# Patient Record
Sex: Female | Born: 1955
Health system: Southern US, Community
[De-identification: ages and names within clinical notes are randomized; demographics above are authoritative.]

## PROBLEM LIST (undated history)

## (undated) DIAGNOSIS — C801 Malignant (primary) neoplasm, unspecified: Secondary | ICD-10-CM

## (undated) DIAGNOSIS — M549 Dorsalgia, unspecified: Secondary | ICD-10-CM

## (undated) DIAGNOSIS — F419 Anxiety disorder, unspecified: Secondary | ICD-10-CM

## (undated) DIAGNOSIS — K562 Volvulus: Secondary | ICD-10-CM

## (undated) DIAGNOSIS — M1990 Unspecified osteoarthritis, unspecified site: Secondary | ICD-10-CM

## (undated) DIAGNOSIS — G8929 Other chronic pain: Secondary | ICD-10-CM

## (undated) HISTORY — PX: ELBOW SURGERY: SHX618

## (undated) HISTORY — PX: SKIN CANCER EXCISION: SHX779

## (undated) HISTORY — PX: AUGMENTATION MAMMAPLASTY: SUR837

## (undated) HISTORY — DX: Malignant (primary) neoplasm, unspecified: C80.1

## (undated) HISTORY — PX: TUBAL LIGATION: SHX77

## (undated) HISTORY — DX: Anxiety disorder, unspecified: F41.9

## (undated) HISTORY — PX: OTHER SURGICAL HISTORY: SHX169

## (undated) HISTORY — DX: Unspecified osteoarthritis, unspecified site: M19.90

## (undated) HISTORY — PX: CHOLECYSTECTOMY: SHX55

## (undated) HISTORY — DX: Volvulus: K56.2

## (undated) HISTORY — PX: OVARIAN CYST REMOVAL: SHX89

---

## 2002-02-07 ENCOUNTER — Encounter: Payer: Self-pay | Admitting: Emergency Medicine

## 2002-02-07 ENCOUNTER — Ambulatory Visit (HOSPITAL_COMMUNITY): Admission: RE | Admit: 2002-02-07 | Discharge: 2002-02-07 | Payer: Self-pay | Admitting: Emergency Medicine

## 2002-02-21 ENCOUNTER — Ambulatory Visit (HOSPITAL_COMMUNITY): Admission: RE | Admit: 2002-02-21 | Discharge: 2002-02-21 | Payer: Self-pay | Admitting: Obstetrics and Gynecology

## 2002-02-21 ENCOUNTER — Encounter (INDEPENDENT_AMBULATORY_CARE_PROVIDER_SITE_OTHER): Payer: Self-pay | Admitting: *Deleted

## 2002-04-28 ENCOUNTER — Emergency Department (HOSPITAL_COMMUNITY): Admission: EM | Admit: 2002-04-28 | Discharge: 2002-04-28 | Payer: Self-pay | Admitting: Emergency Medicine

## 2002-04-28 ENCOUNTER — Encounter: Payer: Self-pay | Admitting: Emergency Medicine

## 2002-11-06 ENCOUNTER — Other Ambulatory Visit: Admission: RE | Admit: 2002-11-06 | Discharge: 2002-11-06 | Payer: Self-pay | Admitting: Obstetrics and Gynecology

## 2003-05-15 ENCOUNTER — Encounter: Admission: RE | Admit: 2003-05-15 | Discharge: 2003-05-15 | Payer: Self-pay | Admitting: Obstetrics and Gynecology

## 2003-05-21 ENCOUNTER — Encounter: Admission: RE | Admit: 2003-05-21 | Discharge: 2003-05-21 | Payer: Self-pay | Admitting: Obstetrics and Gynecology

## 2003-11-12 ENCOUNTER — Other Ambulatory Visit: Admission: RE | Admit: 2003-11-12 | Discharge: 2003-11-12 | Payer: Self-pay | Admitting: Obstetrics and Gynecology

## 2004-05-08 HISTORY — PX: JOINT REPLACEMENT: SHX530

## 2004-11-14 ENCOUNTER — Ambulatory Visit (HOSPITAL_COMMUNITY): Admission: RE | Admit: 2004-11-14 | Discharge: 2004-11-14 | Payer: Self-pay | Admitting: Emergency Medicine

## 2004-12-07 ENCOUNTER — Other Ambulatory Visit: Admission: RE | Admit: 2004-12-07 | Discharge: 2004-12-07 | Payer: Self-pay | Admitting: Obstetrics and Gynecology

## 2005-03-23 ENCOUNTER — Ambulatory Visit (HOSPITAL_COMMUNITY): Admission: RE | Admit: 2005-03-23 | Discharge: 2005-03-23 | Payer: Self-pay | Admitting: Emergency Medicine

## 2005-09-30 ENCOUNTER — Ambulatory Visit (HOSPITAL_COMMUNITY): Admission: RE | Admit: 2005-09-30 | Discharge: 2005-09-30 | Payer: Self-pay | Admitting: Specialist

## 2006-02-05 ENCOUNTER — Encounter: Admission: RE | Admit: 2006-02-05 | Discharge: 2006-02-05 | Payer: Self-pay | Admitting: Obstetrics and Gynecology

## 2006-10-02 ENCOUNTER — Inpatient Hospital Stay (HOSPITAL_COMMUNITY): Admission: RE | Admit: 2006-10-02 | Discharge: 2006-10-03 | Payer: Self-pay | Admitting: Orthopedic Surgery

## 2006-11-19 ENCOUNTER — Encounter: Admission: RE | Admit: 2006-11-19 | Discharge: 2006-11-19 | Payer: Self-pay | Admitting: Emergency Medicine

## 2007-05-09 HISTORY — PX: WRIST SURGERY: SHX841

## 2007-06-08 ENCOUNTER — Emergency Department (HOSPITAL_COMMUNITY): Admission: EM | Admit: 2007-06-08 | Discharge: 2007-06-08 | Payer: Self-pay | Admitting: Emergency Medicine

## 2007-06-09 ENCOUNTER — Observation Stay (HOSPITAL_COMMUNITY): Admission: AD | Admit: 2007-06-09 | Discharge: 2007-06-10 | Payer: Self-pay | Admitting: Obstetrics & Gynecology

## 2007-06-13 ENCOUNTER — Encounter: Admission: RE | Admit: 2007-06-13 | Discharge: 2007-06-13 | Payer: Self-pay | Admitting: Emergency Medicine

## 2007-06-26 ENCOUNTER — Other Ambulatory Visit: Admission: RE | Admit: 2007-06-26 | Discharge: 2007-06-26 | Payer: Self-pay | Admitting: Interventional Radiology

## 2007-06-26 ENCOUNTER — Encounter: Admission: RE | Admit: 2007-06-26 | Discharge: 2007-06-26 | Payer: Self-pay | Admitting: Emergency Medicine

## 2007-06-26 ENCOUNTER — Encounter (INDEPENDENT_AMBULATORY_CARE_PROVIDER_SITE_OTHER): Payer: Self-pay | Admitting: Interventional Radiology

## 2007-09-22 ENCOUNTER — Emergency Department (HOSPITAL_COMMUNITY): Admission: EM | Admit: 2007-09-22 | Discharge: 2007-09-22 | Payer: Self-pay | Admitting: Emergency Medicine

## 2007-09-23 ENCOUNTER — Inpatient Hospital Stay (HOSPITAL_COMMUNITY): Admission: EM | Admit: 2007-09-23 | Discharge: 2007-09-25 | Payer: Self-pay | Admitting: Emergency Medicine

## 2007-09-23 ENCOUNTER — Ambulatory Visit: Payer: Self-pay | Admitting: Family Medicine

## 2007-10-12 ENCOUNTER — Ambulatory Visit (HOSPITAL_BASED_OUTPATIENT_CLINIC_OR_DEPARTMENT_OTHER): Admission: RE | Admit: 2007-10-12 | Discharge: 2007-10-12 | Payer: Self-pay | Admitting: Emergency Medicine

## 2007-11-21 ENCOUNTER — Emergency Department (HOSPITAL_COMMUNITY): Admission: EM | Admit: 2007-11-21 | Discharge: 2007-11-22 | Payer: Self-pay | Admitting: Emergency Medicine

## 2007-11-21 ENCOUNTER — Emergency Department (HOSPITAL_BASED_OUTPATIENT_CLINIC_OR_DEPARTMENT_OTHER): Admission: EM | Admit: 2007-11-21 | Discharge: 2007-11-21 | Payer: Self-pay | Admitting: Emergency Medicine

## 2008-04-20 ENCOUNTER — Ambulatory Visit: Payer: Self-pay | Admitting: Diagnostic Radiology

## 2008-04-20 ENCOUNTER — Ambulatory Visit (HOSPITAL_BASED_OUTPATIENT_CLINIC_OR_DEPARTMENT_OTHER): Admission: RE | Admit: 2008-04-20 | Discharge: 2008-04-20 | Payer: Self-pay | Admitting: Emergency Medicine

## 2008-08-19 ENCOUNTER — Ambulatory Visit (HOSPITAL_BASED_OUTPATIENT_CLINIC_OR_DEPARTMENT_OTHER): Admission: RE | Admit: 2008-08-19 | Discharge: 2008-08-19 | Payer: Self-pay | Admitting: Obstetrics and Gynecology

## 2008-08-19 ENCOUNTER — Ambulatory Visit: Payer: Self-pay | Admitting: Diagnostic Radiology

## 2008-09-07 ENCOUNTER — Encounter: Admission: RE | Admit: 2008-09-07 | Discharge: 2008-09-07 | Payer: Self-pay | Admitting: Gastroenterology

## 2009-03-05 ENCOUNTER — Emergency Department (HOSPITAL_BASED_OUTPATIENT_CLINIC_OR_DEPARTMENT_OTHER): Admission: EM | Admit: 2009-03-05 | Discharge: 2009-03-06 | Payer: Self-pay | Admitting: Emergency Medicine

## 2009-07-14 ENCOUNTER — Ambulatory Visit: Payer: Self-pay | Admitting: Interventional Radiology

## 2009-07-14 ENCOUNTER — Ambulatory Visit (HOSPITAL_BASED_OUTPATIENT_CLINIC_OR_DEPARTMENT_OTHER): Admission: RE | Admit: 2009-07-14 | Discharge: 2009-07-14 | Payer: Self-pay | Admitting: Emergency Medicine

## 2009-08-08 ENCOUNTER — Emergency Department (HOSPITAL_COMMUNITY): Admission: EM | Admit: 2009-08-08 | Discharge: 2009-08-08 | Payer: Self-pay | Admitting: Emergency Medicine

## 2009-11-06 ENCOUNTER — Ambulatory Visit (HOSPITAL_COMMUNITY): Admission: RE | Admit: 2009-11-06 | Discharge: 2009-11-06 | Payer: Self-pay | Admitting: Emergency Medicine

## 2009-12-06 HISTORY — PX: BREAST SURGERY: SHX581

## 2010-02-20 ENCOUNTER — Inpatient Hospital Stay (HOSPITAL_COMMUNITY): Admission: EM | Admit: 2010-02-20 | Discharge: 2010-03-02 | Payer: Self-pay | Admitting: Emergency Medicine

## 2010-02-20 ENCOUNTER — Encounter: Payer: Self-pay | Admitting: Emergency Medicine

## 2010-02-20 ENCOUNTER — Ambulatory Visit: Payer: Self-pay | Admitting: Diagnostic Radiology

## 2010-02-20 ENCOUNTER — Encounter (INDEPENDENT_AMBULATORY_CARE_PROVIDER_SITE_OTHER): Payer: Self-pay | Admitting: General Surgery

## 2010-02-20 HISTORY — PX: COLON SURGERY: SHX602

## 2010-03-05 ENCOUNTER — Ambulatory Visit: Payer: Self-pay | Admitting: Vascular Surgery

## 2010-03-05 ENCOUNTER — Ambulatory Visit (HOSPITAL_COMMUNITY): Admission: RE | Admit: 2010-03-05 | Discharge: 2010-03-05 | Payer: Self-pay | Admitting: General Surgery

## 2010-03-05 ENCOUNTER — Encounter (INDEPENDENT_AMBULATORY_CARE_PROVIDER_SITE_OTHER): Payer: Self-pay | Admitting: General Surgery

## 2010-03-06 ENCOUNTER — Emergency Department (HOSPITAL_COMMUNITY): Admission: EM | Admit: 2010-03-06 | Discharge: 2010-03-06 | Payer: Self-pay | Admitting: Emergency Medicine

## 2010-03-09 ENCOUNTER — Observation Stay (HOSPITAL_COMMUNITY)
Admission: EM | Admit: 2010-03-09 | Discharge: 2010-03-11 | Payer: Self-pay | Source: Home / Self Care | Admitting: Emergency Medicine

## 2010-05-29 ENCOUNTER — Encounter: Payer: Self-pay | Admitting: Emergency Medicine

## 2010-05-30 ENCOUNTER — Encounter: Payer: Self-pay | Admitting: Emergency Medicine

## 2010-07-19 LAB — COMPREHENSIVE METABOLIC PANEL
ALT: 16 U/L (ref 0–35)
ALT: 16 U/L (ref 0–35)
AST: 20 U/L (ref 0–37)
AST: 24 U/L (ref 0–37)
Albumin: 2.9 g/dL — ABNORMAL LOW (ref 3.5–5.2)
Albumin: 3.1 g/dL — ABNORMAL LOW (ref 3.5–5.2)
CO2: 21 mEq/L (ref 19–32)
Calcium: 8.9 mg/dL (ref 8.4–10.5)
Calcium: 9.1 mg/dL (ref 8.4–10.5)
Creatinine, Ser: 0.62 mg/dL (ref 0.4–1.2)
GFR calc Af Amer: >60 mL/min (ref >60)
GFR calc Af Amer: >60 mL/min (ref >60)
GFR calc non Af Amer: >60 mL/min (ref >60)
Sodium: 136 mEq/L (ref 135–145)
Sodium: 142 mEq/L (ref 135–145)
Total Protein: 6.3 g/dL (ref 6.0–8.3)
Total Protein: 6.3 g/dL (ref 6.0–8.3)

## 2010-07-19 LAB — MRSA CULTURE

## 2010-07-19 LAB — RETICULOCYTES
RBC.: 3.12 MIL/uL — ABNORMAL LOW (ref 3.87–5.11)
Retic Count, Absolute: 78 10*3/uL (ref 19.0–186.0)
Retic Ct Pct: 2.5 % (ref 0.4–3.1)

## 2010-07-19 LAB — CBC
Hemoglobin: 8.9 g/dL — ABNORMAL LOW (ref 12.0–15.0)
Hemoglobin: 9.4 g/dL — ABNORMAL LOW (ref 12.0–15.0)
MCH: 31.7 pg (ref 26.0–34.0)
MCHC: 33.4 g/dL (ref 30.0–36.0)
MCHC: 33.6 g/dL (ref 30.0–36.0)
Platelets: 401 10*3/uL — ABNORMAL HIGH (ref 150–400)
Platelets: 471 10*3/uL — ABNORMAL HIGH (ref 150–400)
RDW: 13.7 % (ref 11.5–15.5)

## 2010-07-19 LAB — DIFFERENTIAL
Eosinophils Absolute: 0.2 10*3/uL (ref 0.0–0.7)
Eosinophils Relative: 3 % (ref 0–5)
Lymphocytes Relative: 33 % (ref 12–46)
Lymphs Abs: 2.4 10*3/uL (ref 0.7–4.0)
Monocytes Relative: 7 % (ref 3–12)

## 2010-07-19 LAB — URINALYSIS, ROUTINE W REFLEX MICROSCOPIC
Ketones, ur: NEGATIVE mg/dL
Nitrite: NEGATIVE
Protein, ur: NEGATIVE mg/dL
Urobilinogen, UA: 0.2 mg/dL (ref 0.0–1.0)
pH: 5.5 (ref 5.0–8.0)

## 2010-07-19 LAB — FERRITIN: Ferritin: 70 ng/mL (ref 10–291)

## 2010-07-19 LAB — IRON AND TIBC: Iron: 14 ug/dL — ABNORMAL LOW (ref 42–135)

## 2010-07-19 LAB — VITAMIN B12: Vitamin B-12: 1524 pg/mL — ABNORMAL HIGH (ref 211–911)

## 2010-07-20 LAB — CBC
HCT: 29.8 % — ABNORMAL LOW (ref 36.0–46.0)
HCT: 25.9 % — ABNORMAL LOW (ref 36.0–46.0)
HCT: 27.7 % — ABNORMAL LOW (ref 36.0–46.0)
HCT: 32.9 % — ABNORMAL LOW (ref 36.0–46.0)
HCT: 40 % (ref 36.0–46.0)
Hemoglobin: 11.5 g/dL — ABNORMAL LOW (ref 12.0–15.0)
Hemoglobin: 13.7 g/dL (ref 12.0–15.0)
MCH: 30.4 pg (ref 26.0–34.0)
MCH: 33 pg (ref 26.0–34.0)
MCH: 33.1 pg (ref 26.0–34.0)
MCHC: 31.9 g/dL (ref 30.0–36.0)
MCHC: 34.4 g/dL (ref 30.0–36.0)
MCV: 95.5 fL (ref 78.0–100.0)
MCV: 95.5 fL (ref 78.0–100.0)
MCV: 95.8 fL (ref 78.0–100.0)
MCV: 95.9 fL (ref 78.0–100.0)
MCV: 96 fL (ref 78.0–100.0)
Platelets: 550 10*3/uL — ABNORMAL HIGH (ref 150–400)
Platelets: 127 10*3/uL — ABNORMAL LOW (ref 150–400)
Platelets: 156 10*3/uL (ref 150–400)
Platelets: 178 10*3/uL (ref 150–400)
Platelets: 262 10*3/uL (ref 150–400)
RBC: 2.7 MIL/uL — ABNORMAL LOW (ref 3.87–5.11)
RBC: 3.48 MIL/uL — ABNORMAL LOW (ref 3.87–5.11)
RDW: 13.4 % (ref 11.5–15.5)
RDW: 12.8 % (ref 11.5–15.5)
RDW: 13.1 % (ref 11.5–15.5)
RDW: 13.3 % (ref 11.5–15.5)
RDW: 12.4 % (ref 11.5–15.5)
WBC: 7.5 10*3/uL (ref 4.0–10.5)
WBC: 5.7 10*3/uL (ref 4.0–10.5)
WBC: 6.7 10*3/uL (ref 4.0–10.5)
WBC: 8.3 10*3/uL (ref 4.0–10.5)
WBC: 7.3 10*3/uL (ref 4.0–10.5)

## 2010-07-20 LAB — BASIC METABOLIC PANEL
BUN: 14 mg/dL (ref 6–23)
BUN: 8 mg/dL (ref 6–23)
BUN: 8 mg/dL (ref 6–23)
BUN: 8 mg/dL (ref 6–23)
BUN: 17 mg/dL (ref 6–23)
CO2: 25 mEq/L (ref 19–32)
CO2: 28 mEq/L (ref 19–32)
Calcium: 9.1 mg/dL (ref 8.4–10.5)
Chloride: 106 mEq/L (ref 96–112)
Chloride: 105 mEq/L (ref 96–112)
Chloride: 111 mEq/L (ref 96–112)
Chloride: 111 mEq/L (ref 96–112)
Chloride: 102 mEq/L (ref 96–112)
Creatinine, Ser: 1.09 mg/dL (ref 0.4–1.2)
Creatinine, Ser: 0.62 mg/dL (ref 0.4–1.2)
Creatinine, Ser: 0.64 mg/dL (ref 0.4–1.2)
Creatinine, Ser: 0.66 mg/dL (ref 0.4–1.2)
GFR calc Af Amer: >60 mL/min (ref >60)
GFR calc Af Amer: >60 mL/min (ref >60)
GFR calc Af Amer: >60 mL/min (ref >60)
GFR calc non Af Amer: >60 mL/min (ref >60)
GFR calc non Af Amer: >60 mL/min (ref >60)
GFR calc non Af Amer: >60 mL/min (ref >60)
GFR calc non Af Amer: >60 mL/min (ref >60)
GFR calc non Af Amer: >60 mL/min (ref >60)
Glucose, Bld: 93 mg/dL (ref 70–99)
Glucose, Bld: 109 mg/dL — ABNORMAL HIGH (ref 70–99)
Glucose, Bld: 108 mg/dL — ABNORMAL HIGH (ref 70–99)
Potassium: 4.2 mEq/L (ref 3.5–5.1)
Potassium: 3.1 mEq/L — ABNORMAL LOW (ref 3.5–5.1)
Potassium: 3.9 mEq/L (ref 3.5–5.1)
Potassium: 4.2 mEq/L (ref 3.5–5.1)
Potassium: 3.7 mEq/L (ref 3.5–5.1)
Sodium: 139 mEq/L (ref 135–145)
Sodium: 139 mEq/L (ref 135–145)
Sodium: 136 mEq/L (ref 135–145)

## 2010-07-20 LAB — DIFFERENTIAL
Basophils Absolute: 0 10*3/uL (ref 0.0–0.1)
Basophils Absolute: 0.1 10*3/uL (ref 0.0–0.1)
Eosinophils Absolute: 0.3 10*3/uL (ref 0.0–0.7)
Eosinophils Relative: 3 % (ref 0–5)
Eosinophils Relative: 8 % — ABNORMAL HIGH (ref 0–5)
Lymphocytes Relative: 39 % (ref 12–46)
Lymphocytes Relative: 39 % (ref 12–46)
Monocytes Absolute: 0.7 10*3/uL (ref 0.1–1.0)
Monocytes Absolute: 0.8 10*3/uL (ref 0.1–1.0)
Monocytes Relative: 10 % (ref 3–12)
Neutro Abs: 2.9 10*3/uL (ref 1.7–7.7)

## 2010-07-20 LAB — COMPREHENSIVE METABOLIC PANEL
ALT: 19 U/L (ref 0–35)
AST: 29 U/L (ref 0–37)
CO2: 23 mEq/L (ref 19–32)
Chloride: 105 mEq/L (ref 96–112)
GFR calc Af Amer: >60 mL/min (ref >60)
GFR calc non Af Amer: >60 mL/min (ref >60)
Glucose, Bld: 209 mg/dL — ABNORMAL HIGH (ref 70–99)
Sodium: 136 mEq/L (ref 135–145)
Total Bilirubin: 0.8 mg/dL (ref 0.3–1.2)

## 2010-07-20 LAB — URINALYSIS, ROUTINE W REFLEX MICROSCOPIC
Bilirubin Urine: NEGATIVE
Glucose, UA: NEGATIVE mg/dL
Hgb urine dipstick: NEGATIVE
Ketones, ur: NEGATIVE mg/dL
Specific Gravity, Urine: 1.012 (ref 1.005–1.030)
pH: 6 (ref 5.0–8.0)

## 2010-07-20 LAB — MRSA PCR SCREENING: MRSA by PCR: NEGATIVE

## 2010-07-20 LAB — POTASSIUM: Potassium: 3.7 mEq/L (ref 3.5–5.1)

## 2010-07-20 LAB — TYPE AND SCREEN

## 2010-08-11 LAB — WOUND CULTURE: Gram Stain: NONE SEEN

## 2010-08-11 LAB — CULTURE, BLOOD (ROUTINE X 2): Culture: NO GROWTH

## 2010-08-11 LAB — CBC
HCT: 44.4 % (ref 36.0–46.0)
Hemoglobin: 15.3 g/dL — ABNORMAL HIGH (ref 12.0–15.0)
RDW: 11.8 % (ref 11.5–15.5)
WBC: 11.5 10*3/uL — ABNORMAL HIGH (ref 4.0–10.5)

## 2010-08-11 LAB — DIFFERENTIAL
Basophils Absolute: 0 10*3/uL (ref 0.0–0.1)
Eosinophils Relative: 1 % (ref 0–5)
Lymphocytes Relative: 30 % (ref 12–46)
Lymphs Abs: 3.4 10*3/uL (ref 0.7–4.0)
Monocytes Absolute: 0.7 10*3/uL (ref 0.1–1.0)
Neutro Abs: 7.3 10*3/uL (ref 1.7–7.7)

## 2010-08-11 LAB — BASIC METABOLIC PANEL
Calcium: 9.6 mg/dL (ref 8.4–10.5)
GFR calc Af Amer: >60 mL/min (ref >60)
GFR calc non Af Amer: >60 mL/min (ref >60)
Potassium: 3.2 mEq/L — ABNORMAL LOW (ref 3.5–5.1)
Sodium: 151 mEq/L — ABNORMAL HIGH (ref 135–145)

## 2010-09-20 NOTE — H&P (Signed)
Beth Frey, MCCLARAN NO.:  [id number]   MEDICAL RECORD NO.:  [id number]          PATIENT TYPE:  INP   LOCATION:  2033                         FACILITY:  [facility]   PHYSICIAN:  Beth Frey, M.D.DATE OF BIRTH:  [birth year]   DATE OF ADMISSION:  09/23/2007  DATE OF DISCHARGE:                              HISTORY & PHYSICAL   PRIMARY CARE Shiloh Swopes:  Dr. Andee Poles at [facility].   CHIEF COMPLAINT:  Near syncope and increased blood pressure.   HISTORY OF PRESENTING ILLNESS:  The patient is a 55 year old previously  healthy woman who had an episode of lightheadedness yesterday while  she was singing in the church choir.  There was no loss of  consciousness, chest pain, or shortness of breath, but she did feel  nauseous during the episode.  Thinking that she might have low blood  sugar, she drank a Pepsi, but her symptoms did not improve.  A friend at  church checked her blood pressure, which showed a systolic of over 425.  EMS was called and found then her systolic to be between 160 and 180.  The patient was taken to [facility] where blood pressure was 193/105  and later found to be 132/81.  It was not clear if she received any  medicine contributing to the decrease of her blood pressure or if it  came down spontaneously.  Labs done at [facility] yesterday were within  normal limits with the exception of a potassium of 2.8 and EKG was  normal.  A UA was normal.  Head CT without contrast was normal with the  exception of mucosal thickening and partial opacification of the basal  aspect of the maxillary sinus.  Today, the patient had another episode  of lightheadedness and called her primary care Saran Laviolette, Dr. Andee Poles, who  instructed her to call EMS and EMS subsequently brought her to the  emergency department.   PAST MEDICAL HISTORY:  Goiter, but thyroid studies have always been  normal per the patient.  She also has a history of ovarian cyst; several  of which  have ruptured.   PAST SURGICAL HISTORY:  Include knee replacement, bilateral tubal  ligation, laparoscopy, uterine ablation, multiple basal and squamous  cell carcinomas have been removed from the skin.   MEDICINE ALLERGIES:  Vancomycin, which causes a rash.   CURRENT MEDICATIONS:  1. Fish oil 1 tablet daily.  2. Calcium 1 tablet daily.  3. Vitamin D 1 tablet daily.  4. Hormone replacement therapy called Angeliq; however, the patient      self-discontinued that several days ago.   SOCIAL HISTORY:  The patient is married.  Of note, she is married to ER  physician, Dr. Read Drivers.   OCCUPATION:  Has worked in the past as an Insurance underwriter.  No tobacco, no  drugs, and no alcohol.   FAMILY HISTORY:  Stomach cancer in the mother and a sibling with  multiple myeloma.   REVIEW OF SYSTEMS:  Positive for HPI and also positive for recent hair  loss, and some aching in her left arm, lower  back pain, and abdominal  bloating, otherwise review of systems is negative.   PHYSICAL EXAMINATION:  VITAL SIGNS:  Temperature ranging from 97.5 to  97.6, pulse 68 to 70, respirations 18 to 20.  Her initial blood pressure  was 140/70 and then decreased to 139/86.  Pulse ox was 100% on room air.  GENERAL:  The patient was anxious, but no acute distress.  HEENT:  Head was normocephalic, atraumatic.  Pupils were equally round  and reactive to light.  Extraocular movements were intact.  Her  oropharynx was clear.  Her neck was supple without lymphadenopathy.  CARDIAC:  Regular rate and rhythm with no murmurs, rubs, or gallops  noted.  LUNGS:  Clear to auscultation bilaterally.  ABDOMEN:  Soft.  There was mild tenderness to palpation in the  epigastrium.  No organomegaly.  Normoactive bowel sounds.  BACK:  No  scoliosis or other abnormalities.  EXTREMITIES:  No clubbing, cyanosis, or edema.  NEURO:  No focal deficit, normal gait, and normal strength.  The patient  was alert and oriented x3.  MUSCULOSKELETAL:   5/5 strength in the upper and lower extremities.   LABS AND STUDIES:  All of his labs are from yesterday from [facility],  include electrolytes which were within normal limits with the following  exception, potassium of 2.8, glucose of 137; however, this was not a  fasting glucose presumably.  CBC was within normal limits with a white  blood cell count of 6.6, hemoglobin of 14.1, hematocrit 40.9, and  platelets of 196.  UA shows specific gravity of 1.003, but was otherwise  within normal limits.  Point of care cardiac enzymes were negative.   ASSESSMENT/PLAN:  The patient is a 55 year old with lightheadedness and  labile blood pressure.  1. Lightheadedness and labile blood pressure.  2. Differential diagnosis includes pheochromocytoma, and vascular exam      such as stroke with reactionary elevation of blood pressure, panic      attacks, carcinoid syndrome or hyperadrenergic spells.  3. We will plan to check a 24-hour fractionated urine metanephrines      and catecholamines and urine creatinine to screen for      pheochromocytoma.  If those tests were positive, we will plan to      proceed with a CAT scan to look for pheochromocytoma.  4. We will also check an MRA/MRI to rule cardiovascular accident with      resultant increasing blood pressure.  5. We will place the patient on telemetry.  6. We will treat blood pressure with p.r.n. labetalol.  7. We will check a.m. labs including a CMP and TSH.  Anxiety, the      patient is anxious over the cause of her symptoms.  We will give      Ativan 1 mg t.i.d. p.r.n. for now.  8. Hypokalemia.  The patient was [facility]alemic [facility], we will replete that as necessary and recheck potassium in      the morning.      Asher Muir, MD  Electronically Signed      Beth Bumpers. Leveda Anna, M.D.  Electronically Signed    SO/MEDQ  D:  09/24/2007  T:  09/24/2007  Job:  [id number]

## 2010-09-20 NOTE — Consult Note (Signed)
NAME:  Beth Frey, Beth Frey NO.:  [id number]   MEDICAL RECORD NO.:  [id number]          PATIENT TYPE:  EMS   LOCATION:  ED                           FACILITY:  [facility]   PHYSICIAN:  Bernette Redbird, M.D.   DATE OF BIRTH:  1955-12-05   DATE OF CONSULTATION:  06/08/2007  DATE OF DISCHARGE:                                 CONSULTATION   REASON FOR CONSULTATION:  Dr. Bethann Frey of the [facility] emergency  room asked me to see this 55 year old female, the wife of Dr. Brock Bad of the ER staff, because of abdominal pain and an abnormal  radiographic appearance of the abdomen.   Beth Frey has generally enjoyed good health in the past but has had ruptured  ovarian cysts on 2 previous occasions, one of them necessitating surgery  with a Pfannenstiel incision many years ago.  She is 55 years old, has  been on long term hormone replacement therapy, and it is not really  clear whether or not she has been menopausal.   With that background, she was in her usual state of health until  approximately 2 days ago when she went to urgent medical care and was  started on lisinopril for elevated blood pressure.  That evening she  developed some slight abdominal discomfort and a few episodes of watery  diarrhea.  The next day (yesterday) she had a little more watery  diarrhea and the pain began, along with nausea, regurgitation, rumbling  in her abdomen, sense of bone aches and weakness, and by last night  she was having a steady progression in the intensity of the pain to the  point where finally it became excruciating.  It was sharp and rather  diffuse in character.  No associated fevers or chills.  She did not feel  like eating.  She came to the emergency room this morning and took a lot  of pain medication but finally, over the course of roughly 8 hours in  the emergency room, her pain has tapered off to the point where she is  feeling much better at this time.   In the meantime,  an abdominal pelvic CT was obtained which showed a fair  amount of pelvic free fluid without any obvious ovarian abnormality or  cyst identified.  No other abdominal pelvic abnormalities to account for  pain or diarrhea or fluid in the abdomen were observed.  I have reviewed  the CT with Dr. Eppie Gibson of radiology and pertinent negatives include the  absence of hepatic vein thrombosis, portal vein thrombosis, cirrhotic  changes in the liver, splenomegaly, colitis or bowel obstruction.  No  evidence of pancreatitis, no evidence of lymphadenopathy.   PAST MEDICAL HISTORY:  No known allergies (had a red man reaction to  vancomycin many years ago).  Outpatient medications include recently  started lisinopril, low dose hormone replacement therapy, calcium and  vitamin supplements, fish oil and multiple vitamins.  Operations include  a previous total knee replacement last May, tubal ligation, uterine  ablation with laparoscopy looking for endometriosis (apparently  negative), and an open  pelvic exploration many years ago for an apparent  ruptured ovarian cyst.  Medical illnesses include recently diagnosed  high blood pressure, no cardiopulmonary disease or diabetes.   HABITS:  Nonsmoker, nondrinker.   FAMILY HISTORY:  Her father had cancer of the stomach or  gastroesophageal junction but otherwise negative for GI illnesses such  as liver disease, celiac disease, inflammatory bowel disease, ulcers or  gallbladder trouble.   SOCIAL HISTORY:  The patient is a Engineer, civil (consulting).  At this time she is not  employed outside the home.  Her husband is Dr. Brock Bad of the  emergency room staff.  They have no children.   REVIEW OF SYSTEMS:  Negative for antecedent chronic intestinal symptoms  such as loss of appetite, involuntary weight loss, dysphagia, nausea,  abdominal pain, constipation or diarrhea.   PHYSICAL EXAMINATION:  GENERAL:  Beth Frey is a lean, healthy-appearing  female in no evident distress.  She  looks a little bit anxious but is  able to laugh gently.  She does appear slightly uncomfortable from  residual abdominal pain.  VITAL SIGNS:  During her day in the emergency room she has been  repeatedly afebrile.  Blood pressure has been running variable, at  142/112 on admission, more recently 114/59 and her heart rate has  dropped from 117-86 over that period of time, presumably as a result of  less pain.  HEENT:  She is anicteric and without pallor.  CHEST:  The chest is clear to auscultation.  HEART:  The heart is normal without gallops, rubs, murmurs, clicks or  arrhythmias.  ABDOMEN:  The abdomen is unimpressive.  It is nondistended.  It is not  tympanitic.  Sparse bowel sounds are present.  No organomegaly or mass  effect is appreciated.  There is some subjective abdominal tenderness,  but nothing objective such as significant guarding, peritoneal findings,  rebound, etc.  RECTAL:  Exam showed a mostly empty rectal ampulla with a small smudge  of mucoid brown Hemoccult negative stool and residue (bedside exam,  controls reacted appropriately).   LABS:  White count 9300 with 60 polys, 33 lymphs, 7 monocytes, 1  eosinophil.  Hemoglobin 13.5, platelet count 203,000.  Chemistry panel  pertinent for total bilirubin 0.9, alkaline phosphatase low normal at  16, AST 35, ALT minimally elevated at 44, albumin 3.3, lipase normal at  26.  Urinalysis clear.   CT scan, see above, reviewed with radiologist.   IMPRESSION:  1. Improving diffuse abdominal pain.  2. Pelvic free fluid.  3. Previous history of ruptured ovarian cyst.  4. Minimal elevation of ALT.  5. Appropriate for colon cancer screening by virtue of being age 55.   RECOMMENDATIONS:  1. I do think it is okay for the patient to go home from the emergency      room.  They live nearby, her husband is a physician and he does not      have to work tonight, they are reliable and there has been a      progressive improving trend  on her symptoms.  2. They know to call in the event of worsening symptoms, otherwise      they will call me in the morning with a progress report.  If there      is any question about improvement or not, we will have repeat CBC      obtained tomorrow.  3. I have encouraged the patient to follow up with her gynecologist,      Dr.  [employer].  It is not clear that this is an ovarian cyst but      I think it is worth having her evaluate with that possibility.  4. Prescriptions were provided after discussion of various medications      and all medication alternatives.  For pain, I have given a      prescription for generic Darvocet-N 100 #20 to use one p.o. q.4 h.      p.r.n. pain and to help with sleep she finds that Valium has worked      well for her in the past so a prescription for generic Valium 5 mg      #10 to use one to two p.o. daily at bedtime p.r.n. sleep with no      refills was provided.  5. The patient is aware that she is an appropriate candidate for colon      cancer screening although she would like to hold off on that for      the foreseeable future.  6. With respect to the elevation of ALT, I doubt that this is      clinically significant but I have encouraged the      patient and her husband to have it rechecked sometime in the next 6      months to make sure it is not a persistent finding, in which case      checking the usual tests such as a hepatitis C serology would      probably be appropriate.   I appreciate the opportunity to have seen this patient.           ______________________________  Bernette Redbird, M.D.     RB/MEDQ  D:  06/08/2007  T:  06/09/2007  Job:  [id number]   cc:   Randye Lobo, M.D.  Fax: [phone]   Stan Head. Cleta Alberts, M.D.  Fax: [phone]   Beth Berkshire, MD  [address] [place], [place] [address]

## 2010-09-20 NOTE — Op Note (Signed)
NAME:  Beth Frey, Beth Frey NO.:  [id number]   MEDICAL RECORD NO.:  [id number]          PATIENT TYPE:  EMS   LOCATION:  ED                           FACILITY:  [facility]   PHYSICIAN:  Antony Contras, MD     DATE OF BIRTH:  02/06/1956   DATE OF PROCEDURE:  11/22/2007  DATE OF DISCHARGE:  11/22/2007                               OPERATIVE REPORT   PREOPERATIVE DIAGNOSIS:  Left forehead and brow laceration.   POSTOPERATIVE DIAGNOSIS:  Left forehead and brow laceration.   PROCEDURE:  Intermediate complexity closure of forehead, brow, and  eyelid laceration on the left, total length 5 cm.   SURGEON:  Excell Seltzer. Jenne Pane, MD.   ANESTHESIA:  Local.   COMPLICATIONS:  None.   INDICATIONS FOR PROCEDURE:  The patient is a 55 year old white female  who fell earlier this evening sustaining a laceration to the left  forehead, brow, and upper eyelid.  It measures 5 cm in total and has a  90 degree turn in the middle.  It created a flap with lateral pedicle.   FINDINGS:  The lacerations as described above.   DESCRIPTION OF PROCEDURE:  The patient was identified in the emergency  department.  Informed consent was obtained.  The left upper face was  prepped and draped in sterile fashion.  The laceration was injected with  1% Lidocaine with 1:100,000 epinephrine.  The laceration was then  copiously irrigated with saline.  The subcutaneous layer was closed  using 5-0 Vicryl in a simple interrupted fashion. The skin was then  closed using 5-0 Prolene in the brow and forehead in a simple running  fashion, and then 6-0 Prolene in the upper eyelid in a simple running  fashion.  After this, antibacterial ointment was added.  The patient was  then returned to emergency room care.      Antony Contras, MD  Electronically Signed     DDB/MEDQ  D:  11/22/2007  T:  11/22/2007  Job:  [id number]

## 2010-09-20 NOTE — Consult Note (Signed)
NAME:  Beth Frey, Beth Frey NO.:  [id number]   MEDICAL RECORD NO.:  [id number]          PATIENT TYPE:  EMS   LOCATION:  ED                           FACILITY:  [facility]   PHYSICIAN:  Antony Contras, MD     DATE OF BIRTH:  [birth year]   DATE OF CONSULTATION:  11/22/2007  DATE OF DISCHARGE:                                 CONSULTATION   CHIEF COMPLAINT:  Left brow laceration.   HISTORY OF PRESENT ILLNESS:  The patient is a 55 year old white female  who was tired earlier this evening and had some alcohol.  She fell  striking her left brow and sustaining a laceration.  She was also found  to have a fractured right clavicle.  A head CT was negative.  She  stopped aspirin earlier this week.  She has no other complaints  currently.  The patient's pain was 8/10 at its worst, but is now 2/10.   PAST MEDICAL HISTORY:  1. Anxiety.  2. Depression.  3. Goiter.  4. Ovarian cyst.   PAST SURGICAL HISTORY:  1. Knee replacement.  2. Laparoscopy.  3. Tubal ligation.  4. Uterine ablation.   MEDICATIONS:  1. Fish oil.  2. Calcium.  3. Vitamin D.  4. Zoloft.  5. Clonidine.  6. Ativan.   ALLERGIES:  VANCOMYCIN.   FAMILY HISTORY:  None significant.   SOCIAL HISTORY:  The patient is an occasional drinker.  Denies smoking  or drug abuse.   REVIEW OF SYSTEMS:  Negative except as listed above.   PHYSICAL EXAMINATION:  VITAL SIGNS:  Temperature 98.2, blood pressure  101/74, pulse 94.  GENERAL:  The patient is in no acute distress and is pleasant and  cooperative.  VOICE:  The voice is normal.  EYES:  Extraocular movements are intact.  Pupils equal, round, and  reactive to light.  NOSE:  External nose is normal.  Nasal passages are patent.  The septum  is relatively midline.  ORAL CAVITY/OROPHARYNX:  There is no lesion in these areas.  The teeth,  lips, and gums are normal.  The tongue and floor of mouth are normal.  The oropharynx is normal.  EARS:  External ears are normal.   The right canal has a cerumen  impaction.  The left canal is without cerumen and the tympanic membranes  are intact in the middle ear area.  FACE:  There is a   INCOMPLETE      Antony Contras, MD  Electronically Signed     DDB/MEDQ  D:  11/22/2007  T:  11/22/2007  Job:  [id number]

## 2010-09-20 NOTE — Discharge Summary (Signed)
Beth Frey, Beth Frey NO.:  [id number]   MEDICAL RECORD NO.:  [id number]          PATIENT TYPE:  INP   LOCATION:  2033                         FACILITY:  [facility]   PHYSICIAN:  Beth Frey, M.D.DATE OF BIRTH:  [birth year]   DATE OF ADMISSION:  09/23/2007  DATE OF DISCHARGE:  09/25/2007                               DISCHARGE SUMMARY   PRIMARY CARE Beth Frey:  Beth Canales A. Cleta Alberts, MD at [facility].   CONSULTANTS:  Melvyn Novas, MD with [facility].   PROCEDURES:  None.   REASON FOR ADMISSION:  The patient is a 55 year old previously healthy  woman who had an episode of lightheadedness and was found to have a  blood pressure with systolics over 200 and diastolic in the high 100  several days prior to admission.  The day of admission, she had a  similar episode, called her primary care Beth Frey Dr. Cleta Frey and he  instructed her to call EMS.  Of note after the first episode, the  patient went to the [facility] ER, where she was noted to have a blood  pressure of 193/105, but later this returned to normal range at 132/81.  In our emergency room on the day of admission, her blood pressure was  139-140 over 70-86.   DISCHARGE DIAGNOSES:  1. Paroxysmal elevations in blood pressure.  2. Hypokalemia.   LABS AND STUDIES:  Of note the day prior to admission to the hospital  when the patient had been at [facility] ER, she was noted to have a  potassium of 2.8.  Otherwise, her electrolytes were within normal limits  and her CBC was within normal limits with a hemoglobin of 14.1,  hematocrit 40.9, a white blood cell count of 6.6, and differential was  within normal limits.  A urinalysis also done the day prior to admission  was within normal limits with the exception of specific gravity of  1.003.  An EKG done upon admission at [facility] on the Sep 23, 2007,  was showed normal sinus rhythm.  No abnormalities were noted.  Repeat  BMET was done  at [facility], which was completely within normal limits  including a potassium of 3.7, but this was after a number of supplements  had been given after she had been found to have the potassium of 2.8 at  the [facility] ED.  TSH was measured, which was normal at 1.139.  A 24-  hour urine was started for fractionated metanephrines and  catecholamines, which is pending at the time of this dictation.  Liver  functions were measured and they showed a total bili of 0.6 and alkaline  phosphatase of 19, AST elevated at 49, and ALT elevated at 75.  Repeat  of LFTs on Sep 25, 2007, were all within normal limits again with  exception of an elevated AST of 43 and an elevated ALT of 70.  An MRI  and MRA of the brain and head was done, which showed the following:  1.  No acute infarct, slow flow versus occluded right transverse sinus and  sigmoid sinus, however, this was incompletely evaluated.  Recommendation  to consider MR venography or catheter angiogram to delineate.  2.  Sinus  mucosal thickening in the right maxillary sinus.  1. Question of a prominent infundibulum versus a small left posterior      communicating artery aneurysm just over 2 mm.  4.  Mild narrowing      of the right internal carotid artery.  The precavernous and      supraclinoid segment, however, radiologist noted that this finding      may be due to motion degradation.  Please send a copy of the      complete report at the MRI and MRA brain and head to Dr. Cleta Frey at      [facility].   Discharge medications are as follows:  The patient was discharged on her  home regimen of vitamin D and calcium.  Her previous home regimen of  fish oil.  The only new medication added was a baby aspirin 81 mg p.o.  daily.   HOSPITAL COURSE:  1. Elevated blood pressures.  The patient's blood pressures actually      remained within normal limits during her hospital stay but as      mentioned previously blood pressures in the emergency  department      was 130s-140s over 70s-80s.  Once she was admitted to the floor,      her blood pressure ranged from the 110s-130s over the 60s-80s.      Other vital signs were within normal limits.  The patient was      monitored on telemetry and she was remained in sinus rhythm with      alarm event.  Given the findings of the MRI and MRA, a neuro      consult was requested.  The patient was seen by Dr. Vickey Huger who      stated that there was really no evidence of a stroke.  She also      stated the MRAs can be overread for aneurysm.  In order to be      absolutely sure that there is not an aneurysm, the patient could      have a CT angio; however, the patient declines to have the CT angio      at this time.  Dr. Vickey Huger also stated that slow flow or blockage      of the venous sinuses are not likely to be the cause of her      symptoms.  She did recommend aspirin every other day or Plavix      every other day and also recommended considering renal artery      stenosis in the workup.  However, after discussing with our team,      we think that it is an unlikely cause for her hypertension.  For      one reason the elevated blood pressures have been episodic.      Additionally, the stenosis would have to be bilateral and thirdly,      the patient when she has had some elevated blood pressure in the      past, had taken lisinopril and her blood pressure did respond      appropriately to that, which leaves [place] to believe that renal artery      stenosis is not a strong contender in the differential diagnosis.      An additional consideration was that the patient may have a  pheochromocytoma, so we completed a 24-hour urine to look at      fractionated metanephrines and catecholamines.  At the time of      discharge, those results were pending.  We will notify Dr. Cleta Frey      with those results when the results are returned.  At this time,      the reason for the patient's labile blood  pressure is still not      completely clear.  We will follow up on the results of the 24-hour      urine.  We have recommended to the patient that she monitor her      blood pressure with a home blood pressure cuff several times a day      for the next week.  Another consideration would be primary      aldosteronism, especially given that the patient had a very low      potassium for no apparent reason.  This workup can be done if her      blood pressures continued be high and her potassium continues to be      low and additionally we sent the patient home on one baby aspirin      daily.  2. Elevated LFTs.  The patient was noted to have mildly elevated AST      and ALT as listed above.  At this time, we do not have a good      explanation for that and did not do a full workup of this.  Our      recommendation is to recheck her LFTs in about 2 weeks and if still      abnormal to consider workup at that time.  The patient is      asymptomatic and her exam is normal.  There was no hepatomegaly on      exam.  3. Hypokalemia as noted above.  The patient had an initial potassium      level at the [facility] of 2.8.  She was      given multiple supplements and her potassium level did increase to      within normal limits.  However, the day of discharge it is 3.4. We      are  choosing not to supplement at this time and she probably needs      a repeat potassium in 1-2 weeks and again perhaps consider a workup      for primary aldosteronism if the potassium continues to be low.      Additionally another consideration is during her hospitalization we      did not check a magnesium and this is something else that could be      done as outpatient as low magnesium can cause hypokalemia.   THE PATIENT'S CONDITION AT THE TIME OF DISCHARGE:  Stable.   PENDING TEST RESULTS AT THE TIME OF DISCHARGE:  Results of a 24-hour  urine.   DISPOSITION:  The patient is discharged to  home.   DISCHARGE FOLLOWUP:  The patient is to follow up with her primary care  Ragan Reale Dr. Cleta Frey within a week.  Followup issues are continued  monitoring of her blood pressure, continued monitoring of her potassium,  and further workup as needed, following up with Dr. Cleta Frey by giving him  the test results from a 24-hour urine,  and followup of LFTs to ensure  that those are trending down or returned within normal limits.  Asher Muir, MD  Electronically Signed      Beth Bumpers. Leveda Anna, M.D.  Electronically Signed    SO/MEDQ  D:  09/25/2007  T:  09/26/2007  Job:  [id number]   cc:   Beth Frey, M.D.

## 2010-09-20 NOTE — Consult Note (Signed)
NAME:  Beth Frey, SAGRERO NO.:  [id number]   MEDICAL RECORD NO.:  [id number]          PATIENT TYPE:  EMS   LOCATION:  ED                           FACILITY:  [facility]   PHYSICIAN:  Antony Contras, MD     DATE OF BIRTH:  Jan 30, 1956   DATE OF CONSULTATION:  11/22/2007  DATE OF DISCHARGE:  11/22/2007                                 CONSULTATION   ADDENDUM:   PHYSICAL EXAMINATION:  FACE:  There is a laceration involving the left  eyebrow which runs 2.5 cm horizontally above the eyebrow and then turns  90 degrees and crosses the eyebrow and runs another 2.5 cm into the  upper lid.  The laceration created a flap that was pedicled laterally.  There were no other soft tissue injuries on the face.  There are no bony  abnormalities on the face.  NECK:  The neck is without mass, tenderness or deformity.  LYMPHATICS:  There was no enlargement of the neck.  THYROID:  The thyroid is normal to palpation.  SALIVARY GLANDS:  The salivary glands are normal to palpation.  NEUROLOGIC:  Cranial nerves II through XII grossly intact.  SCALP:  There is a less than 1-cm laceration of the posterior upper  scalp.   ASSESSMENT:  The patient is a 55 year old white female with a left  forehead and eyebrow laceration as described above.   PLAN:  The laceration will be prepared primarily in the emergency  department under local anesthesia.  The patient will then be discharged  from the emergency department.  She will be given instructions for wound  care, including keeping it dry for 2 days, but applying antibacterial  ointment twice daily and using half-strength peroxide for dissolving of  scab.  Sutures will be removed in 5-7 days.      Antony Contras, MD  Electronically Signed     DDB/MEDQ  D:  11/22/2007  T:  11/22/2007  Job:  [id number]

## 2010-09-20 NOTE — Consult Note (Signed)
Beth Frey, Beth Frey NO.:  [id number]   MEDICAL RECORD NO.:  [id number]          PATIENT TYPE:  INP   LOCATION:  2033                         FACILITY:  [facility]   PHYSICIAN:  Melvyn Novas, M.D.  DATE OF BIRTH:  [birth year]   DATE OF CONSULTATION:  09/24/2007  DATE OF DISCHARGE:                                 CONSULTATION   REQUESTING PHYSICIAN:  Santiago Bumpers. 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  She developed suddenly very high blood pressure, and has been concerned  about the associated symptoms.  She explained to me that she had been on  birth control pills for perimenopausal symptoms and was placed on Yaz  until the early month of May.    When she had her first episode of higher blood pressures, she was  asked to discontinue her medicines and then developed severe menopausal  symptoms.  She was then screened by her gynecologist and Estradiol and FSH levels  clearly indicates menopausal status.  She was started in responseto that on hormone replacement therapy, a new  medication named Angeliq. She stated that she had taken this pill before  3 days, when she suddenly on Sunday, while singing in the church choir,  developed a near syncope spell.  She describes that she had to hold onto  objects and that she felt hot and dizzy.  Another choir member and  friend of the patient, who is also an Charity fundraiser, measured her blood pressure at  220/164, and she was brought to the ER by EMS.   She denies any loss of consciousness.  She said she was very nauseated  but never vomited.  Had no diarrhea.  No constipation.  She endorsed  diaphoresis, vertigo, or dizziness and denied any headaches or tremors.  The patient was still showing high blood pressures when she arrived at  [facility].  She was also, according  to her basic metabolic  panel, hypokalemic.  After potassium was replenished and blood pressure medication was given,  she returned home and felt well.  This was until yesterday, Monday, when at 2:00 p.m., she suddenly  developed again severe vertigo and near fainting while shopping at a  [employer].  She became diaphoretic and shaky and returned  to the ER at this time at [facility].   ROS: Left shoulder pain and numbness in all 10 fingers were described,  but she denies any tingling or dysesthesia around the lips, and she  states that she has not felt any weakness.  She also states that she has  some bluish hand and feet, very frequently and that she does not have  Raynaud syndrome.  Her hands were still tingling today she states. the  diaphoresis and shakiness has resolved, she has no nausea, vertigo and  is afebrile. She bruises easily.   SOCIAL HISTORY:  Charity fundraiser, working part time at [employer].  Nonsmoker and  nondrinker.  She used to run several miles a week, but due to a knee replacement that  was necessary 3 years ago,  can no longer do this. She is still  physically active.  No children. Only pet in the home is a cat.   PAST MEDICAL HISTORY:  Knee surgery due to cartilage damage.  She had  various orthopedic procedures, and she was, in February, seen at the ER  with a suspected ruptured ovarian cyst . What showed up on a pelvic CT  was some pelvic ascites, and spots on the liver.  The ascites resolved and it was never clear where it came from.   FAMILY HISTORY:  Her father died at 50 of complications from a gastric  adenocarcinoma.  Her mother is alive and healthy at 45.  Her brother died of multiple  myeloma 2 years ago.  One brother is alive and healthy.  No other siblings.   PHYSICAL EXAMINATION:  VITAL SIGNS:  Today are stabilized.  The  patient's blood pressure 130/70, respiratory rate of 18, and pulse rate  of 69.  Her temperature is 36 degrees Celsius.  She  has no carotid bruit.  No murmur.  LUNGS:  Clear to auscultation.  She is alert and oriented, understandably nervous and anxious.  She  shows no aphasia or apraxia.  No dysarthria and no dysphagia.  Her  cranial nerves are fully intact.  HEENT:  She has pupils that react equal to light and accommodation.  Full extraocular movements.  No nystagmus.  No visual field impairment.  Tongue and uvula are midline.  NECK:  Supple.  She has full range of motion.  No crepitation.  No  paraspinal tenderness.  No hearing deficit.  Motor examination shows  equal strength in all 4 extremities.  No pronator drift.  Normal finger-  to-nose testing.  Equal deep tendon reflexes, downgoing toes to plantar  stimulation.  SENSORY:  Except for the patient's fingers still being numb, there has  been no facial, trunk or hemilateral sensory loss.   I just reviewed the patient's MRI and it describes a possible venous  sinus stasis, but the patient lacks the typical symptoms of severe  headaches or possible seizures, and I am not sure that this is a valid  consideration.    Her MRI, MRA without contrast report discussed possible small  aneurysm.  This would be an aneurysm too small to be likely to bleed and could have  been simply an over-read, given the nature of the MRA process.     PLAN: I would like to asssure her that there has been no stroke  evidence, neither by clinical symptoms nor by MRI. A severe episode of  hypertenson can indeed cause dizzyness, numbness but should be transient  and resolves once blood pressure is controlled.   An antiplatelet agent should be taken for small vessel disease  protection.  Since the patient bruises easily, she has discontinued baby aspirin but  would be willing to take it every other day or every third day or to try  Plavix every other day instead.   Second, I have no explanation for the patient's high blood pressure  except that it might be related to the hormone  replacement therapy.  Concerned I am about the transaminase increase and the hypokalemia,  which is unexplained and in context of the February event of a pelvic  ascites, a neoplasm could cause some of these non neurological  symptoms., A malignancy workup needs to be considered.  The family practice service have already initiated a pheochromocytoma  workup by ordering a 24-hour urine.  I would add a renal artery stenosis  workup and recommend to repeat either an abdominal or pelvic CT, look  for new ascites, obtain liver images to make sure that there are no  possible lesions and also tried to visualize the ovaries and  the adrenal glands.  This cause of  electrolyte abnormalities is not  determined at this point, the patient had not been on potassium lowering  agents.   Please contact me if the patient  has any further question.      Melvyn Novas, M.D.  Electronically Signed     CD/MEDQ  D:  09/24/2007  T:  09/25/2007  Job:  [id number]   cc:   William A. Leveda Anna, M.D.  [place] Cleta Alberts, M.D.

## 2010-09-20 NOTE — Op Note (Signed)
NAMEFIORA, WEILL NO.:  [id number]   MEDICAL RECORD NO.:  [id number]          PATIENT TYPE:  INP   LOCATION:  X008                         FACILITY:  [facility]   PHYSICIAN:  Madlyn Frankel. Charlann Boxer, M.D.  DATE OF BIRTH:  05-Oct-1955   DATE OF PROCEDURE:  10/02/2006  DATE OF DISCHARGE:                               OPERATIVE REPORT   PREOPERATIVE DIAGNOSIS:  Left knee medial compartment osteoarthritis.   POSTOPERATIVE DIAGNOSIS:  Left knee medial compartment osteoarthritis.   PROCEDURE:  Left knee partial knee replacement.   COMPONENTS USED:  Biomet Oxford unicompartmental knee replacement, size  small femur, size A medial left tibia, and a size 3 polyethylene insert.   SURGEON:  Madlyn Frankel. Charlann Boxer, M.D.   ASSISTANT:  Yetta Glassman. Mann, P.A.-C.   ANESTHESIA:  Spinal.   BLOOD LOSS:  None.   TOURNIQUET TIME:  75 minutes at 250 mmHg.   DRAINS:  None.   COMPLICATIONS:  None.   INDICATIONS FOR PROCEDURE:  Beth Frey is a 55 year old female who presented  to the office for evaluation of medial compartment osteoarthritis and  persistent pain despite attempts at conservative measures including  injections and viscus supplementation.  She had two separate  arthroscopies with debridement of chondral defects.  These were  evaluated and indicating a kissing lesion on the medial tibia and  femoral aspect of the joint.   She was asked to be evaluated for partial knee replacement versus total  knee replacement.  We reviewed the risks and benefits of both types of  procedures.  She had already reviewed this information provided.  She  wished to proceed with partial knee replacement.  The risks of  infection, DVT, component failure, need for revision to total knee  replacement were all discussed and consent was obtained.   PROCEDURE IN DETAIL:  The patient was brought to operative theater.  Once adequate anesthesia and preoperative antibiotics, 1 gram Ancef,  were administered,  the patient was positioned supine on the operating  table.  A proximal thigh tourniquet was placed. A bump was placed  underneath the left hip and the left leg was flexed and abducted out and  placed into the Oxford leg holder.  I confirmed that I was able to flex  the knee to 120 degrees for the procedure.  Once this was done, the left  knee was pre-scrubbed and prepped and draped in a sterile fashion from  the lower leg to the tourniquet.  The leg was then exsanguinated and the  tourniquet elevated to 250 mmHg.   The patient's landmarks were identified and a paramidline incision was  made from the patella to the tubercle.  Sharp dissection was carried out  to the extensor mechanism defining the boundaries of the soft tissues  and freeing up the soft tissues to allow for mobilizing the soft  tissues.  A medial arthrotomy was then carried out from the proximal  pole of the patella down to the tubercle.  A medial partial meniscectomy  was carried out for remaining meniscus.  Exposures included some  debridement of the synovium  and fat pad in the anterior aspect of the  knee.  Once the knee was exposed including the proximal portion of the  tibia, I evaluated the knee and the patellofemoral lateral compartment  and was noted to be well maintained as had been noted in arthroscopy  pictures before.  There was also no significant osteophyte that needed  excessive debridement.  I did debride some osteophytes as the procedure  went on, but nothing in the knee required an osteotome at this point.   At this point, the extramedullary jig was utilized and placed over the  proximal portion of the tibia.  Once this position was pinned, I then  used the reciprocating saw first into the notch aiming towards the  femoral head.  I then used an oscillating saw to finish off this cut.  At this point, I sized the cut surface and it fit perfectly with a size  8 cut surface tibia.  I then trialed with the  feeler gauges and felt the  size 3 fit very nicely in this cut surface without retractors.  I was  happy with this.  In case there was any potential stretching or  debridement that further opened this up, I kept this as my cut level.   At this point, I attended to the femur.  The intramedullary starting awl  was used to create a hole of the femur 1 cm above the medial aspect of  the notch.  The intramedullary rod was then passed without difficulty.  Then, with size 8 tibial tray in place and the 2 feeler gauge, I used  the femoral guide.  Once I was happy with the orientation of the femoral  guide in all planes, it was drilled into position.  The drill was then  removed and the posterior cutting block placed.  A posterior cut was  made. Debridement of remaining meniscus was carried out at this time.   I then used a 0 spigot and milled the proximal femur.  I did a trial  reduction with the size small femur and an 8 tibia and a 3 mm insert.  Flexion of the knee came out stable with the 3 mm insert, however in  extension, the knee did not come out to full extension with the 3 and I  was unable to even get the 1 into the knee.  Based on this, I removed  the trials and used a 3 spigot and milled the femur.  I did a repeat  trial reduction and I still felt it was pretty tight in extension, felt  good in flexion, and for this reason, I used a 4 spigot.  I milled the  femur again, debrided osteophytes as well as the center portion, did a  repeat trial reduction.  At this point, with the size 3 insert in  flexion and extension, the knee was stable.   Given all these parameters and following further debridements as  necessary, I went ahead and did the final preparation in the tibial  keel. With the tibial tray in the correct orientation, I used the  reciprocating saw to create the keel area.  I then used the angled trowel device to remove the bone.  Trial reduction was carried out with  the  keeled size A tibial tray, the small femur,  and a 3 mm insert.  In  90 degrees of flexion, there was 1-2 mm play of the poly as was in 20  degrees of flexion  indicating good ligament balance.  The knee came out  to full extension.  She was noted have preop hyperextension and she was  able to maintain that with this procedure.  Anteriorly, just anterior to  the femoral component, I did create a notch to allow for translation of  the tibial polyethylene insert.   At this point, all trials were removed and the final components were  brought on the field, the [address] is a size small femur and A medial left  tibia.  The knee was copiously irrigated with normal saline solution.  At this point, the cement was mixed. The cement was made in two batches,  first cementing the tibia component in position with the trial femur  placed and the knee brought to 45 degrees of flexion.  Excessive cement  debrided. About five minutes into it, the second batch was mixed and  prepared and the femoral component cemented in position, all with a 3  feeler gauge placed.  Excessive cement was debrided as visualized.  Once  the cement had cured, I examined the knee as much as could be possibly  inspected and made sure there was no evidence of any loose cement.  Once  I was satisfied there was none, the final size 3 insert was used.  The  final size 3 insert was then pushed into place with a good snap.  Again,  the knee came out to full extension with some hyperextension passively  with no evidence of abutment of the polyethylene on the femur.   At this point, the wound was reirrigated. I then reapproximated the  extensor mechanism in flexion with #1 Vicryl, 2-0 Vicryl was used in the  subcu layer, and a 4-0 running Monocryl on the skin.  The skin was  cleaned, dried, and dressed sterilely with Steri-Strips and dressing.  She is brought to the recovery room in stable condition.      Madlyn Frankel Charlann Boxer, M.D.   Electronically Signed     MDO/MEDQ  D:  10/02/2006  T:  10/02/2006  Job:  [id number]

## 2010-09-20 NOTE — H&P (Signed)
Beth Frey, Beth Frey                 ACCOUNT NO.:  [id number]   MEDICAL RECORD NO.:  [id number]          PATIENT TYPE:  INP   LOCATION:  NA                           FACILITY:  [facility]   PHYSICIAN:  Madlyn Frankel. Charlann Boxer, M.D.  DATE OF BIRTH:  [birth year]   DATE OF ADMISSION:  10/02/2006  DATE OF DISCHARGE:                              HISTORY & PHYSICAL   PROCEDURE:  Left unicondylar knee replacement.   CHIEF COMPLAINT:  Left knee pain.   HISTORY OF PRESENT ILLNESS:  This is a 55 year old female with a history  of persistent progressive knee pain.  She has a history of prior  arthroscopic debridement and partial meniscectomy.  She has had a grade  4 lesion on the medial compartment.  This has been refractory to all  conservative treatments including oral anti-inflammatories, cortisone  injections and viscosupplementation.  Upon extensive discussion, she has  been cleared for a left partial knee replacement.   PAST MEDICAL HISTORY:  Osteoarthritis and basal squamous cell  abnormalities.   PAST SURGICAL HISTORY:  Ovarian cyst removal 1984; three left knee  arthroscopic surgeries in 1995, 2002 and 2006; left elbow tendin repair  2005; multiple skin excisions for skin cell abnormalities; uterine  ablation in 2002.   FAMILY HISTORY:  Gastric cancer.   SOCIAL HISTORY:  Married, retired [employer] fundraiser.  Primary caregiver will be her  husband, Dr. Maurice March Molpus.   DRUG ALLERGIES:  VANCOMYCIN.   MEDICATIONS:  1. Yaz 3 mg/0.02.  2. Osteo-Bi-Flex 2 per day.  3. Calcium with vitamin D 1500/1000 two daily.  4. Omega fish oil 1200 mg two daily.  5. Diazepam 5 mg p.o. p.r.n.   REVIEW OF SYSTEMS:  None other than HPI.   PHYSICAL EXAMINATION:  Pulse 72, respirations 18, blood pressure 134/84.  GENERAL:  She is awake, alert and oriented, well-developed, well-  nourished and in no acute distress.  NECK:  No carotid bruits.  Supple.  CHEST/LUNGS:  Clear to auscultation bilaterally.  BREASTS:  Deferred.  HEART:  Regular rate and rhythm without gallops, clicks, rubs or  murmurs.  ABDOMEN:  Soft, nontender, nondistended.  Bowel sounds present.  GENITOURINARY:  Deferred.  EXTREMITIES:  Knees medial-sided tenderness.  She does have  hyperextension of her bilateral knees the left the same as right.  SKIN:  No cellulitis.  Dorsalis pedis pulse positive.  NEUROLOGIC:  Intact distal sensibilities.   Labs, EKG, chest x-ray all pending presurgical clearance.   IMPRESSION:  Left knee medial osteoarthritis.   PLAN OF ACTION:  Left unicondylar knee replacement by surgeon Dr.  Durene Romans on Oct 02, 2006 at [facility].  Questions were encouraged,  answered and reviewed.  Risks and complications were discussed.   We also spoke of post-op medications and use of Celebrex  perioperatively.     ______________________________  Yetta Glassman [address], [place]      Madlyn Frankel. Charlann Boxer, M.D.  Electronically Signed    BLM/MEDQ  D:  09/26/2006  T:  09/26/2006  Job:  [id number]

## 2010-09-23 NOTE — Op Note (Signed)
NAME:  Beth Frey, Beth Frey                           ACCOUNT NO.:  [id number]   MEDICAL RECORD NO.:  [id number]                   PATIENT TYPE:  AMB   LOCATION:  SDC                                  FACILITY:  [facility]   PHYSICIAN:  Randye Lobo, M.D.                DATE OF BIRTH:  1955-09-10   DATE OF PROCEDURE:  02/21/2002  DATE OF DISCHARGE:                                 OPERATIVE REPORT   PREOPERATIVE DIAGNOSES:  1. Menorrhagia.  2. Dysmenorrhea.   POSTOPERATIVE DIAGNOSES:  1. Menorrhagia.  2. Dysmenorrhea.   PROCEDURE:  1. Dilatation and curettage.  2. Cryo ablation of the endometrium.   SURGEON:  Randye Lobo, M.D.   ASSISTANT:  Miguel Aschoff, M.D.   ANESTHESIA:  LMA, paracervical block with 10 cc of 1% lidocaine.   IV FLUIDS:  1100 cc Ringer's lactate.   ESTIMATED BLOOD LOSS:  Minimal.   URINE OUTPUT:  25 cc prior to procedure.   COMPLICATIONS:  None.   INDICATIONS FOR PROCEDURE:  The patient was a 55 year old gravida 0  Caucasian female status post bilateral tubal ligation who presented to the  office on January 29, 2002 reporting heavy menses and painful periods of  two years' duration.  The patient was status post diagnostic laparoscopy  with D&C on December 26, 2000 at which time an endometrial polyp was  diagnosed.  The patient continued to have heavy bleeding and clotting since  the procedure and wished for more definitive therapy.  The patient had  normal thyroid function studies and had a hemoglobin of 13.6 on the day of  her office visit.  On examination the patient was noted to have a small,  anteverted uterus and no adnexal masses were appreciated.  The patient was  given a diagnosis of menorrhagia and dysfunctional uterine bleeding and a  decision was made to proceed with a D&C with cryo endometrial ablation after  the risks, benefits, and alternatives were discussed with her.   FINDINGS:  Examination under anesthesia revealed a small, anteverted,  anteflexed uterus.  No adnexal masses were appreciated.   Moderate amount of endometrial curettings were obtained and sent to  pathology.   PROCEDURE:  After the patient was __________  in the preoperative hold area,  she was taken down to the operating room.  The patient did receive Ancef 1 g  intravenously for antibiotic prophylaxis.  The patient was placed in the  supine position on the operating room table and initially an attempt to  perform MAC anesthesia was made.  The patient was not having adequate  relaxation at this time and a decision was made to convert to an LMA by the  anesthesia team.  The patient was then placed in the dorsal lithotomy  position where the vagina and perineum were sterilely prepped and draped.   A speculum was placed inside the vagina and a single  tooth tenaculum was  placed on the anterior cervical lip.  A paracervical block was performed in  a standard fashion with 10 cc 1% lidocaine.  The uterus was then sounded.  The cervix was then serially dilated to a number 21 Pratt dilator.  A  serrated curette was then inserted through to the cervical os to the level  of the uterine fundus and all four quadrants were curetted such that the  endometrium had a gritty texture to it.  The specimen was sent to pathology.   The cryo endometrial ablation was performed next.  A cryo probe was placed  through the cervical os to the level of the uterine fundus and was then  placed in the right cornual region while securing the uterus with the single  tooth tenaculum on the anterior cervical lip.  The cryo ablation was  performed for a total of six minutes in the right cornual region.  The probe  was then adequately cooled so that the probe tip could be removed from the  uterine cavity.  Additional warming of the uterus occurred spontaneously  such that the cryo probe could adequately be introduced back into the  uterine cavity to the level of the uterine fundus and then  placed in the  patient's left cornual region.  Again, the cryo cycle was performed for six  minutes in this region.  The tissue was then spontaneously rewarmed and the  probe was removed.  All of the instruments were removed from the vagina and  hemostasis was noted to be excellent.   The patient was then awakened and escorted to the recovery room in stable  condition.  There were no complications to the procedure.  All needle,  instrument, and sponge counts were correct.                                               Randye Lobo, M.D.    BES/MEDQ  D:  02/21/2002  T:  02/21/2002  Job:  [id number]

## 2010-11-26 ENCOUNTER — Emergency Department (HOSPITAL_BASED_OUTPATIENT_CLINIC_OR_DEPARTMENT_OTHER)
Admission: EM | Admit: 2010-11-26 | Discharge: 2010-11-26 | Disposition: A | Discharge status: Home / Self Care | Payer: BC Managed Care – PPO | Attending: Emergency Medicine | Admitting: Emergency Medicine

## 2010-11-26 ENCOUNTER — Encounter: Payer: Self-pay | Admitting: *Deleted

## 2010-11-26 DIAGNOSIS — M549 Dorsalgia, unspecified: Secondary | ICD-10-CM | POA: Insufficient documentation

## 2010-11-26 HISTORY — DX: Other chronic pain: G89.29

## 2010-11-26 HISTORY — DX: Dorsalgia, unspecified: M54.9

## 2010-11-26 LAB — URINALYSIS, ROUTINE W REFLEX MICROSCOPIC
Bilirubin Urine: NEGATIVE
Leukocytes, UA: NEGATIVE
Nitrite: NEGATIVE
Specific Gravity, Urine: 1.016 (ref 1.005–1.030)
Urobilinogen, UA: 0.2 mg/dL (ref 0.0–1.0)

## 2010-11-26 MED ORDER — DEXAMETHASONE SODIUM PHOSPHATE 10 MG/ML IJ SOLN
10.0000 mg | Freq: Once | INTRAMUSCULAR | Status: AC
  Administered 2010-11-26: 10 mg via INTRAVENOUS
  Filled 2010-11-26: qty 1

## 2010-11-26 MED ORDER — KETOROLAC TROMETHAMINE 30 MG/ML IJ SOLN
30.0000 mg | Freq: Once | INTRAMUSCULAR | Status: AC
  Administered 2010-11-26: 30 mg via INTRAVENOUS
  Filled 2010-11-26: qty 1

## 2010-11-26 MED ORDER — CYCLOBENZAPRINE HCL 10 MG PO TABS: 10.0000 mg | ORAL_TABLET | Freq: Two times a day (BID) | ORAL | Status: AC | PRN

## 2010-11-26 MED ORDER — HYDROMORPHONE HCL 2 MG PO TABS: 4.0000 mg | ORAL_TABLET | ORAL | Status: AC | PRN

## 2010-11-26 MED ORDER — IBUPROFEN 800 MG PO TABS: 800.0000 mg | ORAL_TABLET | Freq: Three times a day (TID) | ORAL | Status: AC

## 2010-11-26 NOTE — ED Provider Notes (Signed)
History     Chief Complaint  Patient presents with  . Back Pain   Patient is a 55 y.o. female presenting with back pain. The history is provided by the patient.  Back Pain  This is a recurrent problem. Episode onset: thursday 2 days ago. The problem occurs constantly. The problem has not changed since onset.Associated with: hx of arthritis of spine, and SI joint arthritis on the L. Pain location: L SI and mid low back. The pain does not radiate. The pain is severe. The symptoms are aggravated by bending and twisting. The pain is worse during the day. Pertinent negatives include no fever, no numbness, no abdominal pain, no bladder incontinence, no dysuria, no leg pain, no paresis, no tingling and no weakness. She has tried NSAIDs (dilaudid) for the symptoms. The treatment provided moderate (improved until ran out of dilaudid) relief.    No past medical history on file.  No past surgical history on file.  No family history on file.  History  Substance Use Topics  . Smoking status: Not on file  . Smokeless tobacco: Not on file  . Alcohol Use: Not on file    OB History    Grav Para Term Preterm Abortions TAB SAB Ect Mult Living                  Review of Systems  Constitutional: Negative for fever and chills.  HENT: Negative for neck pain.   Gastrointestinal: Negative for nausea, vomiting, abdominal pain and diarrhea.  Genitourinary: Negative for bladder incontinence, dysuria and difficulty urinating.  Musculoskeletal: Positive for back pain.  Skin: Negative for rash.  Neurological: Negative for tingling, weakness and numbness.    Physical Exam  BP 121/77  Pulse 82  Temp(Src) 100.7 F (38.2 C) (Oral)  Resp 19  SpO2 99%  Physical Exam  Constitutional: She appears well-developed and well-nourished.       uncomfortable  HENT:  Head: Normocephalic and atraumatic.  Eyes: Conjunctivae are normal. No scleral icterus.  Neck: Normal range of motion. Neck supple. No JVD  present. No thyromegaly present.  Cardiovascular: Normal rate, regular rhythm, normal heart sounds and intact distal pulses.  Exam reveals no gallop and no friction rub.   No murmur heard. Pulmonary/Chest: Effort normal and breath sounds normal. No respiratory distress. She has no wheezes. She has no rales.  Abdominal: Soft. Bowel sounds are normal. She exhibits no distension and no mass. There is no tenderness.  Musculoskeletal: Normal range of motion. She exhibits tenderness. She exhibits no edema.       ttp in the L spine and the L paraspinal and SI joint locations  Lymphadenopathy:    She has no cervical adenopathy.  Neurological: She is alert. Coordination normal.  Skin: Skin is warm and dry. No rash noted. She is not diaphoretic. No erythema.  Psychiatric: She has a normal mood and affect. Her behavior is normal.    ED Course  Procedures  MDM Pt has hx of back pain - though is neuro intact and no red flags detected, has low grade fever, UA requested, pain meds given.   The patient states that symptoms are improved and desires discharge. There are no focal neurologic problems on exam. Temperature is 100.7, patient is aware, urinalysis negative. Will followup with family care doctor. Prescriptions for Tylox and Flexeril given for home.   Vida Roller, MD 11/26/10 [phone]

## 2010-11-26 NOTE — ED Notes (Signed)
PATIENT C/O BACK PAIN SINCE Thursday, PAIN LEVEL INCREASED TO THE POINT TODAY, DIFFICULT TO AMBULATE

## 2011-01-26 LAB — DIFFERENTIAL
Basophils Absolute: 0
Basophils Absolute: 0.1
Eosinophils Absolute: 0.1
Eosinophils Relative: 1
Eosinophils Relative: 2
Lymphocytes Relative: 31
Neutro Abs: 7.2
Neutrophils Relative %: 60

## 2011-01-26 LAB — POCT PREGNANCY, URINE: Preg Test, Ur: NEGATIVE

## 2011-01-26 LAB — CBC
HCT: 39
HCT: 45.2
Hemoglobin: 15.7 — ABNORMAL HIGH
MCHC: 34.7
MCV: 93.4
MCV: 94.5
Platelets: 203
RBC: 4.84
RDW: 12.5
WBC: 11.9 — ABNORMAL HIGH

## 2011-01-26 LAB — COMPREHENSIVE METABOLIC PANEL
AST: 35
BUN: 16
CO2: 22
Calcium: 8.9
Chloride: 103
Creatinine, Ser: 1
GFR calc non Af Amer: 59 — ABNORMAL LOW
Glucose, Bld: 95
Total Bilirubin: 0.9

## 2011-01-26 LAB — TYPE AND SCREEN: ABO/RH(D): A NEG

## 2011-01-26 LAB — URINALYSIS, ROUTINE W REFLEX MICROSCOPIC
Glucose, UA: NEGATIVE
Ketones, ur: NEGATIVE
Protein, ur: NEGATIVE
Urobilinogen, UA: 0.2

## 2011-01-26 LAB — LIPASE, BLOOD: Lipase: 26

## 2011-01-27 LAB — CBC
Hemoglobin: 12.1
MCHC: 34.8
MCHC: 35.5
MCV: 94.6
Platelets: 181
RBC: 3.65 — ABNORMAL LOW
RDW: 12.7

## 2011-01-27 LAB — URINALYSIS, ROUTINE W REFLEX MICROSCOPIC
Hgb urine dipstick: NEGATIVE
Nitrite: NEGATIVE
Specific Gravity, Urine: >1.03 — ABNORMAL HIGH
pH: 5.5

## 2011-01-27 LAB — COMPREHENSIVE METABOLIC PANEL
AST: 28
Albumin: 2.8 — ABNORMAL LOW
Calcium: 8.4
Creatinine, Ser: 0.59
GFR calc Af Amer: >60
GFR calc non Af Amer: >60

## 2011-02-01 LAB — URINALYSIS, ROUTINE W REFLEX MICROSCOPIC
Bilirubin Urine: NEGATIVE
Hgb urine dipstick: NEGATIVE
Ketones, ur: NEGATIVE
Nitrite: NEGATIVE
Protein, ur: NEGATIVE
Specific Gravity, Urine: 1.003 — ABNORMAL LOW
Urobilinogen, UA: 0.2

## 2011-02-01 LAB — COMPREHENSIVE METABOLIC PANEL
ALT: 81 — ABNORMAL HIGH
AST: 43 — ABNORMAL HIGH
Albumin: 3.1 — ABNORMAL LOW
Alkaline Phosphatase: 22 — ABNORMAL LOW
Alkaline Phosphatase: 23 — ABNORMAL LOW
CO2: 27
CO2: 27
Calcium: 10.6 — ABNORMAL HIGH
Calcium: 9.9
Chloride: 107
Chloride: 108
Chloride: 111
Creatinine, Ser: 0.82
Creatinine, Ser: 0.87
GFR calc Af Amer: >60
GFR calc non Af Amer: >60
GFR calc non Af Amer: >60
Glucose, Bld: 90
Glucose, Bld: 99
Potassium: 3.4 — ABNORMAL LOW
Sodium: 141
Total Bilirubin: 0.5
Total Bilirubin: 0.6
Total Bilirubin: 0.7
Total Protein: 5.7 — ABNORMAL LOW

## 2011-02-01 LAB — DIFFERENTIAL
Basophils Absolute: 0
Basophils Relative: 1
Eosinophils Absolute: 0.1
Monocytes Absolute: 0.7
Monocytes Relative: 11
Neutro Abs: 3.3

## 2011-02-01 LAB — METANEPHRINES, URINE, 24 HOUR
Metaneph Total, Ur: 322 mcg/24 h (ref 224–832)
Metanephrines, Ur: 144 mcg/24 h (ref 90–315)

## 2011-02-01 LAB — POCT I-STAT, CHEM 8
BUN: 18
Calcium, Ion: 1.33 — ABNORMAL HIGH
Chloride: 102
Glucose, Bld: 137 — ABNORMAL HIGH
HCT: 42
TCO2: 27

## 2011-02-01 LAB — POCT PREGNANCY, URINE
Operator id: 261601
Preg Test, Ur: NEGATIVE

## 2011-02-01 LAB — TSH: TSH: 1.139

## 2011-02-01 LAB — POCT CARDIAC MARKERS
CKMB, poc: <1 — ABNORMAL LOW
Myoglobin, poc: 40.2
Operator id: 261601
Troponin i, poc: <0.05

## 2011-02-01 LAB — CREATININE, URINE, 24 HOUR
Collection Interval-UCRE24: 24
Creatinine, 24H Ur: 1168
Creatinine, Urine: 58.4

## 2011-02-01 LAB — CBC
Hemoglobin: 14.1
MCHC: 34.5
MCV: 93.6
RBC: 4.37
RDW: 13.4

## 2011-02-02 DIAGNOSIS — Z85828 Personal history of other malignant neoplasm of skin: Secondary | ICD-10-CM | POA: Insufficient documentation

## 2011-02-08 ENCOUNTER — Emergency Department (HOSPITAL_COMMUNITY): Payer: BC Managed Care – PPO

## 2011-02-08 ENCOUNTER — Emergency Department (HOSPITAL_COMMUNITY)
Admission: EM | Admit: 2011-02-08 | Discharge: 2011-02-08 | Disposition: A | Discharge status: Home / Self Care | Payer: BC Managed Care – PPO | Attending: Emergency Medicine | Admitting: Emergency Medicine

## 2011-02-08 DIAGNOSIS — Z79899 Other long term (current) drug therapy: Secondary | ICD-10-CM | POA: Insufficient documentation

## 2011-02-08 DIAGNOSIS — R51 Headache: Secondary | ICD-10-CM | POA: Insufficient documentation

## 2011-02-08 DIAGNOSIS — F341 Dysthymic disorder: Secondary | ICD-10-CM | POA: Insufficient documentation

## 2011-02-08 DIAGNOSIS — H53149 Visual discomfort, unspecified: Secondary | ICD-10-CM | POA: Insufficient documentation

## 2011-02-08 DIAGNOSIS — I10 Essential (primary) hypertension: Secondary | ICD-10-CM | POA: Insufficient documentation

## 2011-02-08 DIAGNOSIS — R112 Nausea with vomiting, unspecified: Secondary | ICD-10-CM | POA: Insufficient documentation

## 2011-02-08 LAB — BASIC METABOLIC PANEL
BUN: 15 mg/dL (ref 6–23)
CO2: 24 mEq/L (ref 19–32)
Chloride: 102 mEq/L (ref 96–112)
Creatinine, Ser: 0.74 mg/dL (ref 0.50–1.10)
Glucose, Bld: 104 mg/dL — ABNORMAL HIGH (ref 70–99)

## 2011-02-08 LAB — CBC
HCT: 43.5 % (ref 36.0–46.0)
MCH: 31.7 pg (ref 26.0–34.0)
MCV: 90.1 fL (ref 78.0–100.0)
RBC: 4.83 MIL/uL (ref 3.87–5.11)
WBC: 9.8 10*3/uL (ref 4.0–10.5)

## 2011-02-08 LAB — CSF CELL COUNT WITH DIFFERENTIAL
RBC Count, CSF: 1 /mm3 — ABNORMAL HIGH
WBC, CSF: 1 /mm3 (ref 0–5)

## 2011-02-08 LAB — DIFFERENTIAL
Lymphocytes Relative: 35 % (ref 12–46)
Lymphs Abs: 3.4 10*3/uL (ref 0.7–4.0)
Monocytes Relative: 9 % (ref 3–12)
Neutrophils Relative %: 55 % (ref 43–77)

## 2011-02-12 LAB — CSF CULTURE W GRAM STAIN

## 2011-05-23 ENCOUNTER — Ambulatory Visit (INDEPENDENT_AMBULATORY_CARE_PROVIDER_SITE_OTHER): Payer: BC Managed Care – PPO | Admitting: Emergency Medicine

## 2011-05-23 DIAGNOSIS — I10 Essential (primary) hypertension: Secondary | ICD-10-CM

## 2011-05-23 DIAGNOSIS — F411 Generalized anxiety disorder: Secondary | ICD-10-CM

## 2011-05-23 DIAGNOSIS — IMO0002 Reserved for concepts with insufficient information to code with codable children: Secondary | ICD-10-CM

## 2011-05-23 DIAGNOSIS — G4700 Insomnia, unspecified: Secondary | ICD-10-CM

## 2011-06-14 ENCOUNTER — Encounter (INDEPENDENT_AMBULATORY_CARE_PROVIDER_SITE_OTHER): Payer: Self-pay | Admitting: General Surgery

## 2011-06-14 ENCOUNTER — Ambulatory Visit (INDEPENDENT_AMBULATORY_CARE_PROVIDER_SITE_OTHER): Payer: BC Managed Care – PPO | Admitting: General Surgery

## 2011-06-14 VITALS — BP 118/78 | HR 72 | Temp 97.2°F | Resp 14 | Ht 68.0 in | Wt 146.0 lb

## 2011-06-14 DIAGNOSIS — K432 Incisional hernia without obstruction or gangrene: Secondary | ICD-10-CM | POA: Insufficient documentation

## 2011-06-14 NOTE — Progress Notes (Signed)
Patient ID: Beth Frey, female   DOB: August 04, 1955, 56 y.o.   MRN: [id number]  Chief Complaint  Patient presents with  . Pain    lap colectomy 2011    HPI Beth Frey is a 56 y.o. female.   HPI She is self referred him here with her husband for evaluation of a incisional hernia. On February 20, 2010 she had an emergency right colectomy for ischemic right colon secondary to a cecal bascule. Recently, she's been noticing an enlarging area of swelling to the right of the incision. It does not cause her any pain. No obstructive symptoms.  She is here to have that evaluated. She is leaving the country to go on vacation next week.  Past Medical History  Diagnosis Date  . Back pain, chronic   . Anxiety   . Depression   . Cecal bascule   . Osteoarthritis   . Cancer     skin    Past Surgical History  Procedure Date  . Knee surgery   . Wrist surgery 2009    right wrist  . Elbow surgery     left elbow  . Colon surgery     right colectomy  . Ovarian cyst removal     right  . Skin cancer excision     multiple-SCC and BCC    History reviewed. No pertinent family history.  Social History History  Substance Use Topics  . Smoking status: Never Smoker   . Smokeless tobacco: Not on file  . Alcohol Use: No    Allergies  Allergen Reactions  . Sulfa Antibiotics Itching and Swelling    Current Outpatient Prescriptions  Medication Sig Dispense Refill  . cloNIDine (CATAPRES) 0.3 MG tablet Take 0.3 mg by mouth 3 (three) times daily.        Marland Kitchen LORazepam (ATIVAN) 1 MG tablet Take 1 mg by mouth as needed. Hormone symptoms       . mometasone (NASONEX) 50 MCG/ACT nasal spray Place 1 spray into the nose daily as needed. Seasonal allergies        . Omega-3 Fatty Acids (FISH OIL) 1200 MG CAPS Take 2 capsules by mouth daily.        . sertraline (ZOLOFT) 100 MG tablet Take 150 mg by mouth daily.        Marland Kitchen tetrahydrozoline 0.05 % ophthalmic solution 1 drop as needed.      . hydroxypropyl  methylcellulose (ISOPTO TEARS) 2.5 % ophthalmic solution Place 1 drop into both eyes 2 (two) times daily.          Review of Systems Review of Systems  Constitutional: Negative for fever and chills.  Cardiovascular: Negative.   Gastrointestinal: Positive for abdominal distention.    Blood pressure 118/78, pulse 72, temperature 97.2 F (36.2 C), temperature source Temporal, resp. rate 14, height 5\' 8"  (1.727 m), weight 146 lb (66.225 kg).  Physical Exam Physical Exam  Constitutional: She appears well-developed and well-nourished. No distress.  Cardiovascular: Normal rate and regular rhythm.   Abdominal: Soft. There is no tenderness. There is no guarding.       Midline incision with a bulge to the right that is not completely reducible. No tenderness.    Data Reviewed  Old notes  Assessment    Findings suspicious for an incisional hernia and we discussed this.  It is asx.    Plan    CT scan of the abdomen and pelvis to further define this process.  If it is an incisional  hernia, we discussed laparoscopic possible open repair.  I have discussed the procedure, risks, and aftercare. Risks include but are not limited to bleeding, infection, wound healing problems, anesthesia, recurrence, accidental injury to intra-abdominal organs-such as intestine, liver, spleen, bladder, etc. We also discussed the rare complication of mesh rejection. All questions were answered.  I will call her with the results of the CT scan and discuss treatment plans at that time.       Kennita Pavlovich J 06/14/2011, 12:25 PM

## 2011-06-14 NOTE — Patient Instructions (Signed)
Avoid heavy lifting as much as you can. We will call you with your CT scan results.

## 2011-06-16 ENCOUNTER — Other Ambulatory Visit: Payer: Self-pay | Admitting: Physician Assistant

## 2011-06-16 ENCOUNTER — Ambulatory Visit
Admission: RE | Admit: 2011-06-16 | Discharge: 2011-06-16 | Disposition: A | Discharge status: Home / Self Care | Payer: BC Managed Care – PPO | Source: Ambulatory Visit | Attending: General Surgery | Admitting: General Surgery

## 2011-06-16 DIAGNOSIS — K432 Incisional hernia without obstruction or gangrene: Secondary | ICD-10-CM

## 2011-06-16 MED ORDER — SERTRALINE HCL 100 MG PO TABS: 100.0000 mg | ORAL_TABLET | Freq: Every day | ORAL | Status: DC

## 2011-06-16 MED ORDER — CLONIDINE HCL 0.3 MG PO TABS: 0.3000 mg | ORAL_TABLET | Freq: Three times a day (TID) | ORAL | Status: DC

## 2011-06-16 MED ORDER — IOHEXOL 300 MG/ML  SOLN
100.0000 mL | Freq: Once | INTRAMUSCULAR | Status: AC | PRN
  Administered 2011-06-16: 100 mL via INTRAVENOUS

## 2011-06-19 ENCOUNTER — Telehealth (INDEPENDENT_AMBULATORY_CARE_PROVIDER_SITE_OTHER): Payer: Self-pay | Admitting: General Surgery

## 2011-06-19 NOTE — Telephone Encounter (Signed)
I reviewed her CT scan results and spoke with her about it. This does not demonstrate an incisional hernia. He demonstrates asymmetric deposition of subcutaneous tissue on the right side which relates with her clinical exam. She understands what this means.

## 2011-08-01 ENCOUNTER — Other Ambulatory Visit: Payer: Self-pay | Admitting: Orthopedic Surgery

## 2011-08-01 DIAGNOSIS — S63509A Unspecified sprain of unspecified wrist, initial encounter: Secondary | ICD-10-CM

## 2011-08-02 ENCOUNTER — Ambulatory Visit
Admission: RE | Admit: 2011-08-02 | Discharge: 2011-08-02 | Disposition: A | Discharge status: Home / Self Care | Payer: BC Managed Care – PPO | Source: Ambulatory Visit | Attending: Orthopedic Surgery | Admitting: Orthopedic Surgery

## 2011-08-02 DIAGNOSIS — S63509A Unspecified sprain of unspecified wrist, initial encounter: Secondary | ICD-10-CM

## 2011-08-22 ENCOUNTER — Ambulatory Visit (INDEPENDENT_AMBULATORY_CARE_PROVIDER_SITE_OTHER): Payer: BC Managed Care – PPO | Admitting: Emergency Medicine

## 2011-08-22 ENCOUNTER — Encounter: Payer: Self-pay | Admitting: Emergency Medicine

## 2011-08-22 VITALS — BP 104/68 | HR 65 | Temp 97.7°F | Resp 20 | Ht 67.5 in | Wt 142.2 lb

## 2011-08-22 DIAGNOSIS — I10 Essential (primary) hypertension: Secondary | ICD-10-CM

## 2011-08-22 DIAGNOSIS — E876 Hypokalemia: Secondary | ICD-10-CM

## 2011-08-22 DIAGNOSIS — E785 Hyperlipidemia, unspecified: Secondary | ICD-10-CM

## 2011-08-22 LAB — POCT URINALYSIS DIPSTICK
Ketones, UA: NEGATIVE
Leukocytes, UA: NEGATIVE
Spec Grav, UA: 1.02
pH, UA: 6

## 2011-08-22 NOTE — Progress Notes (Signed)
  Subjective:    Patient ID: Beth Frey, female    DOB: [birth year], 56 y.o.   MRN: [id number]  HPI patient states she is doing well. She recently returned from a trip to [place]. She recently had surgery on her right wrist by Dr. Nevada Crane. She states that her potassium was in the twos when she had anesthesia that day. Her potassium was low on her last checkup here and she was advised to increase her potassium to twice a day however she did not do this and did not come in for recheck.    Review of Systems she denies chest pain shortness of breath or bowel problems.     Objective:   Physical Exam physical exam reveals an alert cooperative female who is somewhat flushed. Her neck is supple with a right thyroid nodule. Chest is clear to auscultation and percussion heart regular rate no murmurs abdomen soft without tenderness.        Assessment & Plan:  Medical problems are stable at present. Her blood pressure is under excellent control. She is a history of low potassium we'll check a potassium today and if still low will increase her potassium and get referral to nephrologist for their evaluation. There were no make changes in her medication today I did go ahead and check a vitamin D level as she has been low in the past the

## 2011-08-23 ENCOUNTER — Other Ambulatory Visit: Payer: Self-pay | Admitting: Emergency Medicine

## 2011-08-23 DIAGNOSIS — E876 Hypokalemia: Secondary | ICD-10-CM

## 2011-08-23 LAB — BASIC METABOLIC PANEL
CO2: 26 mEq/L (ref 19–32)
Calcium: 9.5 mg/dL (ref 8.4–10.5)
Chloride: 97 mEq/L (ref 96–112)
Potassium: 2.9 mEq/L — ABNORMAL LOW (ref 3.5–5.3)
Sodium: 138 mEq/L (ref 135–145)

## 2011-08-23 LAB — LIPID PANEL
HDL: 52 mg/dL (ref >39)
Total CHOL/HDL Ratio: 4.5 Ratio
VLDL: 34 mg/dL (ref 0–40)

## 2011-08-30 ENCOUNTER — Telehealth: Payer: Self-pay

## 2011-08-30 NOTE — Telephone Encounter (Signed)
Arline Asp from [facility] would like to let Dr Cleta Alberts know that pt canceled her appt for Friday 09-01-11

## 2011-09-08 ENCOUNTER — Other Ambulatory Visit: Payer: Self-pay | Admitting: Family Medicine

## 2011-09-08 MED ORDER — LORAZEPAM 1 MG PO TABS: ORAL_TABLET | ORAL | Status: DC

## 2011-09-11 ENCOUNTER — Other Ambulatory Visit: Payer: Self-pay | Admitting: Physician Assistant

## 2011-09-14 ENCOUNTER — Other Ambulatory Visit: Payer: Self-pay | Admitting: Family Medicine

## 2011-09-14 MED ORDER — SERTRALINE HCL 100 MG PO TABS: 100.0000 mg | ORAL_TABLET | Freq: Every day | ORAL | Status: DC

## 2011-10-30 ENCOUNTER — Other Ambulatory Visit: Payer: Self-pay | Admitting: Family Medicine

## 2011-10-30 MED ORDER — VITAMIN D (ERGOCALCIFEROL) 1.25 MG (50000 UNIT) PO CAPS: 50000.0000 [IU] | ORAL_CAPSULE | ORAL | Status: DC

## 2011-11-03 ENCOUNTER — Other Ambulatory Visit: Payer: Self-pay

## 2011-11-03 MED ORDER — LORAZEPAM 1 MG PO TABS: ORAL_TABLET | ORAL | Status: DC

## 2011-11-16 ENCOUNTER — Other Ambulatory Visit: Payer: Self-pay | Admitting: Physician Assistant

## 2011-11-21 ENCOUNTER — Encounter: Payer: Self-pay | Admitting: Emergency Medicine

## 2011-11-21 ENCOUNTER — Ambulatory Visit (INDEPENDENT_AMBULATORY_CARE_PROVIDER_SITE_OTHER): Payer: BC Managed Care – PPO | Admitting: Emergency Medicine

## 2011-11-21 VITALS — BP 121/84 | HR 75 | Temp 98.1°F | Resp 18 | Ht 67.0 in | Wt 145.6 lb

## 2011-11-21 DIAGNOSIS — I10 Essential (primary) hypertension: Secondary | ICD-10-CM

## 2011-11-21 DIAGNOSIS — F32A Depression, unspecified: Secondary | ICD-10-CM

## 2011-11-21 DIAGNOSIS — F341 Dysthymic disorder: Secondary | ICD-10-CM

## 2011-11-21 DIAGNOSIS — F329 Major depressive disorder, single episode, unspecified: Secondary | ICD-10-CM

## 2011-11-21 DIAGNOSIS — E559 Vitamin D deficiency, unspecified: Secondary | ICD-10-CM

## 2011-11-21 DIAGNOSIS — E876 Hypokalemia: Secondary | ICD-10-CM

## 2011-11-21 DIAGNOSIS — G4700 Insomnia, unspecified: Secondary | ICD-10-CM

## 2011-11-21 LAB — CBC WITH DIFFERENTIAL/PLATELET
Basophils Absolute: 0 10*3/uL (ref 0.0–0.1)
Eosinophils Relative: 4 % (ref 0–5)
HCT: 41.6 % (ref 36.0–46.0)
Lymphocytes Relative: 35 % (ref 12–46)
Lymphs Abs: 2.8 10*3/uL (ref 0.7–4.0)
MCV: 90 fL (ref 78.0–100.0)
Monocytes Absolute: 0.8 10*3/uL (ref 0.1–1.0)
Neutro Abs: 4 10*3/uL (ref 1.7–7.7)
Platelets: 229 10*3/uL (ref 150–400)
RBC: 4.62 MIL/uL (ref 3.87–5.11)
WBC: 7.9 10*3/uL (ref 4.0–10.5)

## 2011-11-21 LAB — COMPREHENSIVE METABOLIC PANEL
ALT: 22 U/L (ref 0–35)
AST: 28 U/L (ref 0–37)
Albumin: 4.4 g/dL (ref 3.5–5.2)
CO2: 24 mEq/L (ref 19–32)
Calcium: 10 mg/dL (ref 8.4–10.5)
Chloride: 101 mEq/L (ref 96–112)
Creat: 0.64 mg/dL (ref 0.50–1.10)
Potassium: 3.5 mEq/L (ref 3.5–5.3)
Total Protein: 7.2 g/dL (ref 6.0–8.3)

## 2011-11-21 MED ORDER — CLONIDINE HCL 0.3 MG PO TABS: 0.3000 mg | ORAL_TABLET | Freq: Three times a day (TID) | ORAL | Status: DC

## 2011-11-21 MED ORDER — LORAZEPAM 1 MG PO TABS: ORAL_TABLET | ORAL | Status: DC

## 2011-11-21 MED ORDER — SERTRALINE HCL 100 MG PO TABS: 100.0000 mg | ORAL_TABLET | Freq: Every day | ORAL | Status: DC

## 2011-11-21 MED ORDER — POTASSIUM CHLORIDE CRYS ER 10 MEQ PO TBCR: 10.0000 meq | EXTENDED_RELEASE_TABLET | Freq: Two times a day (BID) | ORAL | Status: DC

## 2011-11-21 NOTE — Progress Notes (Signed)
  Subjective:    Patient ID: Beth Frey, female    DOB: 06/23/1955, 56 y.o.   MRN: [id number]  HPI patient enters for recheck. The only major complaint has been a difficulty with sleep she currently takes Ativan one twice a day and 2 at bedtime. She went to see Dr. Vickey Huger who recommended a small dose of Seroquel which she did not tolerate well. She recently has had a skin cancer removed from her anterior chest. She has a trip planned to go fishing with her husband in [place].    Review of Systems     Objective:   Physical Exam  Constitutional: She appears well-developed and well-nourished.  HENT:  Right Ear: External ear normal.  Left Ear: External ear normal.  Eyes: Pupils are equal, round, and reactive to light.  Neck: No tracheal deviation present. No thyromegaly present.  Cardiovascular: Normal rate and regular rhythm.   Pulmonary/Chest: Effort normal and breath sounds normal. No respiratory distress. She has no wheezes. She has no rales. She exhibits no tenderness.  Abdominal: She exhibits no distension and no mass. There is no tenderness. There is no rebound and no guarding.          Assessment & Plan:  Patient looks great today. All her medical problems are stable except for her continued difficulty with insomnia. She will continue Ativan one twice a day and then take one at bedtime if she awakens in the early morning she can take a second

## 2011-11-21 NOTE — Progress Notes (Signed)
56 year old White Female is here today for three month checkup. Pt is not fasting - ate two crackers. Pt states she will need medication refill on Zoloft. Pt states she is having trouble sleeping. Pt states she has no other concerns.

## 2011-11-22 ENCOUNTER — Encounter: Payer: Self-pay | Admitting: Family Medicine

## 2011-11-22 ENCOUNTER — Ambulatory Visit (INDEPENDENT_AMBULATORY_CARE_PROVIDER_SITE_OTHER): Payer: BC Managed Care – PPO | Admitting: Family Medicine

## 2011-11-22 VITALS — BP 129/84 | HR 81 | Temp 98.2°F | Resp 16 | Ht 67.0 in | Wt 148.2 lb

## 2011-11-22 DIAGNOSIS — C4490 Unspecified malignant neoplasm of skin, unspecified: Secondary | ICD-10-CM

## 2011-11-22 DIAGNOSIS — Z8742 Personal history of other diseases of the female genital tract: Secondary | ICD-10-CM

## 2011-11-22 DIAGNOSIS — E041 Nontoxic single thyroid nodule: Secondary | ICD-10-CM

## 2011-11-22 DIAGNOSIS — F419 Anxiety disorder, unspecified: Secondary | ICD-10-CM

## 2011-11-22 DIAGNOSIS — N952 Postmenopausal atrophic vaginitis: Secondary | ICD-10-CM

## 2011-11-22 DIAGNOSIS — E785 Hyperlipidemia, unspecified: Secondary | ICD-10-CM

## 2011-11-22 DIAGNOSIS — E876 Hypokalemia: Secondary | ICD-10-CM

## 2011-11-22 DIAGNOSIS — IMO0002 Reserved for concepts with insufficient information to code with codable children: Secondary | ICD-10-CM

## 2011-11-22 DIAGNOSIS — E559 Vitamin D deficiency, unspecified: Secondary | ICD-10-CM

## 2011-11-22 DIAGNOSIS — N941 Unspecified dyspareunia: Secondary | ICD-10-CM

## 2011-11-22 DIAGNOSIS — Z124 Encounter for screening for malignant neoplasm of cervix: Secondary | ICD-10-CM

## 2011-11-22 LAB — VITAMIN D 25 HYDROXY (VIT D DEFICIENCY, FRACTURES): Vit D, 25-Hydroxy: 36 ng/mL (ref 30–89)

## 2011-11-23 ENCOUNTER — Encounter: Payer: Self-pay | Admitting: Radiology

## 2011-11-23 ENCOUNTER — Encounter: Payer: Self-pay | Admitting: Family Medicine

## 2011-11-23 DIAGNOSIS — Z8742 Personal history of other diseases of the female genital tract: Secondary | ICD-10-CM | POA: Insufficient documentation

## 2011-11-23 DIAGNOSIS — E041 Nontoxic single thyroid nodule: Secondary | ICD-10-CM | POA: Insufficient documentation

## 2011-11-23 DIAGNOSIS — F419 Anxiety disorder, unspecified: Secondary | ICD-10-CM | POA: Insufficient documentation

## 2011-11-23 DIAGNOSIS — C4490 Unspecified malignant neoplasm of skin, unspecified: Secondary | ICD-10-CM | POA: Insufficient documentation

## 2011-11-23 DIAGNOSIS — E785 Hyperlipidemia, unspecified: Secondary | ICD-10-CM | POA: Insufficient documentation

## 2011-11-23 DIAGNOSIS — E876 Hypokalemia: Secondary | ICD-10-CM | POA: Insufficient documentation

## 2011-11-23 DIAGNOSIS — E559 Vitamin D deficiency, unspecified: Secondary | ICD-10-CM | POA: Insufficient documentation

## 2011-11-23 NOTE — Progress Notes (Signed)
Subjective:    Patient ID: Beth Frey, female    DOB: Oct 01, 1955, 56 y.o.   MRN: [id number]  HPI  This  56 y.o. Cauc female was seen on 55/16/13 by Dr. Cleta Alberts for issues pertaining to chronic depression  and insomnia. She was scheduled with me today to address GYN issues and to perform PAP. Pt has chronic  problem with vaginal and perineal irritation with burning and mild dysuria. She reports having vulvar lesions biopsied   in past to rule out Bowen's disease (biopsies negative). In the past, vulvar irritation was thought to be due to  Baby wipes sensitivity She has dyspareunia and vaginal dryness but can not tolerate topical intravaginal estrogen  Reports significant problems in past with near-syncopal episodes and menopausal symptoms and "crashing" when  HRT was abruptly discontinued (she states Dr. Cleta Alberts is very familiar with her history; I reviewed her chart to get  some insight to some of history and work-up done. She has had w/u for Carcinoid).    Review of Systems  Constitutional: Negative for fever, appetite change and unexpected weight change.  Gastrointestinal: Negative.   Genitourinary: Positive for dysuria, vaginal pain and dyspareunia. Negative for urgency, frequency, hematuria, vaginal bleeding, vaginal discharge, difficulty urinating, genital sores and pelvic pain.  Skin: Positive for rash.       Redness and irritation of perineum  Neurological: Negative.   Psychiatric/Behavioral: Positive for dysphoric mood. The patient is nervous/anxious.        Objective:   Physical Exam  Nursing note and vitals reviewed. Constitutional: She is oriented to person, place, and time. She appears well-developed and well-nourished. No distress.  HENT:  Head: Normocephalic and atraumatic.  Eyes: Conjunctivae and EOM are normal. No scleral icterus.  Cardiovascular: Normal rate and regular rhythm.   Pulmonary/Chest: Effort normal. No respiratory distress.  Abdominal: Soft. She exhibits no  distension and no mass. There is no tenderness.  Genitourinary: Rectum normal and uterus normal. Rectal exam shows no external hemorrhoid and no fissure. There is lesion on the right labia. There is no rash, tenderness or injury on the right labia. There is lesion on the left labia. There is no rash, tenderness or injury on the left labia. Uterus is not enlarged, not fixed and not tender. Cervix exhibits no motion tenderness, no discharge and no friability. Right adnexum displays no mass, no tenderness and no fullness. Left adnexum displays no mass, no tenderness and no fullness. There is tenderness around the vagina. No erythema or bleeding around the vagina. No foreign body around the vagina. No vaginal discharge found.       Vulvar lesions: flat erythematous areas c/w post-biopsy sites; slightly tender to touch. No active ulcerative lesions or cystic areas. Vaginal atrophy evident.  Musculoskeletal: She exhibits no edema and no tenderness.  Neurological: She is alert and oriented to person, place, and time. No cranial nerve deficit. Coordination normal.  Skin: Skin is warm and dry.       Upper ext: scarring on forearms s/p excision of skin cancers  Psychiatric: She has a normal mood and affect. Her behavior is normal. Thought content normal.       Affect a little flat and pt is slightly anxious          Assessment & Plan:   1. Pap smear for cervical cancer screening  Pap IG w/ reflex to HPV when ASC-U  2. Postmenopausal atrophic vaginitis - Suspect needs expert advise about treatment given intolerance of HRT in past Ambulatory  referral to Gynecology Advised referral for expert evaluation given that pt has had perineum biopsied in past and symptoms may be related to scarring and tissue atrophy  3. Dyspareunia, female  Ambulatory referral to Gynecology

## 2011-11-24 LAB — PAP IG W/ RFLX HPV ASCU

## 2011-11-24 NOTE — Progress Notes (Signed)
Quick Note:  Notify pt of Normal results. ______ 

## 2011-11-25 ENCOUNTER — Encounter: Payer: Self-pay | Admitting: Family Medicine

## 2011-11-28 ENCOUNTER — Other Ambulatory Visit: Payer: Self-pay | Admitting: Physician Assistant

## 2011-12-01 ENCOUNTER — Other Ambulatory Visit: Payer: Self-pay | Admitting: Physician Assistant

## 2012-03-26 ENCOUNTER — Encounter: Payer: Self-pay | Admitting: Emergency Medicine

## 2012-03-26 ENCOUNTER — Ambulatory Visit (INDEPENDENT_AMBULATORY_CARE_PROVIDER_SITE_OTHER): Payer: BC Managed Care – PPO | Admitting: Emergency Medicine

## 2012-03-26 VITALS — BP 106/72 | HR 57 | Temp 97.8°F | Resp 16 | Ht 68.5 in | Wt 146.0 lb

## 2012-03-26 DIAGNOSIS — E559 Vitamin D deficiency, unspecified: Secondary | ICD-10-CM

## 2012-03-26 DIAGNOSIS — I10 Essential (primary) hypertension: Secondary | ICD-10-CM

## 2012-03-26 DIAGNOSIS — E782 Mixed hyperlipidemia: Secondary | ICD-10-CM

## 2012-03-26 LAB — CBC
MCH: 31.5 pg (ref 26.0–34.0)
MCHC: 34.5 g/dL (ref 30.0–36.0)
MCV: 91.3 fL (ref 78.0–100.0)
Platelets: 248 10*3/uL (ref 150–400)
RDW: 12.8 % (ref 11.5–15.5)
WBC: 6.9 10*3/uL (ref 4.0–10.5)

## 2012-03-26 LAB — COMPREHENSIVE METABOLIC PANEL
ALT: 18 U/L (ref 0–35)
AST: 20 U/L (ref 0–37)
Alkaline Phosphatase: 43 U/L (ref 39–117)
Chloride: 103 mEq/L (ref 96–112)
Creat: 0.74 mg/dL (ref 0.50–1.10)
Total Bilirubin: 0.6 mg/dL (ref 0.3–1.2)

## 2012-03-26 LAB — LIPID PANEL
HDL: 61 mg/dL (ref >39)
LDL Cholesterol: 136 mg/dL — ABNORMAL HIGH (ref 0–99)
Total CHOL/HDL Ratio: 3.7 Ratio
VLDL: 26 mg/dL (ref 0–40)

## 2012-03-26 NOTE — Progress Notes (Signed)
  Subjective:    Patient ID: Beth Frey, female    DOB: August 10, 1955, 56 y.o.   MRN: [id number]  HPI patient is doing well at present. All her medical issues seem to be under good control. She's been traveling a lot and feeling well. She continues on all the same medications. She has a history of elevated blood pressure and a history of flushing  And hypokalemia    Review of Systems     Objective:   Physical Exam time looks good today. She's not in any distress. There is a thyroid nodule palpable on the right which has been previously evaluated. Her chest is clear. Heart regular rate no murmurs. The abdomen is soft and nontender        Assessment & Plan:  Patient stable on all current medications. We'll make no changes at the present time recheck levels of her blood work.

## 2012-04-16 ENCOUNTER — Other Ambulatory Visit: Payer: Self-pay | Admitting: Physician Assistant

## 2012-06-07 ENCOUNTER — Other Ambulatory Visit: Payer: Self-pay | Admitting: Emergency Medicine

## 2012-06-12 ENCOUNTER — Telehealth: Payer: Self-pay | Admitting: *Deleted

## 2012-06-12 MED ORDER — ONDANSETRON 8 MG PO TBDP: 8.0000 mg | ORAL_TABLET | Freq: Three times a day (TID) | ORAL | Status: DC | PRN

## 2012-06-12 NOTE — Telephone Encounter (Signed)
[facility] requesting refill on zofran odt.  Last fill on 05/24/11.  Chart is at nurses station for review MR [id number]

## 2012-06-12 NOTE — Telephone Encounter (Signed)
Rx done and sent to pharmacy 

## 2012-06-26 DIAGNOSIS — D0410 Carcinoma in situ of skin of unspecified eyelid, including canthus: Secondary | ICD-10-CM | POA: Insufficient documentation

## 2012-07-15 ENCOUNTER — Emergency Department (HOSPITAL_BASED_OUTPATIENT_CLINIC_OR_DEPARTMENT_OTHER)
Admission: EM | Admit: 2012-07-15 | Discharge: 2012-07-15 | Disposition: A | Discharge status: Home / Self Care | Payer: BC Managed Care – PPO | Attending: Emergency Medicine | Admitting: Emergency Medicine

## 2012-07-15 ENCOUNTER — Encounter (HOSPITAL_BASED_OUTPATIENT_CLINIC_OR_DEPARTMENT_OTHER): Payer: Self-pay

## 2012-07-15 DIAGNOSIS — Z8739 Personal history of other diseases of the musculoskeletal system and connective tissue: Secondary | ICD-10-CM | POA: Insufficient documentation

## 2012-07-15 DIAGNOSIS — L03119 Cellulitis of unspecified part of limb: Secondary | ICD-10-CM | POA: Insufficient documentation

## 2012-07-15 DIAGNOSIS — Z8719 Personal history of other diseases of the digestive system: Secondary | ICD-10-CM | POA: Insufficient documentation

## 2012-07-15 DIAGNOSIS — G8929 Other chronic pain: Secondary | ICD-10-CM | POA: Insufficient documentation

## 2012-07-15 DIAGNOSIS — Z85828 Personal history of other malignant neoplasm of skin: Secondary | ICD-10-CM | POA: Insufficient documentation

## 2012-07-15 DIAGNOSIS — Z79899 Other long term (current) drug therapy: Secondary | ICD-10-CM | POA: Insufficient documentation

## 2012-07-15 DIAGNOSIS — IMO0002 Reserved for concepts with insufficient information to code with codable children: Secondary | ICD-10-CM | POA: Insufficient documentation

## 2012-07-15 DIAGNOSIS — Y929 Unspecified place or not applicable: Secondary | ICD-10-CM | POA: Insufficient documentation

## 2012-07-15 DIAGNOSIS — W010XXA Fall on same level from slipping, tripping and stumbling without subsequent striking against object, initial encounter: Secondary | ICD-10-CM | POA: Insufficient documentation

## 2012-07-15 DIAGNOSIS — M549 Dorsalgia, unspecified: Secondary | ICD-10-CM | POA: Insufficient documentation

## 2012-07-15 DIAGNOSIS — L03116 Cellulitis of left lower limb: Secondary | ICD-10-CM

## 2012-07-15 DIAGNOSIS — Y939 Activity, unspecified: Secondary | ICD-10-CM | POA: Insufficient documentation

## 2012-07-15 DIAGNOSIS — L02419 Cutaneous abscess of limb, unspecified: Secondary | ICD-10-CM | POA: Insufficient documentation

## 2012-07-15 DIAGNOSIS — Z8614 Personal history of Methicillin resistant Staphylococcus aureus infection: Secondary | ICD-10-CM | POA: Insufficient documentation

## 2012-07-15 DIAGNOSIS — F411 Generalized anxiety disorder: Secondary | ICD-10-CM | POA: Insufficient documentation

## 2012-07-15 MED ORDER — DOXYCYCLINE HYCLATE 100 MG PO CAPS: 100.0000 mg | ORAL_CAPSULE | Freq: Two times a day (BID) | ORAL | Status: DC

## 2012-07-15 MED ORDER — VANCOMYCIN HCL IN DEXTROSE 1-5 GM/200ML-% IV SOLN
1000.0000 mg | Freq: Once | INTRAVENOUS | Status: AC
  Administered 2012-07-15: 1000 mg via INTRAVENOUS
  Filled 2012-07-15: qty 200

## 2012-07-15 MED ORDER — HYDROMORPHONE HCL PF 1 MG/ML IJ SOLN
1.0000 mg | Freq: Once | INTRAMUSCULAR | Status: AC
  Administered 2012-07-15: 1 mg via INTRAVENOUS
  Filled 2012-07-15: qty 1

## 2012-07-15 MED ORDER — SODIUM CHLORIDE 0.9 % IV BOLUS (SEPSIS)
500.0000 mL | Freq: Once | INTRAVENOUS | Status: AC
  Administered 2012-07-15: 500 mL via INTRAVENOUS

## 2012-07-15 MED ORDER — CLINDAMYCIN HCL 300 MG PO CAPS: 300.0000 mg | ORAL_CAPSULE | Freq: Three times a day (TID) | ORAL | Status: DC

## 2012-07-15 MED ORDER — ONDANSETRON HCL 4 MG/2ML IJ SOLN
4.0000 mg | Freq: Once | INTRAMUSCULAR | Status: AC
  Administered 2012-07-15: 4 mg via INTRAVENOUS
  Filled 2012-07-15: qty 2

## 2012-07-15 MED ORDER — HYDROCODONE-ACETAMINOPHEN 5-325 MG PO TABS: 1.0000 | ORAL_TABLET | Freq: Four times a day (QID) | ORAL | Status: DC | PRN

## 2012-07-15 NOTE — ED Notes (Signed)
Pt. reports falling 2 weeks ago onto left leg.  Hx. of MRSA.  Sx. began to worsen in the last 12 hours.  She has taken 6 doses of Doxy without much improvement.  Left lower extremity painful to touch.

## 2012-07-15 NOTE — ED Notes (Signed)
Fell approx 2 weeks ago-left lower leg injury/infection

## 2012-07-15 NOTE — ED Provider Notes (Signed)
History     CSN: [id number]  Arrival date & time 07/15/12  1441   First MD Initiated Contact with Patient 07/15/12 1519      Chief Complaint  Patient presents with  . Leg Injury    (Consider location/radiation/quality/duration/timing/severity/associated sxs/prior treatment) The history is provided by the patient.  pt with hx mrsa cellulitis, c/o spreading redness around abrasion to anterior aspect left lower leg from approximately 1-2 weeks ago.   Abrasion from prior slip/fall. Has been on doxycycline for the past couple days without resolution.  No fever or chills. No nv. States w prior cellulitis has improved relatively quickly w po meds. Tetanus unknown.      Past Medical History  Diagnosis Date  . Back pain, chronic   . Cecal bascule   . Osteoarthritis   . Cancer     skin  . Anxiety     Past Surgical History  Procedure Laterality Date  . Wrist surgery  2009    right wrist  . Elbow surgery      left elbow  . Ovarian cyst removal      right  . Skin cancer excision      multiple-SCC and BCC  . Uterine ablation    . Tubal ligation    . Cholecystectomy    . Colon surgery  02/20/2010    Right hemicolectomy-cecal bascule  . Joint replacement  2006    Left total knee replacement  . Breast surgery  August 2011    Implants removed  . Ablation saphenous vein w/ rfa      Family History  Problem Relation Age of Onset  . Cancer Father     stomach cancer  . Cancer Brother     pancreatic cancer  . Stroke Maternal Grandmother   . Cancer Maternal Grandfather     lung cancer  . Cancer Paternal Grandmother     ovarian  . Heart disease Paternal Grandfather     History  Substance Use Topics  . Smoking status: Never Smoker   . Smokeless tobacco: Not on file  . Alcohol Use: No    OB History   Grav Para Term Preterm Abortions TAB SAB Ect Mult Living                  Review of Systems  Constitutional: Negative for fever and chills.  Gastrointestinal:  Negative for nausea and vomiting.  Skin: Positive for wound.    Allergies  Sulfa antibiotics  Home Medications   Current Outpatient Rx  Name  Route  Sig  Dispense  Refill  . cloNIDine (CATAPRES) 0.3 MG tablet   Oral   Take 1 tablet (0.3 mg total) by mouth 3 (three) times daily. NEEDS OFFICE VISIT/LABS FOR MORE!!!   90 tablet   11   . docusate sodium (COLACE) 250 MG capsule   Oral   Take 250 mg by mouth 2 (two) times daily.         . fluticasone (FLONASE) 50 MCG/ACT nasal spray      instill 1 to 2 sprays into each nostril once daily   16 g   12   . LORazepam (ATIVAN) 1 MG tablet      take 1 tablet by mouth twice a day and 2 tablets at bedtime   60 tablet   1   . mometasone (NASONEX) 50 MCG/ACT nasal spray   Nasal   Place 1 spray into the nose daily as needed. Seasonal allergies           .  Omega-3 Fatty Acids (FISH OIL) 1200 MG CAPS   Oral   Take 2 capsules by mouth daily.           . ondansetron (ZOFRAN-ODT) 8 MG disintegrating tablet   Oral   Take 1 tablet (8 mg total) by mouth every 8 (eight) hours as needed.   30 tablet   0   . polyethylene glycol powder (MIRALAX) powder   Oral   Take 17 g by mouth daily.         . potassium chloride (K-DUR,KLOR-CON) 10 MEQ tablet   Oral   Take 1 tablet (10 mEq total) by mouth 2 (two) times daily.   60 tablet   11   . sertraline (ZOLOFT) 100 MG tablet   Oral   Take 1 tablet (100 mg total) by mouth daily.   30 tablet   11   . Vitamin D, Ergocalciferol, (DRISDOL) 50000 UNITS CAPS   Oral   Take 1 capsule (50,000 Units total) by mouth every 7 (seven) days.   12 capsule   0     BP 128/67  Pulse 73  Temp(Src) 98.6 F (37 C) (Oral)  Resp 18  Ht 5\' 8"  (1.727 m)  Wt 140 lb (63.504 kg)  BMI 21.29 kg/m2  SpO2 100%  Physical Exam  Nursing note and vitals reviewed. Constitutional: She appears well-developed and well-nourished. No distress.  Eyes: Conjunctivae are normal. No scleral icterus.  Neck:  Neck supple. No tracheal deviation present.  Cardiovascular: Normal rate.   Pulmonary/Chest: Effort normal. No respiratory distress.  Abdominal: Normal appearance. She exhibits no distension.  Musculoskeletal:  Small abrasion to anterior aspect left lower leg w scab, approximately 1 cm diameter. No necrotic or devitalized tissue. Area of mild surrounding cellulitis, 3-4 cm around initial wound. No fluctuance/abscess noted.  No erythematous streaking or lymphangitis.   Neurological: She is alert.  Skin: Skin is warm and dry. No rash noted. She is not diaphoretic.  Psychiatric: She has a normal mood and affect.    ED Course  Procedures (including critical care time)     MDM  Iv ns.  Confirmed allergies, only sulfa. vanco iv.  Dilaudid 1 mg iv. zofran iv.   Pt indicates had taken doxy from prior rx, will need new rx.  Given failure of symptoms to improve/resolve w doxy alone, will add clinda.   Plan recheck 24 hrs, or sooner if worse, spreading redness, severe pain, etc.        Suzi Roots, MD 07/15/12 [phone]

## 2012-07-16 ENCOUNTER — Other Ambulatory Visit: Payer: Self-pay | Admitting: Emergency Medicine

## 2012-07-16 ENCOUNTER — Telehealth: Payer: Self-pay | Admitting: Radiology

## 2012-07-16 NOTE — Telephone Encounter (Signed)
Please call patient and check on status and be sure she is doing okay. Be sure she comes in to see me if she is having difficulty with her life situation. Please check and be sure she is not having any suicidal thoughts or ideations. If she is doing well and just needs a refill on her medication she could have a refill until she gets in to see me.

## 2012-07-16 NOTE — Telephone Encounter (Signed)
Please make sure and left a message for her to call us for further followup information .

## 2012-07-16 NOTE — Telephone Encounter (Signed)
Sent meds in to [facility]

## 2012-07-16 NOTE — Telephone Encounter (Signed)
I called to speak to her about renewal of the medications. No answer. Patient is due for follow up with Dr Cleta Alberts.

## 2012-07-16 NOTE — Telephone Encounter (Signed)
Patient feels good at current does of the Lorazepam she states this works well for her and she would like to continue ,when she needs renewal she will return to clinic to see you to discuss.

## 2012-07-29 ENCOUNTER — Encounter: Payer: Self-pay | Admitting: Emergency Medicine

## 2012-08-20 ENCOUNTER — Other Ambulatory Visit: Payer: Self-pay | Admitting: Emergency Medicine

## 2012-08-20 NOTE — Telephone Encounter (Signed)
Called pharmacy to check if pt has p/up both RFs written 07/16/12. Pharmacy reported that pt p/up #60 on 07/16/12 and #60 on 08/04/12. The way sig is written she would need #120/mos if she is taking as sig specifies. Dr Cleta Alberts, did you intend pt to stretch #60 out to last the whole month or for her to be able to take it as written every day? Please advise and correct sig or quantity if needed.

## 2012-08-20 NOTE — Telephone Encounter (Signed)
This came in as a duplicate I do have a prescription for her at [facility] that has been completed and needs to be faxed to the pharmacy in the morning.

## 2012-09-18 ENCOUNTER — Other Ambulatory Visit: Payer: Self-pay | Admitting: Emergency Medicine

## 2012-09-19 ENCOUNTER — Other Ambulatory Visit: Payer: Self-pay | Admitting: Radiology

## 2012-10-17 ENCOUNTER — Telehealth: Payer: Self-pay

## 2012-10-17 NOTE — Telephone Encounter (Signed)
Pharm requests RF of lorazepam 1 mg. Dr Cleta Alberts, if you want pt to have QS for 30 days according to sig she would need #120/mos. She probably already used the RF you gave last month. If this is your intention, please be sure to change the quantity of RF.

## 2012-10-17 NOTE — Telephone Encounter (Signed)
Okay to refill the Ativan. She is to take one tablet twice a day and 2 at bedtime so she does get #120. She can have 3 refills

## 2012-10-18 MED ORDER — LORAZEPAM 1 MG PO TABS: ORAL_TABLET | ORAL | Status: DC

## 2012-10-18 NOTE — Telephone Encounter (Signed)
Called in Rx as written.

## 2012-10-21 ENCOUNTER — Other Ambulatory Visit: Payer: Self-pay | Admitting: Emergency Medicine

## 2012-10-25 ENCOUNTER — Ambulatory Visit (INDEPENDENT_AMBULATORY_CARE_PROVIDER_SITE_OTHER): Payer: BC Managed Care – PPO | Admitting: Internal Medicine

## 2012-10-25 VITALS — BP 122/90 | HR 70 | Temp 98.4°F | Resp 18 | Ht 67.0 in | Wt 143.0 lb

## 2012-10-25 DIAGNOSIS — R5383 Other fatigue: Secondary | ICD-10-CM

## 2012-10-25 DIAGNOSIS — F32A Depression, unspecified: Secondary | ICD-10-CM

## 2012-10-25 DIAGNOSIS — F329 Major depressive disorder, single episode, unspecified: Secondary | ICD-10-CM

## 2012-10-25 DIAGNOSIS — E876 Hypokalemia: Secondary | ICD-10-CM

## 2012-10-25 DIAGNOSIS — E785 Hyperlipidemia, unspecified: Secondary | ICD-10-CM

## 2012-10-25 DIAGNOSIS — R5381 Other malaise: Secondary | ICD-10-CM

## 2012-10-25 LAB — LIPID PANEL
HDL: 76 mg/dL (ref >39)
LDL Cholesterol: 193 mg/dL — ABNORMAL HIGH (ref 0–99)
Total CHOL/HDL Ratio: 3.9 Ratio
Triglycerides: 143 mg/dL (ref <150)
VLDL: 29 mg/dL (ref 0–40)

## 2012-10-25 LAB — COMPREHENSIVE METABOLIC PANEL
AST: 29 U/L (ref 0–37)
Albumin: 4.3 g/dL (ref 3.5–5.2)
Alkaline Phosphatase: 53 U/L (ref 39–117)
BUN: 13 mg/dL (ref 6–23)
Creat: 0.76 mg/dL (ref 0.50–1.10)
Glucose, Bld: 103 mg/dL — ABNORMAL HIGH (ref 70–99)
Total Bilirubin: 0.8 mg/dL (ref 0.3–1.2)

## 2012-10-25 LAB — CBC WITH DIFFERENTIAL/PLATELET
Basophils Absolute: 0 10*3/uL (ref 0.0–0.1)
Basophils Relative: 0 % (ref 0–1)
Eosinophils Absolute: 0.2 10*3/uL (ref 0.0–0.7)
Eosinophils Relative: 2 % (ref 0–5)
HCT: 43.9 % (ref 36.0–46.0)
Hemoglobin: 15.3 g/dL — ABNORMAL HIGH (ref 12.0–15.0)
MCH: 30.8 pg (ref 26.0–34.0)
MCHC: 34.9 g/dL (ref 30.0–36.0)
Monocytes Absolute: 0.9 10*3/uL (ref 0.1–1.0)
Monocytes Relative: 10 % (ref 3–12)
Neutro Abs: 4.1 10*3/uL (ref 1.7–7.7)
RDW: 14.2 % (ref 11.5–15.5)

## 2012-10-25 MED ORDER — ONDANSETRON 8 MG PO TBDP: 8.0000 mg | ORAL_TABLET | Freq: Three times a day (TID) | ORAL | Status: DC | PRN

## 2012-10-25 MED ORDER — MOMETASONE FUROATE 50 MCG/ACT NA SUSP: 1.0000 | Freq: Every day | NASAL | Status: DC | PRN

## 2012-10-25 MED ORDER — CLONIDINE HCL 0.3 MG PO TABS: ORAL_TABLET | ORAL | Status: DC

## 2012-10-25 MED ORDER — SERTRALINE HCL 100 MG PO TABS: 100.0000 mg | ORAL_TABLET | Freq: Every day | ORAL | Status: DC

## 2012-10-25 MED ORDER — POTASSIUM CHLORIDE CRYS ER 10 MEQ PO TBCR: 10.0000 meq | EXTENDED_RELEASE_TABLET | Freq: Two times a day (BID) | ORAL | Status: DC

## 2012-10-25 MED ORDER — LORAZEPAM 1 MG PO TABS: ORAL_TABLET | ORAL | Status: DC

## 2012-10-25 NOTE — Progress Notes (Signed)
Subjective:    Patient ID: Beth Frey, female    DOB: June 19, 1955, 57 y.o.   MRN: [id number]  HPI here for followup/unable to schedule an appointment/leaving for [facility] next week  Stable meds at this point except for complaints of fatigue over the last 6 weeks Notices on arising/persists all day/no weight loss/ No fever chills night sweats/no acute illness although this followed a lot of travel, some stress at home, had a flulike illness several weeks ago.  Patient Active Problem List   Diagnosis Date Noted  . Hyperlipidemia 11/23/2011  . Vitamin d deficiency 11/23/2011  . History of abnormal cervical Pap smear 11/23/2011  . Anxiety disorder 11/23/2011  . Thyroid nodule 11/23/2011  . Skin cancer 11/23/2011  . Chronic hypokalemia 11/23/2011  . Incisional hernia 06/14/2011  Current outpatient prescriptions:cloNIDine (CATAPRES) 0.3 MG tablet, take 1 tablet by mouth three times a day, Disp: 90 tablet, Rfl: 5;  fluticasone (FLONASE) 50 MCG/ACT nasal spray, instill 1 to 2 sprays into each nostril once daily, Disp: 16 g, Rfl: 12;  LORazepam (ATIVAN) 1 MG tablet, take 1 tablet by mouth twice a day and 2 tablets at bedtime, Disp: 120 tablet, Rfl: 3 mometasone (NASONEX) 50 MCG/ACT nasal spray, Place 1 spray into the nose daily as needed. Seasonal allergies, Disp: 17 g, Rfl: 5;  ondansetron (ZOFRAN-ODT) 8 MG disintegrating tablet, Take 1 tablet (8 mg total) by mouth every 8 (eight) hours as needed., Disp: 30 tablet, Rfl: 0;  polyethylene glycol powder (MIRALAX) powder, Take 17 g by mouth daily., Disp: , Rfl:  potassium chloride (K-DUR,KLOR-CON) 10 MEQ tablet, Take 1 tablet (10 mEq total) by mouth 2 (two) times daily., Disp: 60 tablet, Rfl: 11;  sertraline (ZOLOFT) 100 MG tablet, Take 1 tablet (100 mg total) by mouth daily., Disp: 30 tablet, Rfl: 11;  Vitamin D, Ergocalciferol, (DRISDOL) 50000 UNITS CAPS, Take 1 capsule (50,000 Units total) by mouth every 7 (seven) days., Disp: 12 capsule, Rfl:  0 clindamycin (CLEOCIN) 300 MG capsule, Take 1 capsule (300 mg total) by mouth 3 (three) times daily., Disp: 21 capsule, Rfl: 0;  docusate sodium (COLACE) 250 MG capsule, Take 250 mg by mouth 2 (two) times daily., Disp: , Rfl: ;  doxycycline (VIBRAMYCIN) 100 MG capsule, Take 1 capsule (100 mg total) by mouth 2 (two) times daily., Disp: 14 capsule, Rfl: 0 HYDROcodone-acetaminophen (NORCO/VICODIN) 5-325 MG per tablet, Take 1-2 tablets by mouth every 6 (six) hours as needed for pain., Disp: 20 tablet, Rfl: 0;  Omega-3 Fatty Acids (FISH OIL) 1200 MG CAPS, Take 2 capsules by mouth daily.  , Disp: , Rfl:    Review of Systems  Constitutional: Negative for activity change, appetite change and unexpected weight change.  HENT: Negative for trouble swallowing, neck pain and neck stiffness.   Eyes: Negative for visual disturbance.  Respiratory: Negative for shortness of breath.   Cardiovascular: Negative for chest pain, palpitations and leg swelling.  Gastrointestinal: Negative for abdominal pain.  Endocrine: Negative for cold intolerance and heat intolerance.  Genitourinary: Negative for difficulty urinating.  Neurological: Negative for headaches.  Psychiatric/Behavioral:       Has a long history of sleep disorder with frequent wakening at this preceded any fatigue symptoms       Objective:   Physical Exam BP 122/90  Pulse 70  Temp(Src) 98.4 F (36.9 C) (Oral)  Resp 18  Ht 5\' 7"  (1.702 m)  Wt 143 lb (64.864 kg)  BMI 22.39 kg/m2  SpO2 99% HEENT clear with no thyromegaly  or nodules Heart regular without murmur Lungs clear No peripheral edema Mood good/affect appropriate       Assessment & Plan:  Depression - Plan: sertraline (ZOLOFT) 100 MG tablet  Hypokalemia - Plan: potassium chloride (K-DUR,KLOR-CON) 10 MEQ tablet, Comprehensive metabolic panel  Other malaise and fatigue - Plan: CBC with Differential, TSH, T4, free  Other and unspecified hyperlipidemia - Plan: Lipid panel  Meds  ordered this encounter  Medications  . cloNIDine (CATAPRES) 0.3 MG tablet    Sig: take 1 tablet by mouth three times a day    Dispense:  90 tablet    Refill:  5  . LORazepam (ATIVAN) 1 MG tablet    Sig: take 1 tablet by mouth twice a day and 2 tablets at bedtime    Dispense:  120 tablet    Refill:  3  . sertraline (ZOLOFT) 100 MG tablet    Sig: Take 1 tablet (100 mg total) by mouth daily.    Dispense:  30 tablet    Refill:  11  . ondansetron (ZOFRAN-ODT) 8 MG disintegrating tablet    Sig: Take 1 tablet (8 mg total) by mouth every 8 (eight) hours as needed.    Dispense:  30 tablet    Refill:  0    Order Specific Question:  Supervising Provider    Answer:  Twan Harkin P [[id number]]  . potassium chloride (K-DUR,KLOR-CON) 10 MEQ tablet    Sig: Take 1 tablet (10 mEq total) by mouth 2 (two) times daily.    Dispense:  60 tablet    Refill:  11  . mometasone (NASONEX) 50 MCG/ACT nasal spray    Sig: Place 1 spray into the nose daily as needed. Seasonal allergies    Dispense:  17 g    Refill:  5   Notify labs follow Dr. Cleta Alberts 6 months/

## 2012-10-26 ENCOUNTER — Telehealth: Payer: Self-pay | Admitting: Radiology

## 2012-10-26 LAB — TSH: TSH: 1.014 u[IU]/mL (ref 0.350–4.500)

## 2012-10-26 NOTE — Telephone Encounter (Signed)
Pt would like to know lab results. Explained to pt that we would have Merla Riches review and to await phone call.

## 2012-10-27 ENCOUNTER — Encounter: Payer: Self-pay | Admitting: Internal Medicine

## 2012-11-12 ENCOUNTER — Telehealth (INDEPENDENT_AMBULATORY_CARE_PROVIDER_SITE_OTHER): Payer: Self-pay | Admitting: *Deleted

## 2012-11-12 NOTE — Telephone Encounter (Signed)
Patient called in to report RLQ pain that began about 5-6 weeks ago progressively getting worse.  Patient states nothing increases or decreases the pain.  Patient also reports bloating and nausea but denies vomiting.  Patient states that beginning 8-9 days ago she started thinking she was becoming constipated so she began taking Ex-lax 3-4 tablets daily and is getting some results but still a small amount.  Patient had right hemicolectomy on 02/21/10 by Dr. Abbey Chatters.  Spoke to Dr. Abbey Chatters who suggests that patient try some Miralax to get the bowels moving starting with 1 capful per day and moving up as needed.  Spoke to patient to explain POC however patient states she disagrees that she is constipated.  [place] MD paged at this time to see what else we can offer patient.

## 2012-11-12 NOTE — Telephone Encounter (Signed)
Spoke to [employer] MD who ok'd to place patient on his schedule tomorrow morning to assess patient and symptoms.  Voicemail message left for patient with appt time.

## 2012-11-13 ENCOUNTER — Encounter (INDEPENDENT_AMBULATORY_CARE_PROVIDER_SITE_OTHER): Payer: Self-pay | Admitting: General Surgery

## 2012-11-13 ENCOUNTER — Other Ambulatory Visit (INDEPENDENT_AMBULATORY_CARE_PROVIDER_SITE_OTHER): Payer: Self-pay | Admitting: General Surgery

## 2012-11-13 ENCOUNTER — Other Ambulatory Visit: Payer: BC Managed Care – PPO

## 2012-11-13 ENCOUNTER — Ambulatory Visit (INDEPENDENT_AMBULATORY_CARE_PROVIDER_SITE_OTHER): Payer: BC Managed Care – PPO | Admitting: General Surgery

## 2012-11-13 ENCOUNTER — Ambulatory Visit
Admission: RE | Admit: 2012-11-13 | Discharge: 2012-11-13 | Disposition: A | Discharge status: Home / Self Care | Payer: BC Managed Care – PPO | Source: Ambulatory Visit | Attending: General Surgery | Admitting: General Surgery

## 2012-11-13 VITALS — BP 118/80 | HR 78 | Temp 97.7°F | Resp 17 | Ht 67.5 in | Wt 143.6 lb

## 2012-11-13 DIAGNOSIS — R1031 Right lower quadrant pain: Secondary | ICD-10-CM

## 2012-11-13 DIAGNOSIS — K5900 Constipation, unspecified: Secondary | ICD-10-CM | POA: Insufficient documentation

## 2012-11-13 MED ORDER — IOHEXOL 300 MG/ML  SOLN
100.0000 mL | Freq: Once | INTRAMUSCULAR | Status: AC | PRN
  Administered 2012-11-13: 100 mL via INTRAVENOUS

## 2012-11-13 NOTE — Progress Notes (Signed)
Patient ID: Beth Frey, female   DOB: Aug 06, 1955, 57 y.o.   MRN: [id number]  Chief Complaint  Patient presents with  . New Evaluation    RLQ pain possible due to surgery    HPI Beth Frey is a 57 y.o. female.   HPI  She presents today due to 2 persistent right lower quadrant abdominal pain and bloating. This has been going on sporadically for about 8 months it seems to be more persistent and now. She was on vacation and noted that she was having problems with constipation. She was taking some laxatives to help with this. The discomfort persists. No fever or chills. Some nausea is present. She has a history of constipation. She has been taking some Zofran for the constipation. Her husband is here with her.  Past Medical History  Diagnosis Date  . Back pain, chronic   . Cecal bascule   . Osteoarthritis   . Cancer     skin  . Anxiety     Past Surgical History  Procedure Laterality Date  . Wrist surgery  2009    right wrist  . Elbow surgery      left elbow  . Ovarian cyst removal      right  . Skin cancer excision      multiple-SCC and BCC  . Uterine ablation    . Tubal ligation    . Cholecystectomy    . Colon surgery  02/20/2010    Right hemicolectomy-cecal bascule  . Joint replacement  2006    Left total knee replacement  . Breast surgery  August 2011    Implants removed  . Ablation saphenous vein w/ rfa      Family History  Problem Relation Age of Onset  . Cancer Father     stomach cancer  . Cancer Brother     pancreatic cancer  . Stroke Maternal Grandmother   . Cancer Maternal Grandfather     lung cancer  . Cancer Paternal Grandmother     ovarian  . Heart disease Paternal Grandfather     Social History History  Substance Use Topics  . Smoking status: Never Smoker   . Smokeless tobacco: Not on file  . Alcohol Use: No    Allergies  Allergen Reactions  . Sulfa Antibiotics Itching and Swelling    Current Outpatient Prescriptions  Medication Sig  Dispense Refill  . cloNIDine (CATAPRES) 0.3 MG tablet take 1 tablet by mouth three times a day  90 tablet  5  . IRON-B12-VITAMINS PO Take by mouth.      Marland Kitchen LORazepam (ATIVAN) 1 MG tablet take 1 tablet by mouth twice a day and 2 tablets at bedtime  120 tablet  3  . mometasone (NASONEX) 50 MCG/ACT nasal spray Place 1 spray into the nose daily as needed. Seasonal allergies  17 g  5  . ondansetron (ZOFRAN-ODT) 8 MG disintegrating tablet Take 1 tablet (8 mg total) by mouth every 8 (eight) hours as needed.  30 tablet  0  . potassium chloride (K-DUR,KLOR-CON) 10 MEQ tablet Take 1 tablet (10 mEq total) by mouth 2 (two) times daily.  60 tablet  11  . sertraline (ZOLOFT) 100 MG tablet Take 1 tablet (100 mg total) by mouth daily.  30 tablet  11  . Vitamin D, Ergocalciferol, (DRISDOL) 50000 UNITS CAPS Take 1 capsule (50,000 Units total) by mouth every 7 (seven) days.  12 capsule  0   No current facility-administered medications for this visit.  Review of Systems Review of Systems  Constitutional: Negative for fever and chills.  Gastrointestinal: Positive for abdominal pain, constipation and abdominal distention. Negative for blood in stool.  Genitourinary: Negative for hematuria.    Blood pressure 118/80, pulse 78, temperature 97.7 F (36.5 C), temperature source Temporal, resp. rate 17, height 5' 7.5" (1.715 m), weight 143 lb 9.6 oz (65.137 kg).  Physical Exam Physical Exam  Constitutional: She appears well-developed and well-nourished. No distress.  She appears tired.  Abdominal: Soft. She exhibits no mass. There is tenderness (Mild and right lower quadrant area.).  Midline incision is present without obvious hernia. Lower transverse incision present without hernia.  Genitourinary:  No detectable inguinal hernias.    Data Reviewed Old notes.  Assessment    Right lower quadrant and groin pain with constipation. She is concerned because it is a similar discomfort she felt when she had the  volvulus that had to be operated on.     Plan    CT scan of the abdomen and pelvis to see if there is a partial obstruction or other mechanical issue. If this is unrevealing, will referred to gastroenterology for evaluation of her chronic constipation which could be the etiology of the discomfort as well.        Hadriel Northup J 11/13/2012, 12:52 PM

## 2012-11-13 NOTE — Patient Instructions (Signed)
We will arrange for you to have a CT scan and then call you with the result.

## 2012-11-14 ENCOUNTER — Encounter (INDEPENDENT_AMBULATORY_CARE_PROVIDER_SITE_OTHER): Payer: Self-pay | Admitting: General Surgery

## 2012-11-14 NOTE — Progress Notes (Unsigned)
Patient ID: Beth Frey, female   DOB: [birth year], 57 y.o.   MRN: [id number] CT ABDOMEN AND PELVIS WITH CONTRAST  Technique: Multidetector CT imaging of the abdomen and pelvis was  performed following the standard protocol during bolus  administration of intravenous contrast.  Contrast: OMNIPAQUE IOHEXOL 300 MG/ML SOLN  Comparison: C t abdomen and pelvis 06/16/2011.  Findings: The heart size is normal. No significant pleural or  pericardial effusion is present. A small hiatal hernia is evident.  The lung bases are clear without focal nodule, mass, or airspace  disease. No significant pleural or pericardial effusion is  present.  The liver and spleen are within normal limits. The stomach is  otherwise unremarkable. A duodenal diverticulum is present. No  significant inflammatory changes are associated. The pancreas is  unremarkable. The common bile duct and gallbladder are normal.  The adrenal glands are within normal limits bilaterally. The  kidneys kidneys and ureters are unremarkable. The urinary bladder  is mostly collapsed.  The sigmoid colon is mostly collapsed. There may be some  diverticula, but no associate inflammatory change. The patient is  status post partial right hemicolectomy. Lymph nodes within the  ileocolic ligament are stable. The patient has no history of  cancer. A small bowel intussusception is noted in the proximal  jejunum. This does not appear obstructive. This over a very short  segment and no follow-up is necessary. Minimal atherosclerotic  calcifications are noted in the aorta.  No other significant adenopathy is present. The uterus and adnexa  are within normal limits for age.  Bone windows demonstrate a vacuum disc at L5-S1. Mild disc disease  is present at both L4-5 and L5-S1. No focal lytic or blastic  lesions are present.  IMPRESSION:  1. No acute or focal lesion explain the patient's right lower  quadrant pain.  2. Status post right  hemicolectomy.  3. Lymph nodes the ileocolic ligament are stable and likely  reactive.  4. Mild lower lumbar disc disease.  Original Report Authenticated By: Marin Roberts, M.D.                       I reviewed the CT scan and discussed the result with her.  I explained to her that I do not see a obvious etiology for her pain based on the CT scan or by my clinical exam. She certainly could have some problems with constipation her irritable bowel. I have offered to get her consultation with Dr. Matthias Hughs but she said she would arrange for that herself.

## 2012-11-21 ENCOUNTER — Other Ambulatory Visit: Payer: Self-pay | Admitting: Physician Assistant

## 2012-11-28 ENCOUNTER — Encounter (HOSPITAL_BASED_OUTPATIENT_CLINIC_OR_DEPARTMENT_OTHER): Payer: Self-pay | Admitting: *Deleted

## 2012-11-28 ENCOUNTER — Emergency Department (HOSPITAL_BASED_OUTPATIENT_CLINIC_OR_DEPARTMENT_OTHER)
Admission: EM | Admit: 2012-11-28 | Discharge: 2012-11-29 | Disposition: A | Discharge status: Home / Self Care | Payer: BC Managed Care – PPO | Attending: Emergency Medicine | Admitting: Emergency Medicine

## 2012-11-28 DIAGNOSIS — Z8719 Personal history of other diseases of the digestive system: Secondary | ICD-10-CM | POA: Insufficient documentation

## 2012-11-28 DIAGNOSIS — M1990 Unspecified osteoarthritis, unspecified site: Secondary | ICD-10-CM | POA: Insufficient documentation

## 2012-11-28 DIAGNOSIS — Y929 Unspecified place or not applicable: Secondary | ICD-10-CM | POA: Insufficient documentation

## 2012-11-28 DIAGNOSIS — S2231XA Fracture of one rib, right side, initial encounter for closed fracture: Secondary | ICD-10-CM

## 2012-11-28 DIAGNOSIS — F411 Generalized anxiety disorder: Secondary | ICD-10-CM | POA: Insufficient documentation

## 2012-11-28 DIAGNOSIS — W1809XA Striking against other object with subsequent fall, initial encounter: Secondary | ICD-10-CM | POA: Insufficient documentation

## 2012-11-28 DIAGNOSIS — Z79899 Other long term (current) drug therapy: Secondary | ICD-10-CM | POA: Insufficient documentation

## 2012-11-28 DIAGNOSIS — Z85828 Personal history of other malignant neoplasm of skin: Secondary | ICD-10-CM | POA: Insufficient documentation

## 2012-11-28 DIAGNOSIS — Y939 Activity, unspecified: Secondary | ICD-10-CM | POA: Insufficient documentation

## 2012-11-28 DIAGNOSIS — IMO0002 Reserved for concepts with insufficient information to code with codable children: Secondary | ICD-10-CM | POA: Insufficient documentation

## 2012-11-28 DIAGNOSIS — S2249XA Multiple fractures of ribs, unspecified side, initial encounter for closed fracture: Secondary | ICD-10-CM | POA: Insufficient documentation

## 2012-11-28 MED ORDER — HYDROMORPHONE HCL PF 2 MG/ML IJ SOLN
2.0000 mg | Freq: Once | INTRAMUSCULAR | Status: AC
  Administered 2012-11-29: 2 mg via INTRAVENOUS
  Filled 2012-11-28: qty 1

## 2012-11-28 MED ORDER — ONDANSETRON HCL 4 MG/2ML IJ SOLN
4.0000 mg | Freq: Once | INTRAMUSCULAR | Status: AC
  Administered 2012-11-29: 4 mg via INTRAVENOUS
  Filled 2012-11-28: qty 2

## 2012-11-28 MED ORDER — KETOROLAC TROMETHAMINE 30 MG/ML IJ SOLN
30.0000 mg | Freq: Once | INTRAMUSCULAR | Status: AC
  Administered 2012-11-29: 30 mg via INTRAVENOUS
  Filled 2012-11-28: qty 1

## 2012-11-28 MED ORDER — SODIUM CHLORIDE 0.9 % IV BOLUS (SEPSIS)
500.0000 mL | Freq: Once | INTRAVENOUS | Status: AC
  Administered 2012-11-29: 500 mL via INTRAVENOUS

## 2012-11-28 NOTE — ED Notes (Signed)
Pt fell on deck and has upper back pain, hurts with movement and deep breath. Pt took 6mg  PO dilaudid PTA.

## 2012-11-29 ENCOUNTER — Emergency Department (HOSPITAL_BASED_OUTPATIENT_CLINIC_OR_DEPARTMENT_OTHER): Payer: BC Managed Care – PPO

## 2012-11-29 MED ORDER — METHOCARBAMOL 100 MG/ML IJ SOLN
INTRAMUSCULAR | Status: AC
  Administered 2012-11-29: 1000 mg
  Filled 2012-11-29: qty 10

## 2012-11-29 MED ORDER — HYDROMORPHONE HCL PF 2 MG/ML IJ SOLN
INTRAMUSCULAR | Status: AC
  Administered 2012-11-29: 2 mg via INTRAVENOUS
  Filled 2012-11-29: qty 1

## 2012-11-29 MED ORDER — ONDANSETRON 8 MG PO TBDP: 8.0000 mg | ORAL_TABLET | Freq: Once | ORAL | Status: AC

## 2012-11-29 MED ORDER — IBUPROFEN 800 MG PO TABS: 800.0000 mg | ORAL_TABLET | Freq: Three times a day (TID) | ORAL | Status: DC

## 2012-11-29 MED ORDER — METHOCARBAMOL 750 MG PO TABS: 750.0000 mg | ORAL_TABLET | Freq: Three times a day (TID) | ORAL | Status: DC

## 2012-11-29 MED ORDER — HYDROMORPHONE HCL PF 2 MG/ML IJ SOLN: 2.0000 mg | Freq: Once | INTRAMUSCULAR | Status: AC

## 2012-11-29 MED ORDER — ONDANSETRON 8 MG PO TBDP
ORAL_TABLET | ORAL | Status: AC
  Administered 2012-11-29: 8 mg via ORAL
  Filled 2012-11-29: qty 1

## 2012-11-29 MED ORDER — HYDROMORPHONE HCL 4 MG PO TABS: 4.0000 mg | ORAL_TABLET | ORAL | Status: DC | PRN

## 2012-11-29 NOTE — ED Provider Notes (Signed)
CSN: [id number]     Arrival date & time 11/28/12  2350 History     First MD Initiated Contact with Patient 11/28/12 2357     Chief Complaint  Patient presents with  . Fall   (Consider location/radiation/quality/duration/timing/severity/associated sxs/prior Treatment) Patient is a 57 y.o. female presenting with fall. The history is provided by the patient and the spouse.  Fall This is a new problem. The current episode started 6 to 12 hours ago. The problem occurs constantly. The problem has not changed since onset.Pertinent negatives include no abdominal pain, no headaches and no shortness of breath. Associated symptoms comments: Right posterior rib pain. Nothing aggravates the symptoms. Nothing relieves the symptoms. Treatments tried: PO dilaudid. The treatment provided no relief.    Past Medical History  Diagnosis Date  . Back pain, chronic   . Cecal bascule   . Osteoarthritis   . Cancer     skin  . Anxiety    Past Surgical History  Procedure Laterality Date  . Wrist surgery  2009    right wrist  . Elbow surgery      left elbow  . Ovarian cyst removal      right  . Skin cancer excision      multiple-SCC and BCC  . Uterine ablation    . Tubal ligation    . Cholecystectomy    . Colon surgery  02/20/2010    Right hemicolectomy-cecal bascule  . Joint replacement  2006    Left total knee replacement  . Breast surgery  August 2011    Implants removed  . Ablation saphenous vein w/ rfa     Family History  Problem Relation Age of Onset  . Cancer Father     stomach cancer  . Cancer Brother     pancreatic cancer  . Stroke Maternal Grandmother   . Cancer Maternal Grandfather     lung cancer  . Cancer Paternal Grandmother     ovarian  . Heart disease Paternal Grandfather    History  Substance Use Topics  . Smoking status: Never Smoker   . Smokeless tobacco: Not on file  . Alcohol Use: No   OB History   Grav Para Term Preterm Abortions TAB SAB Ect Mult Living                Review of Systems  Respiratory: Negative for shortness of breath.   Gastrointestinal: Negative for abdominal pain.  Neurological: Negative for headaches.  All other systems reviewed and are negative.    Allergies  Sulfa antibiotics  Home Medications   Current Outpatient Rx  Name  Route  Sig  Dispense  Refill  . cloNIDine (CATAPRES) 0.3 MG tablet      take 1 tablet by mouth three times a day   90 tablet   5   . IRON-B12-VITAMINS PO   Oral   Take by mouth.         Marland Kitchen LORazepam (ATIVAN) 1 MG tablet      take 1 tablet by mouth twice a day and 2 tablets at bedtime   120 tablet   3   . mometasone (NASONEX) 50 MCG/ACT nasal spray   Nasal   Place 1 spray into the nose daily as needed. Seasonal allergies   17 g   5   . potassium chloride (K-DUR,KLOR-CON) 10 MEQ tablet   Oral   Take 1 tablet (10 mEq total) by mouth 2 (two) times daily.   60 tablet  11   . sertraline (ZOLOFT) 100 MG tablet   Oral   Take 1 tablet (100 mg total) by mouth daily.   30 tablet   11   . Vitamin D, Ergocalciferol, (DRISDOL) 50000 UNITS CAPS      take 1 capsule by mouth every week   12 capsule   3   . ondansetron (ZOFRAN-ODT) 8 MG disintegrating tablet   Oral   Take 1 tablet (8 mg total) by mouth every 8 (eight) hours as needed.   30 tablet   0    BP 138/81  Pulse 82  Temp(Src) 98.3 F (36.8 C) (Oral)  SpO2 100% Physical Exam  Constitutional: She is oriented to person, place, and time. She appears well-developed and well-nourished.  HENT:  Head: Normocephalic and atraumatic.  Mouth/Throat: Oropharynx is clear and moist.  Eyes: Conjunctivae are normal. Pupils are equal, round, and reactive to light.  Neck: Normal range of motion. Neck supple.  Cardiovascular: Normal rate, regular rhythm and intact distal pulses.   Pulmonary/Chest: She has no wheezes. She has no rales.    Abdominal: Soft. Bowel sounds are normal. There is no tenderness.  Musculoskeletal:  Normal range of motion.  Neurological: She is alert and oriented to person, place, and time.  Skin: Skin is warm and dry. She is not diaphoretic.  Psychiatric: She has a normal mood and affect.    ED Course   Procedures (including critical care time)  Labs Reviewed - No data to display No results found. No diagnosis found. Results for orders placed in visit on 10/25/12  CBC WITH DIFFERENTIAL      Result Value Range   WBC 8.7  4.0 - 10.5 K/uL   RBC 4.96  3.87 - 5.11 MIL/uL   Hemoglobin 15.3 (*) 12.0 - 15.0 g/dL   HCT 62.1  30.8 - 65.7 %   MCV 88.5  78.0 - 100.0 fL   MCH 30.8  26.0 - 34.0 pg   MCHC 34.9  30.0 - 36.0 g/dL   RDW 84.6  96.2 - 95.2 %   Platelets 260  150 - 400 K/uL   Neutrophils Relative % 47  43 - 77 %   Neutro Abs 4.1  1.7 - 7.7 K/uL   Lymphocytes Relative 41  12 - 46 %   Lymphs Abs 3.5  0.7 - 4.0 K/uL   Monocytes Relative 10  3 - 12 %   Monocytes Absolute 0.9  0.1 - 1.0 K/uL   Eosinophils Relative 2  0 - 5 %   Eosinophils Absolute 0.2  0.0 - 0.7 K/uL   Basophils Relative 0  0 - 1 %   Basophils Absolute 0.0  0.0 - 0.1 K/uL   Smear Review Criteria for review not met    COMPREHENSIVE METABOLIC PANEL      Result Value Range   Sodium 137  135 - 145 mEq/L   Potassium 3.6  3.5 - 5.3 mEq/L   Chloride 100  96 - 112 mEq/L   CO2 28  19 - 32 mEq/L   Glucose, Bld 103 (*) 70 - 99 mg/dL   BUN 13  6 - 23 mg/dL   Creat 8.41  3.24 - 4.01 mg/dL   Total Bilirubin 0.8  0.3 - 1.2 mg/dL   Alkaline Phosphatase 53  39 - 117 U/L   AST 29  0 - 37 U/L   ALT 28  0 - 35 U/L   Total Protein 7.5  6.0 - 8.3 g/dL  Albumin 4.3  3.5 - 5.2 g/dL   Calcium 9.9  8.4 - 21.3 mg/dL  LIPID PANEL      Result Value Range   Cholesterol 298 (*) 0 - 200 mg/dL   Triglycerides 086  <578 mg/dL   HDL 76  >46 mg/dL   Total CHOL/HDL Ratio 3.9     VLDL 29  0 - 40 mg/dL   LDL Cholesterol 962 (*) 0 - 99 mg/dL  TSH      Result Value Range   TSH 1.014  0.350 - 4.500 uIU/mL  T4, FREE      Result  Value Range   Free T4 1.18  0.80 - 1.80 ng/dL   Dg Ribs Unilateral W/chest Right  11/29/2012   *RADIOLOGY REPORT*  Clinical Data: Fall.  Right lower posterior rib pain.  RIGHT RIBS AND CHEST - 3+ VIEW  Comparison: CT abdomen and pelvis with contrast 11/13/2012.  Findings: The heart size is normal.  Mild interstitial coarsening is present in the lungs.  Dedicated imaging of the ribs demonstrates a minimal it is placed fracture in the posterior right seventh and eighth ribs.  There is no pneumothorax.  IMPRESSION:  1.  Minimally-displaced fractures of the posterior right seventh and eighth rib without pneumothorax. 2.  Mild chronic interstitial coarsening.   Original Report Authenticated By: Marin Roberts, M.D.   Ct Abdomen Pelvis W Contrast  11/13/2012   *RADIOLOGY REPORT*  Clinical Data: Right lower quadrant pain and bloating.  CT ABDOMEN AND PELVIS WITH CONTRAST  Technique:  Multidetector CT imaging of the abdomen and pelvis was performed following the standard protocol during bolus administration of intravenous contrast.  Contrast: OMNIPAQUE IOHEXOL 300 MG/ML  SOLN  Comparison: C t abdomen and pelvis 06/16/2011.  Findings: The heart size is normal.  No significant pleural or pericardial effusion is present.  A small hiatal hernia is evident. The lung bases are clear without focal nodule, mass, or airspace disease.  No significant pleural or pericardial effusion is present.  The liver and spleen are within normal limits.  The stomach is otherwise unremarkable. A duodenal diverticulum is present.  No significant inflammatory changes are associated.  The pancreas is unremarkable.  The common bile duct and gallbladder are normal. The adrenal glands are within normal limits bilaterally.  The kidneys kidneys and ureters are unremarkable.  The urinary bladder is mostly collapsed.  The sigmoid colon is mostly collapsed.  There may be some diverticula, but no associate inflammatory change.  The patient is  status post partial right hemicolectomy.  Lymph nodes within the ileocolic ligament are stable.  The patient has no history of cancer.  A small bowel intussusception is noted in the proximal jejunum.  This does not appear obstructive.  This over a very short segment and no follow-up is necessary.  Minimal atherosclerotic calcifications are noted in the aorta.  No other significant adenopathy is present.  The uterus and adnexa are within normal limits for age.  Bone windows demonstrate a vacuum disc at L5-S1.  Mild disc disease is present at both L4-5 and L5-S1.  No focal lytic or blastic lesions are present.  IMPRESSION:  1.  No acute or focal lesion explain the patient's right lower quadrant pain. 2.  Status post right hemicolectomy. 3.  Lymph nodes the ileocolic ligament are stable and likely reactive. 4.  Mild lower lumbar disc disease.   Original Report Authenticated By: Marin Roberts, M.D.   Medications  HYDROmorphone (DILAUDID) injection 2 mg (2  mg Intravenous Given 11/29/12 0004)  ketorolac (TORADOL) 30 MG/ML injection 30 mg (30 mg Intravenous Given 11/29/12 0004)  ondansetron (ZOFRAN) injection 4 mg (4 mg Intravenous Given 11/29/12 0004)  sodium chloride 0.9 % bolus 500 mL (0 mLs Intravenous Stopped 11/29/12 0125)  methocarbamol (ROBAXIN) 100 MG/ML injection (1,000 mg  Given 11/29/12 0006)  HYDROmorphone (DILAUDID) injection 2 mg (2 mg Intravenous Given 11/29/12 0056)     MDM  Rib fractures:  Pain markedly improved post medication.  Will d/c with dilaudid, robaxin, and ibuprofen.  Return for any worsening symptoms  Nikko Quast K Yezenia Fredrick-Rasch, MD 11/29/12 [phone]

## 2012-11-29 NOTE — ED Notes (Signed)
Patient transported to X-ray 

## 2012-11-29 NOTE — ED Notes (Signed)
MD at bedside. 

## 2013-03-13 ENCOUNTER — Other Ambulatory Visit: Payer: Self-pay

## 2013-04-10 ENCOUNTER — Telehealth: Payer: Self-pay

## 2013-04-10 NOTE — Telephone Encounter (Signed)
Patient says she needs refill on Klonopin. She is prescribed 3 per day but she takes 4 per day because she is having a hard time right now. Her best friend is about to have surgery at [facility] and her brother is dying from pancreatic cancer. She wont be able to come back until a few weeks from now because she will be with her friend for surgery. Can she get enough until she can come back into the office? Uses [facility] [phone] pharmacy #) and [phone] (patient call back #) sees Dr Cleta Alberts.

## 2013-04-11 NOTE — Telephone Encounter (Signed)
Okay to refill her medication early but she needs to get in to see me at 104.

## 2013-04-14 NOTE — Telephone Encounter (Signed)
She is on Ativan from Dr Merla Riches, I do not see a Rx for Klonopin, I do see Clonidine, this is tid? Need to verify medication. Left message for her to call me back.

## 2013-04-17 NOTE — Telephone Encounter (Signed)
Called again, patient has not responded to my calls.

## 2013-05-19 ENCOUNTER — Other Ambulatory Visit: Payer: Self-pay | Admitting: Internal Medicine

## 2013-05-19 NOTE — Telephone Encounter (Signed)
Pt last seen in June 14. Please advise refill.

## 2013-05-19 NOTE — Telephone Encounter (Signed)
Faxed and advised pt OV due. Transferred her to 104.

## 2013-05-19 NOTE — Telephone Encounter (Signed)
Rx printed and signed.  Per last OV note, pt is due for follow up with Dr. Everlene Farrier

## 2013-06-11 ENCOUNTER — Other Ambulatory Visit: Payer: Self-pay | Admitting: Internal Medicine

## 2013-06-18 ENCOUNTER — Other Ambulatory Visit (HOSPITAL_BASED_OUTPATIENT_CLINIC_OR_DEPARTMENT_OTHER): Payer: Self-pay | Admitting: Emergency Medicine

## 2013-06-18 ENCOUNTER — Ambulatory Visit (HOSPITAL_BASED_OUTPATIENT_CLINIC_OR_DEPARTMENT_OTHER)
Admission: RE | Admit: 2013-06-18 | Discharge: 2013-06-18 | Disposition: A | Discharge status: Home / Self Care | Payer: BC Managed Care – PPO | Source: Ambulatory Visit | Attending: Emergency Medicine | Admitting: Emergency Medicine

## 2013-06-18 DIAGNOSIS — R102 Pelvic and perineal pain: Secondary | ICD-10-CM

## 2013-06-18 DIAGNOSIS — R1032 Left lower quadrant pain: Secondary | ICD-10-CM | POA: Insufficient documentation

## 2013-06-18 DIAGNOSIS — R109 Unspecified abdominal pain: Secondary | ICD-10-CM | POA: Insufficient documentation

## 2013-07-23 ENCOUNTER — Other Ambulatory Visit: Payer: Self-pay | Admitting: Internal Medicine

## 2013-07-23 ENCOUNTER — Ambulatory Visit (INDEPENDENT_AMBULATORY_CARE_PROVIDER_SITE_OTHER): Payer: BC Managed Care – PPO | Admitting: Family Medicine

## 2013-07-23 VITALS — BP 122/78 | HR 83 | Temp 98.0°F | Resp 16 | Ht 67.0 in | Wt 153.0 lb

## 2013-07-23 DIAGNOSIS — F3289 Other specified depressive episodes: Secondary | ICD-10-CM

## 2013-07-23 DIAGNOSIS — F411 Generalized anxiety disorder: Secondary | ICD-10-CM

## 2013-07-23 DIAGNOSIS — J302 Other seasonal allergic rhinitis: Secondary | ICD-10-CM

## 2013-07-23 DIAGNOSIS — F329 Major depressive disorder, single episode, unspecified: Secondary | ICD-10-CM

## 2013-07-23 DIAGNOSIS — F419 Anxiety disorder, unspecified: Secondary | ICD-10-CM

## 2013-07-23 DIAGNOSIS — E785 Hyperlipidemia, unspecified: Secondary | ICD-10-CM

## 2013-07-23 DIAGNOSIS — F32A Depression, unspecified: Secondary | ICD-10-CM

## 2013-07-23 DIAGNOSIS — J9801 Acute bronchospasm: Secondary | ICD-10-CM

## 2013-07-23 DIAGNOSIS — E876 Hypokalemia: Secondary | ICD-10-CM

## 2013-07-23 DIAGNOSIS — E559 Vitamin D deficiency, unspecified: Secondary | ICD-10-CM

## 2013-07-23 LAB — POCT CBC
Granulocyte percent: 55.6 %G (ref 37–80)
HCT, POC: 45.2 % (ref 37.7–47.9)
HEMOGLOBIN: 14.8 g/dL (ref 12.2–16.2)
Lymph, poc: 3.6 — AB (ref 0.6–3.4)
MCH: 31.2 pg (ref 27–31.2)
MCHC: 32.7 g/dL (ref 31.8–35.4)
MCV: 95.4 fL (ref 80–97)
MID (CBC): 0.8 (ref 0–0.9)
MPV: 9.6 fL (ref 0–99.8)
POC Granulocyte: 5.4 (ref 2–6.9)
POC LYMPH PERCENT: 36.4 %L (ref 10–50)
POC MID %: 8 % (ref 0–12)
Platelet Count, POC: 355 10*3/uL (ref 142–424)
RBC: 4.74 M/uL (ref 4.04–5.48)
RDW, POC: 13.7 %
WBC: 9.8 10*3/uL (ref 4.6–10.2)

## 2013-07-23 MED ORDER — LORAZEPAM 1 MG PO TABS: 1.0000 mg | ORAL_TABLET | Freq: Four times a day (QID) | ORAL | Status: DC | PRN

## 2013-07-23 MED ORDER — ONDANSETRON 8 MG PO TBDP: 8.0000 mg | ORAL_TABLET | Freq: Three times a day (TID) | ORAL | Status: DC | PRN

## 2013-07-23 MED ORDER — VITAMIN D (ERGOCALCIFEROL) 1.25 MG (50000 UNIT) PO CAPS: 50000.0000 [IU] | ORAL_CAPSULE | ORAL | Status: DC

## 2013-07-23 MED ORDER — SERTRALINE HCL 100 MG PO TABS: 150.0000 mg | ORAL_TABLET | Freq: Every day | ORAL | Status: DC

## 2013-07-23 MED ORDER — CLONIDINE HCL 0.3 MG PO TABS: ORAL_TABLET | ORAL | Status: DC

## 2013-07-23 MED ORDER — POTASSIUM CHLORIDE CRYS ER 10 MEQ PO TBCR: 10.0000 meq | EXTENDED_RELEASE_TABLET | Freq: Two times a day (BID) | ORAL | Status: DC

## 2013-07-23 MED ORDER — ALBUTEROL SULFATE HFA 108 (90 BASE) MCG/ACT IN AERS: 2.0000 | INHALATION_SPRAY | Freq: Four times a day (QID) | RESPIRATORY_TRACT | Status: DC | PRN

## 2013-07-23 MED ORDER — MOMETASONE FUROATE 50 MCG/ACT NA SUSP: 1.0000 | Freq: Every day | NASAL | Status: DC | PRN

## 2013-07-23 NOTE — Progress Notes (Signed)
Subjective:   This chart was scribed for Beth Honour, MD by Forrestine Him, [facility] Scribe. This patient was seen in room 9 and the patient's care was started 5:10 PM.    Patient ID: Beth Frey, female    DOB: 09/21/1955, 58 y.o.   MRN: [id number]  07/23/2013  Medication Refill   HPI  HPI Comments: Beth Frey is a 58 y.o. female who presents to [facility] for a medication refill today for Ativan and Zofran. Requesting to increase Zoloft today as well. She is currently taking Clonidine, Ativan, Zofran, Zoloft, vitamin D, and Potassium Chloride.  Requesting to have her cholesterol checked during today's visit.  Denies a history of asthma.   Pt states her brother is currently in his last stages of life, and she is caring for him at home. She states she has been under a lot of stress lately related to this.  Last seen by Dr. Laney Pastor 10/25/12. She is followed by Dr. Everlene Farrier, and is seen about every 6 months.  Her PMHx includes chronic back pain, skin cancer, anxiety, osteoarthritis.  Review of Systems  Constitutional: Negative for fever, chills, diaphoresis and fatigue.  HENT: Negative for congestion.   Eyes: Negative for redness and visual disturbance.  Respiratory: Positive for wheezing. Negative for cough and shortness of breath.   Cardiovascular: Negative for chest pain, palpitations and leg swelling.  Gastrointestinal: Negative for nausea, vomiting, abdominal pain, diarrhea and constipation.  Endocrine: Negative for cold intolerance, heat intolerance, polydipsia, polyphagia and polyuria.  Genitourinary: Negative for dysuria.  Skin: Negative for rash.  Neurological: Negative for dizziness, tremors, seizures, syncope, facial asymmetry, speech difficulty, weakness, light-headedness, numbness and headaches.  Psychiatric/Behavioral: The patient is nervous/anxious.     Past Medical History  Diagnosis Date  . Back pain, chronic   .  Cecal bascule   . Osteoarthritis   . Cancer     skin  . Anxiety    Allergies  Allergen Reactions  . Sulfa Antibiotics Itching and Swelling    Throat, body swelling  . Doxycycline     Sun    Current Outpatient Prescriptions  Medication Sig Dispense Refill  . Vitamin D, Ergocalciferol, (DRISDOL) 50000 UNITS CAPS capsule Take 1 capsule (50,000 Units total) by mouth every 7 (seven) days. 4 capsule 5  . cloNIDine (CATAPRES) 0.2 MG tablet Take 1 tablet (0.2 mg total) by mouth 3 (three) times daily. 90 tablet 2  . LORazepam (ATIVAN) 1 MG tablet take 1 tablet by mouth every 6 hours if needed for anxiety 120 tablet 0  . mometasone (NASONEX) 50 MCG/ACT nasal spray Place 2 sprays into the nose daily as needed (allergies).     . ondansetron (ZOFRAN-ODT) 8 MG disintegrating tablet Take 8 mg by mouth every 8 (eight) hours as needed for nausea or vomiting.    . polyethylene glycol (MIRALAX / GLYCOLAX) packet Take 17 g by mouth daily as needed.     . potassium chloride SA (K-DUR,KLOR-CON) 20 MEQ tablet Take 1 tablet (20 mEq total) by mouth daily. 30 tablet 3  . sertraline (ZOLOFT) 100 MG tablet Take 100 mg by mouth every other day.     . sertraline (ZOLOFT) 100 MG tablet Take 1 and 1/2 tablets by mouth once daily. PATIENT NEEDS OFFICE VISIT FOR ADDITIONAL REFILLS 45 tablet 0   No current facility-administered medications for this visit.       Objective:    BP 122/78  Pulse 83  Temp(Src) 98 F (36.7 C) (Oral)  Resp 16  Ht 5\' 7"  (1.702 m)  Wt 153 lb (69.4 kg)  BMI 23.96 kg/m2  SpO2 100%  Physical Exam  Constitutional: She is oriented to person, place, and time. She appears well-developed and well-nourished. No distress.  HENT:  Head: Normocephalic and atraumatic.  Eyes: Conjunctivae and EOM are normal. Pupils are equal, round, and reactive to light.  Neck: Normal range of motion. Neck supple. No thyromegaly present.  Cardiovascular: Normal rate, regular rhythm and normal heart sounds.   Exam reveals no gallop and no friction rub.   No murmur heard. Pulmonary/Chest: Effort normal. No respiratory distress. She has wheezes. She has no rales.  Musculoskeletal: Normal range of motion.  Lymphadenopathy:    She has no cervical adenopathy.  Neurological: She is alert and oriented to person, place, and time.  Skin: Skin is warm and dry. She is not diaphoretic.  Psychiatric: She has a normal mood and affect. Her behavior is normal. Judgment and thought content normal.  Nursing note and vitals reviewed.  Results for orders placed or performed in visit on 07/23/13  Comprehensive metabolic panel  Result Value Ref Range   Sodium 136 135 - 145 mEq/L   Potassium 3.5 3.5 - 5.3 mEq/L   Chloride 103 96 - 112 mEq/L   CO2 21 19 - 32 mEq/L   Glucose, Bld 100 (H) 70 - 99 mg/dL   BUN 20 6 - 23 mg/dL   Creat 0.68 0.50 - 1.10 mg/dL   Total Bilirubin 0.4 0.2 - 1.2 mg/dL   Alkaline Phosphatase 49 39 - 117 U/L   AST 19 0 - 37 U/L   ALT 20 0 - 35 U/L   Total Protein 7.0 6.0 - 8.3 g/dL   Albumin 4.4 3.5 - 5.2 g/dL   Calcium 9.5 8.4 - 10.5 mg/dL  Vit D  25 hydroxy (rtn osteoporosis monitoring)  Result Value Ref Range   Vit D, 25-Hydroxy 59 30 - 89 ng/mL  Lipid panel  Result Value Ref Range   Cholesterol 269 (H) 0 - 200 mg/dL   Triglycerides 244 (H) <150 mg/dL   HDL 64 >39 mg/dL   Total CHOL/HDL Ratio 4.2 Ratio   VLDL 49 (H) 0 - 40 mg/dL   LDL Cholesterol 156 (H) 0 - 99 mg/dL  POCT CBC  Result Value Ref Range   WBC 9.8 4.6 - 10.2 K/uL   Lymph, poc 3.6 (A) 0.6 - 3.4   POC LYMPH PERCENT 36.4 10 - 50 %L   MID (cbc) 0.8 0 - 0.9   POC MID % 8.0 0 - 12 %M   POC Granulocyte 5.4 2 - 6.9   Granulocyte percent 55.6 37 - 80 %G   RBC 4.74 4.04 - 5.48 M/uL   Hemoglobin 14.8 12.2 - 16.2 g/dL   HCT, POC 45.2 37.7 - 47.9 %   MCV 95.4 80 - 97 fL   MCH, POC 31.2 27 - 31.2 pg   MCHC 32.7 31.8 - 35.4 g/dL   RDW, POC 13.7 %   Platelet Count, POC 355 142 - 424 K/uL   MPV 9.6 0 - 99.8 fL         Assessment & Plan:  Hypokalemia - Plan: POCT CBC, Comprehensive metabolic panel, DISCONTINUED: potassium chloride (K-DUR,KLOR-CON) 10 MEQ tablet  Depression - Plan: POCT CBC, DISCONTINUED: sertraline (ZOLOFT) 100 MG tablet  Hyperlipidemia - Plan: Lipid panel  Vitamin D deficiency - Plan: Vit D  25 hydroxy (rtn  osteoporosis monitoring)  Anxiety disorder  Chronic hypokalemia - Plan: POCT CBC, Comprehensive metabolic panel  Bronchospasm  Other seasonal allergic rhinitis   1. Hypokalemia: stable; obtain labs; refill proivded. 2.  Depression with anxiety:  Worsening; increase Zoloft to 200mg  daily; refill of benzo provided.  Follow-up with PCP in upcoming three months.   3.  Vitamin D deficiency: stable; obtain labs; continue supplementation. 4.  Bronchospasm: New.  Suggestive of underlying asthma or COPD.  Reports history of intermittent wheezing.  Refill of Albuterol provided.   5. Allergic Rhinitis: stable; refill of Nasonex provided.   Meds ordered this encounter  Medications  . DISCONTD: cloNIDine (CATAPRES) 0.3 MG tablet    Sig: take 1 tablet by mouth three times a day    Dispense:  90 tablet    Refill:  5  . DISCONTD: mometasone (NASONEX) 50 MCG/ACT nasal spray    Sig: Place 1 spray into the nose daily as needed. Seasonal allergies    Dispense:  17 g    Refill:  5  . DISCONTD: ondansetron (ZOFRAN-ODT) 8 MG disintegrating tablet    Sig: Take 1 tablet (8 mg total) by mouth every 8 (eight) hours as needed.    Dispense:  21 tablet    Refill:  4    Order Specific Question:  Supervising Provider    Answer:  DOOLITTLE, ROBERT P [id number]  . DISCONTD: potassium chloride (K-DUR,KLOR-CON) 10 MEQ tablet    Sig: Take 1 tablet (10 mEq total) by mouth 2 (two) times daily.    Dispense:  60 tablet    Refill:  5  . DISCONTD: sertraline (ZOLOFT) 100 MG tablet    Sig: Take 1.5 tablets (150 mg total) by mouth daily.    Dispense:  45 tablet    Refill:  5  . Vitamin D, Ergocalciferol,  (DRISDOL) 50000 UNITS CAPS capsule    Sig: Take 1 capsule (50,000 Units total) by mouth every 7 (seven) days.    Dispense:  4 capsule    Refill:  5  . DISCONTD: LORazepam (ATIVAN) 1 MG tablet    Sig: Take 1 tablet (1 mg total) by mouth every 6 (six) hours as needed for anxiety.    Dispense:  120 tablet    Refill:  5  . DISCONTD: albuterol (PROVENTIL HFA;VENTOLIN HFA) 108 (90 BASE) MCG/ACT inhaler    Sig: Inhale 2 puffs into the lungs every 6 (six) hours as needed for wheezing or shortness of breath.    Dispense:  1 Inhaler    Refill:  2    No Follow-up on file.    I personally performed the services described in this documentation, which was scribed in my presence.  The recorded information has been reviewed and is accurate.  Reginia Forts, M.D.  [facility] [address] [place], [place]  [phone] phone [phone] fax

## 2013-07-24 LAB — LIPID PANEL
CHOLESTEROL: 269 mg/dL — AB (ref 0–200)
HDL: 64 mg/dL (ref >39)
LDL Cholesterol: 156 mg/dL — ABNORMAL HIGH (ref 0–99)
Total CHOL/HDL Ratio: 4.2 Ratio
Triglycerides: 244 mg/dL — ABNORMAL HIGH (ref <150)
VLDL: 49 mg/dL — ABNORMAL HIGH (ref 0–40)

## 2013-07-24 LAB — COMPREHENSIVE METABOLIC PANEL
ALT: 20 U/L (ref 0–35)
AST: 19 U/L (ref 0–37)
Albumin: 4.4 g/dL (ref 3.5–5.2)
Alkaline Phosphatase: 49 U/L (ref 39–117)
BILIRUBIN TOTAL: 0.4 mg/dL (ref 0.2–1.2)
BUN: 20 mg/dL (ref 6–23)
CO2: 21 meq/L (ref 19–32)
Calcium: 9.5 mg/dL (ref 8.4–10.5)
Chloride: 103 mEq/L (ref 96–112)
Creat: 0.68 mg/dL (ref 0.50–1.10)
GLUCOSE: 100 mg/dL — AB (ref 70–99)
Potassium: 3.5 mEq/L (ref 3.5–5.3)
Sodium: 136 mEq/L (ref 135–145)
Total Protein: 7 g/dL (ref 6.0–8.3)

## 2013-07-25 LAB — VITAMIN D 25 HYDROXY (VIT D DEFICIENCY, FRACTURES): VIT D 25 HYDROXY: 59 ng/mL (ref 30–89)

## 2013-07-27 ENCOUNTER — Encounter: Payer: Self-pay | Admitting: Family Medicine

## 2013-10-19 DIAGNOSIS — IMO0001 Reserved for inherently not codable concepts without codable children: Secondary | ICD-10-CM | POA: Insufficient documentation

## 2013-10-19 DIAGNOSIS — S81809A Unspecified open wound, unspecified lower leg, initial encounter: Principal | ICD-10-CM

## 2013-10-19 DIAGNOSIS — Z23 Encounter for immunization: Secondary | ICD-10-CM | POA: Insufficient documentation

## 2013-10-19 DIAGNOSIS — S91009A Unspecified open wound, unspecified ankle, initial encounter: Principal | ICD-10-CM

## 2013-10-19 DIAGNOSIS — IMO0002 Reserved for concepts with insufficient information to code with codable children: Secondary | ICD-10-CM | POA: Insufficient documentation

## 2013-10-19 DIAGNOSIS — Z8719 Personal history of other diseases of the digestive system: Secondary | ICD-10-CM | POA: Insufficient documentation

## 2013-10-19 DIAGNOSIS — Y9289 Other specified places as the place of occurrence of the external cause: Secondary | ICD-10-CM | POA: Insufficient documentation

## 2013-10-19 DIAGNOSIS — Z8739 Personal history of other diseases of the musculoskeletal system and connective tissue: Secondary | ICD-10-CM | POA: Insufficient documentation

## 2013-10-19 DIAGNOSIS — S81009A Unspecified open wound, unspecified knee, initial encounter: Secondary | ICD-10-CM | POA: Insufficient documentation

## 2013-10-19 DIAGNOSIS — Z79899 Other long term (current) drug therapy: Secondary | ICD-10-CM | POA: Insufficient documentation

## 2013-10-19 DIAGNOSIS — Z85828 Personal history of other malignant neoplasm of skin: Secondary | ICD-10-CM | POA: Insufficient documentation

## 2013-10-19 DIAGNOSIS — Y9389 Activity, other specified: Secondary | ICD-10-CM | POA: Insufficient documentation

## 2013-10-19 DIAGNOSIS — Z792 Long term (current) use of antibiotics: Secondary | ICD-10-CM | POA: Insufficient documentation

## 2013-10-19 DIAGNOSIS — S8010XA Contusion of unspecified lower leg, initial encounter: Secondary | ICD-10-CM | POA: Insufficient documentation

## 2013-10-19 DIAGNOSIS — F411 Generalized anxiety disorder: Secondary | ICD-10-CM | POA: Insufficient documentation

## 2013-10-19 NOTE — ED Notes (Signed)
Pt reports animal bite by her cat to [facility] that she could not get the bleeding controlled

## 2013-10-20 ENCOUNTER — Encounter (HOSPITAL_BASED_OUTPATIENT_CLINIC_OR_DEPARTMENT_OTHER): Payer: Self-pay | Admitting: Emergency Medicine

## 2013-10-20 ENCOUNTER — Emergency Department (HOSPITAL_BASED_OUTPATIENT_CLINIC_OR_DEPARTMENT_OTHER)
Admission: EM | Admit: 2013-10-20 | Discharge: 2013-10-20 | Disposition: A | Discharge status: Home / Self Care | Payer: BC Managed Care – PPO | Attending: Emergency Medicine | Admitting: Emergency Medicine

## 2013-10-20 ENCOUNTER — Emergency Department (HOSPITAL_BASED_OUTPATIENT_CLINIC_OR_DEPARTMENT_OTHER): Payer: BC Managed Care – PPO

## 2013-10-20 DIAGNOSIS — W5501XA Bitten by cat, initial encounter: Secondary | ICD-10-CM

## 2013-10-20 LAB — CBC WITH DIFFERENTIAL/PLATELET
BASOS ABS: 0 10*3/uL (ref 0.0–0.1)
Basophils Relative: 0 % (ref 0–1)
EOS PCT: 4 % (ref 0–5)
Eosinophils Absolute: 0.3 10*3/uL (ref 0.0–0.7)
HCT: 41.6 % (ref 36.0–46.0)
Hemoglobin: 14.6 g/dL (ref 12.0–15.0)
LYMPHS ABS: 3.3 10*3/uL (ref 0.7–4.0)
LYMPHS PCT: 40 % (ref 12–46)
MCH: 33.3 pg (ref 26.0–34.0)
MCHC: 35.1 g/dL (ref 30.0–36.0)
MCV: 95 fL (ref 78.0–100.0)
Monocytes Absolute: 0.6 10*3/uL (ref 0.1–1.0)
Monocytes Relative: 7 % (ref 3–12)
Neutro Abs: 4.1 10*3/uL (ref 1.7–7.7)
Neutrophils Relative %: 50 % (ref 43–77)
Platelets: 288 10*3/uL (ref 150–400)
RBC: 4.38 MIL/uL (ref 3.87–5.11)
RDW: 13.2 % (ref 11.5–15.5)
WBC: 8.3 10*3/uL (ref 4.0–10.5)

## 2013-10-20 LAB — PROTIME-INR
INR: 0.96 (ref 0.00–1.49)
Prothrombin Time: 12.6 seconds (ref 11.6–15.2)

## 2013-10-20 LAB — BASIC METABOLIC PANEL
BUN: 9 mg/dL (ref 6–23)
CALCIUM: 9.5 mg/dL (ref 8.4–10.5)
CO2: 25 meq/L (ref 19–32)
CREATININE: 0.7 mg/dL (ref 0.50–1.10)
Chloride: 100 mEq/L (ref 96–112)
GFR calc Af Amer: >90 mL/min (ref >90)
GFR calc non Af Amer: >90 mL/min (ref >90)
GLUCOSE: 118 mg/dL — AB (ref 70–99)
Potassium: 3 mEq/L — ABNORMAL LOW (ref 3.7–5.3)
Sodium: 144 mEq/L (ref 137–147)

## 2013-10-20 MED ORDER — TETANUS-DIPHTH-ACELL PERTUSSIS 5-2.5-18.5 LF-MCG/0.5 IM SUSP
0.5000 mL | Freq: Once | INTRAMUSCULAR | Status: AC
  Administered 2013-10-20: 0.5 mL via INTRAMUSCULAR
  Filled 2013-10-20: qty 0.5

## 2013-10-20 MED ORDER — KETOROLAC TROMETHAMINE 30 MG/ML IJ SOLN
30.0000 mg | Freq: Once | INTRAMUSCULAR | Status: AC
Start: 2013-10-20 — End: 2013-10-20
  Administered 2013-10-20: 30 mg via INTRAVENOUS

## 2013-10-20 MED ORDER — ONDANSETRON 8 MG PO TBDP
8.0000 mg | ORAL_TABLET | Freq: Once | ORAL | Status: AC
  Administered 2013-10-20: 8 mg via ORAL
  Filled 2013-10-20: qty 1

## 2013-10-20 MED ORDER — HYDROMORPHONE HCL 2 MG PO TABS: 2.0000 mg | ORAL_TABLET | Freq: Four times a day (QID) | ORAL | Status: DC | PRN

## 2013-10-20 MED ORDER — KETOROLAC TROMETHAMINE 60 MG/2ML IM SOLN: 60.0000 mg | Freq: Once | INTRAMUSCULAR | Status: DC

## 2013-10-20 MED ORDER — AMOXICILLIN-POT CLAVULANATE 875-125 MG PO TABS: 1.0000 | ORAL_TABLET | Freq: Two times a day (BID) | ORAL | Status: DC

## 2013-10-20 MED ORDER — LIDOCAINE-EPINEPHRINE-TETRACAINE (LET) SOLUTION
NASAL | Status: AC
  Filled 2013-10-20: qty 3

## 2013-10-20 MED ORDER — KETOROLAC TROMETHAMINE 30 MG/ML IJ SOLN
INTRAMUSCULAR | Status: AC
  Administered 2013-10-20: 30 mg via INTRAVENOUS
  Filled 2013-10-20: qty 1

## 2013-10-20 MED ORDER — AMOXICILLIN-POT CLAVULANATE 875-125 MG PO TABS
1.0000 | ORAL_TABLET | Freq: Once | ORAL | Status: AC
  Administered 2013-10-20: 1 via ORAL
  Filled 2013-10-20: qty 1

## 2013-10-20 MED ORDER — LIDOCAINE-EPINEPHRINE-TETRACAINE (LET) SOLUTION
3.0000 mL | Freq: Once | NASAL | Status: AC
  Administered 2013-10-20: 3 mL via TOPICAL

## 2013-10-20 MED ORDER — ONDANSETRON HCL 8 MG PO TABS: 8.0000 mg | ORAL_TABLET | Freq: Once | ORAL | Status: DC

## 2013-10-20 NOTE — Discharge Instructions (Signed)

## 2013-10-20 NOTE — ED Notes (Signed)
MD at bedside. 

## 2013-10-20 NOTE — ED Provider Notes (Signed)
CSN: [id number]     Arrival date & time 10/19/13  2352 History   First MD Initiated Contact with Patient 10/20/13 0014     Chief Complaint  Patient presents with  . Animal Bite     (Consider location/radiation/quality/duration/timing/severity/associated sxs/prior Treatment) Patient is a 58 y.o. female presenting with animal bite. The history is provided by the patient.  Animal Bite Contact animal:  Cat Location:  Leg Leg injury location:  L leg Pain details:    Quality:  Aching   Severity:  Severe   Timing:  Constant Incident location:  Home Provoked: cutting matted fur    Notifications:  None Animal's rabies vaccination status:  Up to date Animal in possession: yes   Tetanus status:  Out of date Relieved by:  Nothing Exacerbated by: shaving leg in shower. Ineffective treatments: originally compressive dressing ineffective, now controlled. Associated symptoms: no fever     Past Medical History  Diagnosis Date  . Back pain, chronic   . Cecal bascule   . Osteoarthritis   . Cancer     skin  . Anxiety    Past Surgical History  Procedure Laterality Date  . Wrist surgery  2009    right wrist  . Elbow surgery      left elbow  . Ovarian cyst removal      right  . Skin cancer excision      multiple-SCC and BCC  . Uterine ablation    . Tubal ligation    . Cholecystectomy    . Colon surgery  02/20/2010    Right hemicolectomy-cecal bascule  . Joint replacement  2006    Left total knee replacement  . Breast surgery  August 2011    Implants removed  . Ablation saphenous vein w/ rfa     Family History  Problem Relation Age of Onset  . Cancer Father     stomach cancer  . Cancer Brother     pancreatic cancer  . Stroke Maternal Grandmother   . Cancer Maternal Grandfather     lung cancer  . Cancer Paternal Grandmother     ovarian  . Heart disease Paternal Grandfather    History  Substance Use Topics  . Smoking status: Never Smoker   . Smokeless tobacco: Not  on file  . Alcohol Use: No   OB History   Grav Para Term Preterm Abortions TAB SAB Ect Mult Living                 Review of Systems  Constitutional: Negative for fever.  Skin: Positive for wound.  All other systems reviewed and are negative.     Allergies  Sulfa antibiotics  Home Medications   Prior to Admission medications   Medication Sig Start Date End Date Taking? Authorizing Provider  albuterol (PROVENTIL HFA;VENTOLIN HFA) 108 (90 BASE) MCG/ACT inhaler Inhale 2 puffs into the lungs every 6 (six) hours as needed for wheezing or shortness of breath. 07/23/13   Wardell Honour, MD  amoxicillin-clavulanate (AUGMENTIN) 875-125 MG per tablet Take 1 tablet by mouth 2 (two) times daily. One po bid x 10 days 10/20/13   Ada Holness K Leyland Kenna-Rasch, MD  cloNIDine (CATAPRES) 0.3 MG tablet take 1 tablet by mouth three times a day 07/23/13   Wardell Honour, MD  HYDROmorphone (DILAUDID) 2 MG tablet Take 1 tablet (2 mg total) by mouth every 6 (six) hours as needed for severe pain. 10/20/13   Curby Carswell Alfonso Patten, MD  IRON-B12-VITAMINS PO Take  by mouth.    Historical Provider, MD  LORazepam (ATIVAN) 1 MG tablet Take 1 tablet (1 mg total) by mouth every 6 (six) hours as needed for anxiety. 07/23/13   Wardell Honour, MD  mometasone (NASONEX) 50 MCG/ACT nasal spray Place 1 spray into the nose daily as needed. Seasonal allergies 07/23/13   Wardell Honour, MD  ondansetron (ZOFRAN-ODT) 8 MG disintegrating tablet Take 1 tablet (8 mg total) by mouth every 8 (eight) hours as needed. 07/23/13   Wardell Honour, MD  potassium chloride (K-DUR,KLOR-CON) 10 MEQ tablet Take 1 tablet (10 mEq total) by mouth 2 (two) times daily. 07/23/13   Wardell Honour, MD  sertraline (ZOLOFT) 100 MG tablet Take 1.5 tablets (150 mg total) by mouth daily. 07/23/13   Wardell Honour, MD  Vitamin D, Ergocalciferol, (DRISDOL) 50000 UNITS CAPS capsule Take 1 capsule (50,000 Units total) by mouth every 7 (seven) days. 07/23/13   Wardell Honour, MD     BP 99/64  Pulse 68  Temp(Src) 97.9 F (36.6 C) (Oral)  Resp 20  SpO2 98% Physical Exam  Constitutional: She is oriented to person, place, and time. She appears well-developed and well-nourished.  HENT:  Head: Normocephalic and atraumatic.  Mouth/Throat: Oropharynx is clear and moist.  Eyes: Conjunctivae and EOM are normal. Pupils are equal, round, and reactive to light.  Neck: Normal range of motion. Neck supple.  Cardiovascular: Normal rate, regular rhythm and intact distal pulses.   Pulmonary/Chest: Effort normal and breath sounds normal. No respiratory distress. She has no wheezes. She has no rales.  Abdominal: Soft. Bowel sounds are normal. There is no tenderness. There is no rebound and no guarding.  Musculoskeletal: Normal range of motion.       Legs: Neurological: She is alert and oriented to person, place, and time.  Skin: Skin is warm and dry. She is not diaphoretic.  Psychiatric: Thought content normal.    ED Course  Procedures (including critical care time) Labs Review Labs Reviewed  BASIC METABOLIC PANEL - Abnormal; Notable for the following:    Potassium 3.0 (*)    Glucose, Bld 118 (*)    All other components within normal limits  CBC WITH DIFFERENTIAL  PROTIME-INR    Imaging Review Dg Tibia/fibula Left  10/20/2013   CLINICAL DATA:  Cat bite to leg.  EXAM: LEFT TIBIA AND FIBULA - 2 VIEW  COMPARISON:  Left ankle radiograph November 06, 2009.  FINDINGS: There is no evidence of fracture or other focal bone lesions. Status post medial compartment arthroplasty, hardware appears intact and well located without periprosthetic lucency. Mild patellofemoral compartment osteoarthrosis. Soft tissues are unremarkable.  IMPRESSION: No acute fracture deformity, dislocation nor subcutaneous gas/ radiopaque foreign bodies.   Electronically Signed   By: Elon Alas   On: 10/20/2013 01:20     EKG Interpretation None      MDM   Final diagnoses:  Cat bite   Medications   ondansetron (ZOFRAN-ODT) disintegrating tablet 8 mg (8 mg Oral Given 10/20/13 0030)  amoxicillin-clavulanate (AUGMENTIN) 875-125 MG per tablet 1 tablet (1 tablet Oral Given 10/20/13 0053)  Tdap (BOOSTRIX) injection 0.5 mL (0.5 mLs Intramuscular Given 10/20/13 0057)  ketorolac (TORADOL) 30 MG/ML injection 30 mg (30 mg Intravenous Given 10/20/13 0055)  lidocaine-EPINEPHrine-tetracaine (LET) solution (3 mLs Topical Given 10/20/13 0134)  No further bleeding since arrival.     Patient gave permission to discuss exam and information with her husband.    Patient resting in the room  with lights off.  Wound irrigated and cleansed after LET applied.  Patient given RX for 10 days of Augmentin along with RX for dilaudid as patient is in considerable pain still but has no rise home.  Bacitracin applied and wound dressed.  Continue neosporin TID.  Recheck in 2 days by your doctor due to the risk of infection of the knee hardware.      Carlisle Beers, MD 10/20/13 [phone]

## 2013-10-21 ENCOUNTER — Encounter (HOSPITAL_COMMUNITY): Payer: Self-pay | Admitting: Emergency Medicine

## 2013-10-21 ENCOUNTER — Emergency Department (HOSPITAL_COMMUNITY): Payer: BC Managed Care – PPO

## 2013-10-21 ENCOUNTER — Inpatient Hospital Stay (HOSPITAL_COMMUNITY)
Admission: EM | Admit: 2013-10-21 | Discharge: 2013-10-23 | DRG: 603 | Disposition: A | Discharge status: Home / Self Care | Payer: BC Managed Care – PPO | Attending: Internal Medicine | Admitting: Internal Medicine

## 2013-10-21 DIAGNOSIS — M009 Pyogenic arthritis, unspecified: Secondary | ICD-10-CM | POA: Diagnosis present

## 2013-10-21 DIAGNOSIS — M659 Unspecified synovitis and tenosynovitis, unspecified site: Secondary | ICD-10-CM | POA: Diagnosis present

## 2013-10-21 DIAGNOSIS — W5501XA Bitten by cat, initial encounter: Secondary | ICD-10-CM | POA: Diagnosis present

## 2013-10-21 DIAGNOSIS — Z85828 Personal history of other malignant neoplasm of skin: Secondary | ICD-10-CM

## 2013-10-21 DIAGNOSIS — M1710 Unilateral primary osteoarthritis, unspecified knee: Secondary | ICD-10-CM | POA: Diagnosis present

## 2013-10-21 DIAGNOSIS — F3289 Other specified depressive episodes: Secondary | ICD-10-CM | POA: Diagnosis present

## 2013-10-21 DIAGNOSIS — IMO0001 Reserved for inherently not codable concepts without codable children: Secondary | ICD-10-CM | POA: Diagnosis present

## 2013-10-21 DIAGNOSIS — C4490 Unspecified malignant neoplasm of skin, unspecified: Secondary | ICD-10-CM | POA: Diagnosis present

## 2013-10-21 DIAGNOSIS — F329 Major depressive disorder, single episode, unspecified: Secondary | ICD-10-CM | POA: Diagnosis present

## 2013-10-21 DIAGNOSIS — F411 Generalized anxiety disorder: Secondary | ICD-10-CM | POA: Diagnosis present

## 2013-10-21 DIAGNOSIS — Z96659 Presence of unspecified artificial knee joint: Secondary | ICD-10-CM

## 2013-10-21 DIAGNOSIS — Z882 Allergy status to sulfonamides status: Secondary | ICD-10-CM

## 2013-10-21 DIAGNOSIS — L03119 Cellulitis of unspecified part of limb: Principal | ICD-10-CM

## 2013-10-21 DIAGNOSIS — S81852A Open bite, left lower leg, initial encounter: Secondary | ICD-10-CM | POA: Diagnosis present

## 2013-10-21 DIAGNOSIS — S81009A Unspecified open wound, unspecified knee, initial encounter: Secondary | ICD-10-CM | POA: Diagnosis present

## 2013-10-21 DIAGNOSIS — F419 Anxiety disorder, unspecified: Secondary | ICD-10-CM | POA: Diagnosis present

## 2013-10-21 DIAGNOSIS — S81809A Unspecified open wound, unspecified lower leg, initial encounter: Secondary | ICD-10-CM

## 2013-10-21 DIAGNOSIS — S91009A Unspecified open wound, unspecified ankle, initial encounter: Secondary | ICD-10-CM

## 2013-10-21 DIAGNOSIS — L0291 Cutaneous abscess, unspecified: Secondary | ICD-10-CM

## 2013-10-21 DIAGNOSIS — L0390 Cellulitis, unspecified: Secondary | ICD-10-CM | POA: Diagnosis present

## 2013-10-21 DIAGNOSIS — E876 Hypokalemia: Secondary | ICD-10-CM | POA: Diagnosis present

## 2013-10-21 DIAGNOSIS — L02419 Cutaneous abscess of limb, unspecified: Principal | ICD-10-CM | POA: Diagnosis present

## 2013-10-21 DIAGNOSIS — R509 Fever, unspecified: Secondary | ICD-10-CM

## 2013-10-21 DIAGNOSIS — Z79899 Other long term (current) drug therapy: Secondary | ICD-10-CM

## 2013-10-21 LAB — CBC WITH DIFFERENTIAL/PLATELET
BASOS PCT: 0 % (ref 0–1)
Basophils Absolute: 0 10*3/uL (ref 0.0–0.1)
Eosinophils Absolute: 0.2 10*3/uL (ref 0.0–0.7)
Eosinophils Relative: 1 % (ref 0–5)
HEMATOCRIT: 38.9 % (ref 36.0–46.0)
HEMOGLOBIN: 13.3 g/dL (ref 12.0–15.0)
Lymphocytes Relative: 20 % (ref 12–46)
Lymphs Abs: 3.3 10*3/uL (ref 0.7–4.0)
MCH: 32 pg (ref 26.0–34.0)
MCHC: 34.2 g/dL (ref 30.0–36.0)
MCV: 93.7 fL (ref 78.0–100.0)
MONO ABS: 1.2 10*3/uL — AB (ref 0.1–1.0)
Monocytes Relative: 8 % (ref 3–12)
Neutro Abs: 11.5 10*3/uL — ABNORMAL HIGH (ref 1.7–7.7)
Neutrophils Relative %: 71 % (ref 43–77)
Platelets: 272 10*3/uL (ref 150–400)
RBC: 4.15 MIL/uL (ref 3.87–5.11)
RDW: 13.3 % (ref 11.5–15.5)
WBC: 16.2 10*3/uL — ABNORMAL HIGH (ref 4.0–10.5)

## 2013-10-21 LAB — I-STAT CHEM 8, ED
BUN: 9 mg/dL (ref 6–23)
CALCIUM ION: 1.07 mmol/L — AB (ref 1.12–1.23)
Chloride: 95 mEq/L — ABNORMAL LOW (ref 96–112)
Creatinine, Ser: 0.8 mg/dL (ref 0.50–1.10)
Glucose, Bld: 111 mg/dL — ABNORMAL HIGH (ref 70–99)
HEMATOCRIT: 43 % (ref 36.0–46.0)
Hemoglobin: 14.6 g/dL (ref 12.0–15.0)
Potassium: 2.7 mEq/L — CL (ref 3.7–5.3)
Sodium: 135 mEq/L — ABNORMAL LOW (ref 137–147)
TCO2: 25 mmol/L (ref 0–100)

## 2013-10-21 LAB — I-STAT CG4 LACTIC ACID, ED: Lactic Acid, Venous: 1.35 mmol/L (ref 0.5–2.2)

## 2013-10-21 LAB — MRSA PCR SCREENING: MRSA by PCR: NEGATIVE

## 2013-10-21 LAB — MAGNESIUM: Magnesium: 1.6 mg/dL (ref 1.5–2.5)

## 2013-10-21 MED ORDER — POTASSIUM CHLORIDE CRYS ER 20 MEQ PO TBCR
40.0000 meq | EXTENDED_RELEASE_TABLET | Freq: Once | ORAL | Status: AC
  Administered 2013-10-21: 40 meq via ORAL
  Filled 2013-10-21: qty 2

## 2013-10-21 MED ORDER — SODIUM CHLORIDE 0.9 % IV SOLN
INTRAVENOUS | Status: DC
  Administered 2013-10-21 (×2): via INTRAVENOUS

## 2013-10-21 MED ORDER — FLUTICASONE PROPIONATE 50 MCG/ACT NA SUSP
1.0000 | Freq: Every day | NASAL | Status: DC
Start: 2013-10-21 — End: 2013-10-23
  Administered 2013-10-21 – 2013-10-22 (×2): 1 via NASAL
  Filled 2013-10-21: qty 16

## 2013-10-21 MED ORDER — VANCOMYCIN HCL IN DEXTROSE 1-5 GM/200ML-% IV SOLN
1000.0000 mg | Freq: Once | INTRAVENOUS | Status: AC
  Administered 2013-10-21: 1000 mg via INTRAVENOUS
  Filled 2013-10-21: qty 200

## 2013-10-21 MED ORDER — ONDANSETRON HCL 4 MG/2ML IJ SOLN
4.0000 mg | Freq: Once | INTRAMUSCULAR | Status: AC
  Administered 2013-10-21: 4 mg via INTRAVENOUS
  Filled 2013-10-21: qty 2

## 2013-10-21 MED ORDER — PIPERACILLIN-TAZOBACTAM 3.375 G IVPB
3.3750 g | Freq: Three times a day (TID) | INTRAVENOUS | Status: DC
  Administered 2013-10-21 – 2013-10-23 (×7): 3.375 g via INTRAVENOUS
  Filled 2013-10-21 (×9): qty 50

## 2013-10-21 MED ORDER — ONDANSETRON HCL 4 MG PO TABS: 4.0000 mg | ORAL_TABLET | Freq: Four times a day (QID) | ORAL | Status: DC | PRN

## 2013-10-21 MED ORDER — ENOXAPARIN SODIUM 40 MG/0.4ML ~~LOC~~ SOLN
40.0000 mg | SUBCUTANEOUS | Status: DC
  Administered 2013-10-21 – 2013-10-23 (×3): 40 mg via SUBCUTANEOUS
  Filled 2013-10-21 (×3): qty 0.4

## 2013-10-21 MED ORDER — SODIUM CHLORIDE 0.9 % IJ SOLN: 3.0000 mL | Freq: Two times a day (BID) | INTRAMUSCULAR | Status: DC

## 2013-10-21 MED ORDER — ACETAMINOPHEN 650 MG RE SUPP: 650.0000 mg | Freq: Four times a day (QID) | RECTAL | Status: DC | PRN

## 2013-10-21 MED ORDER — LORAZEPAM 1 MG PO TABS
1.0000 mg | ORAL_TABLET | Freq: Four times a day (QID) | ORAL | Status: DC | PRN
  Administered 2013-10-21 – 2013-10-23 (×8): 1 mg via ORAL
  Filled 2013-10-21 (×8): qty 1

## 2013-10-21 MED ORDER — POTASSIUM CHLORIDE 10 MEQ/100ML IV SOLN
10.0000 meq | Freq: Once | INTRAVENOUS | Status: AC
  Administered 2013-10-21: 10 meq via INTRAVENOUS
  Filled 2013-10-21: qty 100

## 2013-10-21 MED ORDER — CLONIDINE HCL 0.2 MG PO TABS
0.2000 mg | ORAL_TABLET | Freq: Two times a day (BID) | ORAL | Status: DC
  Administered 2013-10-21 – 2013-10-23 (×5): 0.2 mg via ORAL
  Filled 2013-10-21 (×6): qty 1

## 2013-10-21 MED ORDER — DIPHENHYDRAMINE HCL 25 MG PO CAPS
25.0000 mg | ORAL_CAPSULE | Freq: Four times a day (QID) | ORAL | Status: DC | PRN
  Administered 2013-10-21: 25 mg via ORAL
  Filled 2013-10-21 (×2): qty 1

## 2013-10-21 MED ORDER — VITAMIN D (ERGOCALCIFEROL) 1.25 MG (50000 UNIT) PO CAPS
50000.0000 [IU] | ORAL_CAPSULE | ORAL | Status: DC
  Administered 2013-10-21: 50000 [IU] via ORAL
  Filled 2013-10-21: qty 1

## 2013-10-21 MED ORDER — HYDROMORPHONE HCL PF 2 MG/ML IJ SOLN
2.0000 mg | INTRAMUSCULAR | Status: DC | PRN
  Administered 2013-10-21 – 2013-10-23 (×10): 2 mg via INTRAVENOUS
  Filled 2013-10-21 (×10): qty 1

## 2013-10-21 MED ORDER — ONDANSETRON HCL 4 MG/2ML IJ SOLN
4.0000 mg | Freq: Four times a day (QID) | INTRAMUSCULAR | Status: DC | PRN
  Administered 2013-10-21 – 2013-10-23 (×5): 4 mg via INTRAVENOUS
  Filled 2013-10-21 (×7): qty 2

## 2013-10-21 MED ORDER — VANCOMYCIN HCL IN DEXTROSE 750-5 MG/150ML-% IV SOLN
750.0000 mg | Freq: Two times a day (BID) | INTRAVENOUS | Status: DC
  Administered 2013-10-21 – 2013-10-23 (×5): 750 mg via INTRAVENOUS
  Filled 2013-10-21 (×5): qty 150

## 2013-10-21 MED ORDER — POLYETHYLENE GLYCOL 3350 17 G PO PACK
17.0000 g | PACK | Freq: Every day | ORAL | Status: DC
  Administered 2013-10-21 – 2013-10-22 (×2): 17 g via ORAL
  Filled 2013-10-21 (×3): qty 1

## 2013-10-21 MED ORDER — PIPERACILLIN-TAZOBACTAM 3.375 G IVPB: 3.3750 g | Freq: Once | INTRAVENOUS | Status: DC

## 2013-10-21 MED ORDER — AMOXICILLIN-POT CLAVULANATE 875-125 MG PO TABS
1.0000 | ORAL_TABLET | Freq: Two times a day (BID) | ORAL | Status: DC
  Filled 2013-10-21 (×2): qty 1

## 2013-10-21 MED ORDER — SERTRALINE HCL 100 MG PO TABS
100.0000 mg | ORAL_TABLET | Freq: Every day | ORAL | Status: DC
  Administered 2013-10-21 – 2013-10-23 (×3): 100 mg via ORAL
  Filled 2013-10-21 (×3): qty 1

## 2013-10-21 MED ORDER — SODIUM CHLORIDE 0.9 % IV BOLUS (SEPSIS)
500.0000 mL | Freq: Once | INTRAVENOUS | Status: AC
  Administered 2013-10-21: 500 mL via INTRAVENOUS

## 2013-10-21 MED ORDER — PIPERACILLIN-TAZOBACTAM 3.375 G IVPB 30 MIN
3.3750 g | Freq: Once | INTRAVENOUS | Status: AC
  Administered 2013-10-21: 3.375 g via INTRAVENOUS
  Filled 2013-10-21: qty 50

## 2013-10-21 MED ORDER — POTASSIUM CHLORIDE CRYS ER 20 MEQ PO TBCR
20.0000 meq | EXTENDED_RELEASE_TABLET | Freq: Two times a day (BID) | ORAL | Status: DC
  Administered 2013-10-21 (×2): 20 meq via ORAL
  Filled 2013-10-21 (×4): qty 1

## 2013-10-21 MED ORDER — HYDROCODONE-ACETAMINOPHEN 5-325 MG PO TABS
1.0000 | ORAL_TABLET | ORAL | Status: DC | PRN
  Administered 2013-10-22: 1 via ORAL
  Filled 2013-10-21: qty 1

## 2013-10-21 MED ORDER — ACETAMINOPHEN 325 MG PO TABS: 650.0000 mg | ORAL_TABLET | Freq: Four times a day (QID) | ORAL | Status: DC | PRN

## 2013-10-21 MED ORDER — HYDROMORPHONE HCL PF 2 MG/ML IJ SOLN
2.0000 mg | Freq: Once | INTRAMUSCULAR | Status: AC
  Administered 2013-10-21: 2 mg via INTRAVENOUS
  Filled 2013-10-21: qty 1

## 2013-10-21 NOTE — ED Notes (Signed)
PA at bedside.

## 2013-10-21 NOTE — ED Notes (Signed)
Pt returned from XRAY 

## 2013-10-21 NOTE — ED Notes (Signed)
Patient states she awakened suddenly with tight pain just above her knee and radiating into her lower leg and fever. Patient was seen at [facility] last night for a place where skin cancer had been removed that started bleeding.

## 2013-10-21 NOTE — ED Notes (Signed)
Pt from home c/o left knee pain that woke her from her sleep around 11pm this past evening. She took 2mg  Dilaudid at 11:30 pm. HX of partial left knee replacement. Pt has multiple sites on both legs and arm where skin cancer was removed. These sites are covered with bandages. Good  dorasal pedal and popliteal pulses on LLE. No pitting edema or swelling noted.

## 2013-10-21 NOTE — ED Provider Notes (Addendum)
Medical screening examination/treatment/procedure(s) were performed by non-physician practitioner and as supervising physician I was immediately available for consultation/collaboration.   Date: 10/21/2013  Rate: 80  Rhythm: normal sinus rhythm  QRS Axis: normal  Intervals: normal  ST/T Wave abnormalities: normal  Conduction Disutrbances: none  Narrative Interpretation: unremarkable     658 Case d/w Dr. Alvan Dame of orthopedics who will see the patient in consult April K Palumbo-Rasch, MD 10/21/13 0612  April K Palumbo-Rasch, MD 10/21/13 0700

## 2013-10-21 NOTE — Progress Notes (Addendum)
[facility] PROGRESS NOTE  Marketta Valadez BPZ:[id number] DOB: 01-13-1956 DOA: 10/21/2013 PCP: Jenny Reichmann, MD  Assessment/Plan: 1. Left lower extremity cellulitis -Patient initially presented to the emergency room on 10/20/2013 after sustaining cat bite -Her wound was irrigated and cleansed and discharged on Augmentin -Presented with worsening pain and swelling -Will continue broad-spectrum IV antimicrobial therapy with vancomycin and Zosyn  2.  Left knee pain -Patient developing significant pain to her left knee occurring yesterday -This morning she reports improvement with flexion and extension however continues to have pain. She has a history of left knee arthroplasty -Left knee x-ray showing no evidence of fracture or dislocation, arthroplasty is grossly unremarkable in appearance without evidence of loosening, stable small knee joint effusion noted -Orthopedic surgery has been consulted -Continue broad-spectrum IV antimicrobial therapy for now  3.  Hypokalemia -Initial labs showing a low potassium of 2.7 -She was given a 20 mEq of IV potassium chloride followed by oral replacement with Kdur -Repeat a.m. labs  Code Status: Full Code Family Communication:  Disposition Plan: Ortho Consult   Consultants:  Orthopedic Surgery  Antibiotics: Zosyn Vancomycin   HPI/Subjective: Patient is a pleasant 58 year old female with a past medical history of osteoarthritis, chronic back pain, initially presented to the emergency room on 10/20/2013 after sustaining a cat bite. She was discharged from the emergency room in stable condition given a prescription for Augmentin. She came back with complaints of severe left knee pain. Initial x-ray did not reveal evidence of fracture or dislocation as arthroplasty was grossly unremarkable in appearance. Patient started on empiric IV antimicrobial therapy with vancomycin and Zosyn  Objective: Filed Vitals:   10/21/13 0701  BP: 111/63   Pulse: 76  Temp: 99.4 F (37.4 C)  Resp: 16    Intake/Output Summary (Last 24 hours) at 10/21/13 2423 Last data filed at 10/21/13 0825  Gross per 24 hour  Intake 113.33 ml  Output    300 ml  Net -186.67 ml   Filed Weights   10/21/13 0404 10/21/13 0701  Weight: 63.504 kg (140 lb) 69.627 kg (153 lb 8 oz)    Exam:   General:  No acute distress awake, alert, oriented  Cardiovascular: Regular rate and rhythm normal S1-S2  Respiratory: Clear to auscultation bilaterally  Abdomen: Soft nontender nondistended  Musculoskeletal: There is mild left knee swelling, I did not note erythema or localized warmth over the left knee, she was able to flex and extend her left knee although patient had received narcotic analgesics recently for pain.   Data Reviewed: Basic Metabolic Panel:  Recent Labs Lab 10/20/13 0055 10/21/13 0444 10/21/13 0548  NA 144  --  135*  K 3.0*  --  2.7*  CL 100  --  95*  CO2 25  --   --   GLUCOSE 118*  --  111*  BUN 9  --  9  CREATININE 0.70  --  0.80  CALCIUM 9.5  --   --   MG  --  1.6  --    Liver Function Tests: No results found for this basename: AST, ALT, ALKPHOS, BILITOT, PROT, ALBUMIN,  in the last 168 hours No results found for this basename: LIPASE, AMYLASE,  in the last 168 hours No results found for this basename: AMMONIA,  in the last 168 hours CBC:  Recent Labs Lab 10/20/13 0055 10/21/13 0442 10/21/13 0548  WBC 8.3 16.2*  --   NEUTROABS 4.1 11.5*  --   HGB 14.6 13.3 14.6  HCT  41.6 38.9 43.0  MCV 95.0 93.7  --   PLT 288 272  --    Cardiac Enzymes: No results found for this basename: CKTOTAL, CKMB, CKMBINDEX, TROPONINI,  in the last 168 hours BNP (last 3 results) No results found for this basename: PROBNP,  in the last 8760 hours CBG: No results found for this basename: GLUCAP,  in the last 168 hours  No results found for this or any previous visit (from the past 240 hour(s)).   Studies: Dg Tibia/fibula Left  10/20/2013    CLINICAL DATA:  Cat bite to leg.  EXAM: LEFT TIBIA AND FIBULA - 2 VIEW  COMPARISON:  Left ankle radiograph November 06, 2009.  FINDINGS: There is no evidence of fracture or other focal bone lesions. Status post medial compartment arthroplasty, hardware appears intact and well located without periprosthetic lucency. Mild patellofemoral compartment osteoarthrosis. Soft tissues are unremarkable.  IMPRESSION: No acute fracture deformity, dislocation nor subcutaneous gas/ radiopaque foreign bodies.   Electronically Signed   By: Elon Alas   On: 10/20/2013 01:20   Dg Knee Complete 4 Views Left  10/21/2013   CLINICAL DATA:  Left knee pain and fever.  EXAM: LEFT KNEE - COMPLETE 4+ VIEW  COMPARISON:  Left knee radiographs performed 10/12/2007  FINDINGS: The patient's left medial knee arthroplasty is grossly unremarkable in appearance, without evidence of loosening or fracture. Minimal chondrocalcinosis is noted. A small knee joint effusion is noted, grossly stable from the prior study. No new degenerative change is seen. No significant soft tissue abnormalities are otherwise characterized.  IMPRESSION: 1. No evidence of fracture or dislocation. 2. Left medial knee arthroplasty is grossly unremarkable in appearance, without evidence of loosening. 3. Minimal chondrocalcinosis noted. 4. Stable small knee joint effusion noted.   Electronically Signed   By: Garald Balding M.D.   On: 10/21/2013 05:35    Scheduled Meds: . cloNIDine  0.2 mg Oral BID  . enoxaparin (LOVENOX) injection  40 mg Subcutaneous Q24H  . fluticasone  1 spray Each Nare Daily  . piperacillin-tazobactam (ZOSYN)  IV  3.375 g Intravenous 3 times per day  . polyethylene glycol  17 g Oral Daily  . potassium chloride  10 mEq Intravenous Once  . potassium chloride  20 mEq Oral BID  . sertraline  100 mg Oral Daily  . sodium chloride  3 mL Intravenous Q12H  . vancomycin  750 mg Intravenous Q12H  . Vitamin D (Ergocalciferol)  50,000 Units Oral Q7 days    Continuous Infusions: . sodium chloride 100 mL/hr at 10/21/13 6712    Principal Problem:   Cellulitis Active Problems:   Anxiety disorder   Skin cancer   Chronic hypokalemia   Cat bite of left lower leg   Septic arthritis of knee, left    Time spent: 35  min    Kelvin Cellar  [facility] Pager [phone]. If 7PM-7AM, please contact night-coverage at [email or web address], password [facility] 10/21/2013, 8:33 AM  LOS: 0 days

## 2013-10-21 NOTE — Progress Notes (Signed)
ANTIBIOTIC CONSULT NOTE - INITIAL  Pharmacy Consult for Vancomycin Indication: Cellulitis  Allergies  Allergen Reactions  . Sulfa Antibiotics Itching and Swelling    Throat, body swelling    Patient Measurements: Height: 5' 7.5" (171.5 cm) Weight: 153 lb 8 oz (69.627 kg) IBW/kg (Calculated) : 62.75  Vital Signs: Temp: 99.4 F (37.4 C) (06/16 0701) Temp src: Oral (06/16 0701) BP: 111/63 mmHg (06/16 0701) Pulse Rate: 76 (06/16 0701) Intake/Output from previous day:   Intake/Output from this shift:    Labs:  Recent Labs  10/20/13 0055 10/21/13 0442 10/21/13 0548  WBC 8.3 16.2*  --   HGB 14.6 13.3 14.6  PLT 288 272  --   CREATININE 0.70  --  0.80   Estimated Creatinine Clearance: 76.9 ml/min (by C-G formula based on Cr of 0.8). No results found for this basename: VANCOTROUGH, VANCOPEAK, VANCORANDOM, GENTTROUGH, GENTPEAK, GENTRANDOM, TOBRATROUGH, TOBRAPEAK, TOBRARND, AMIKACINPEAK, AMIKACINTROU, AMIKACIN,  in the last 72 hours   Microbiology: No results found for this or any previous visit (from the past 720 hour(s)).  Medical History: Past Medical History  Diagnosis Date  . Back pain, chronic   . Cecal bascule   . Osteoarthritis   . Cancer     skin  . Anxiety     Assessment: 43 yoF presents with fever, chills, and left knee pain. Pt seen previous day for cat bite on left calf and prescribed augmentin.  PMHx: anxiety, skin cancers, OA of left knee s/p arthroplasty.  Pt started on zosyn and vancomycin and ortho consulted for possible septic joint.  Antibiotic allergy: Sulfa (throat, body itching and swelling)  PTA augmentin x 3 doses 6/16 >> Vancomycin  >> 6/16 >>  Zosyn  >>    Tmax: 100 WBCs: Elevated 16.2K Renal:SCr 0.80, CrCl 77 ml/min (N88)  6/16 blood: Collected 6/16 MRSA PCR: collected  Goal of Therapy:  Vancomycin trough level 15-20 mcg/ml  Plan:  Vancomycin 750mg  IV q12h Zosyn dose ok F/u renal function, cultures, Tm, WBC, clinical  course  Ralene Bathe, PharmD, BCPS 10/21/2013, 7:36 AM  Pager: [phone]

## 2013-10-21 NOTE — Care Management Note (Addendum)
    Page 1 of 1   10/23/2013     12:43:10 PM CARE MANAGEMENT NOTE 10/23/2013  Patient:  Beth Frey, Beth Frey   Account Number:  [id number]  Date Initiated:  10/21/2013  Documentation initiated by:  Beth Frey  Subjective/Objective Assessment:   58 Y/O F ADMITTED W/L LEG CELLULITIS.     Action/Plan:   FROM HOME.   Anticipated DC Date:  10/23/2013   Anticipated DC Plan:  [place]  CM consult      Choice offered to / List presented to:             Status of service:  Completed, signed off Medicare Important Message given?   (If response is "NO", the following Medicare IM given date fields will be blank) Date Medicare IM given:   Date Additional Medicare IM given:    Discharge Disposition:  HOME/SELF CARE  Per UR Regulation:  Reviewed for med. necessity/level of care/duration of stay  If discussed at Long Length of Stay Meetings, dates discussed:    Comments:  10/21/13 Beth Adamcik RN,BSN NCM [phone] NO ANTICIPATED D/C NEEDS.

## 2013-10-21 NOTE — H&P (Signed)
[facility] History and Physical  Kaityln Kallstrom TIR:[id number] DOB: Oct 12, 1955 DOA: 10/21/2013  Referring physician: Dr Loni Beckwith PCP: Jenny Reichmann, MD   Chief Complaint:  Fever and chills since one day with left knee pain  HPI:  58 year old female with history of severe anxiety, skin cancers and osteoarthritis of left knee status post arthroplasty presented to the ED with acute onset of fever with chills and left knee pain since yesterday evening. Patient was seen in the ED yesterday morning after present the cat bite with a puncture wound in the left calf and a scratch below it. The wound was irrigated and patient discharged home on Augmentin. As the ED physician, somebody told the patient that her cat might be taken away so patient on presentation today denies the incident of cat bite and says the lesions are are from recent skin biopsies. Patient denies headache, dizziness,  nausea , vomiting, chest pain, palpitations, SOB, abdominal pain, bowel or urinary symptoms. Denies change in weight or appetite.  Course in the ED pt had a temperature of 100 degree Fahrenheit. Blood pressure was a little normal at 91/55 mmHg pediatrist 38 and O2 sat were normal. Blood work showed increased leukocytosis of 16.2 k( was 8.3K yesterday). Hemoglobin and platelets were normal. Chemistry showed sodium of 135, potassium 2.7 and chloride of 95. Renal function was normal. X-ray of the knee done showed normal left medial knee arthroplasty with small left knee joint effusion. EKG was unremarkable.  Patient given a dose of IV vancomycin and Zosyn and try hospice consulted for admission to medical floor. Orthopedic surgery was consulted by ED physician.  Review of Systems:  Constitutional: Denies fever, chills, diaphoresis, appetite change and fatigue.  HEENT: Denies photophobia, eye pain, redness, hearing loss, ear pain, congestion, sore throat, rhinorrhea, sneezing,  trouble swallowing, neck pain,  Respiratory:  Denies SOB, DOE, cough, chest tightness,  and wheezing.   Cardiovascular: Denies chest pain, palpitations and leg swelling.  Gastrointestinal: Denies nausea, vomiting, abdominal pain, diarrhea, constipation, blood in stool and abdominal distention.  Genitourinary: Denies dysuria, urgency, frequency, hematuria, flank pain and difficulty urinating.  Endocrine: Denies: hot or cold intolerance,  polyuria, polydipsia. Musculoskeletal: Pain over her left knee with swelling and difficulty walking, Denies myalgias, back pain,  Skin: Denies pallor, rash and wound.  Neurological: Denies dizziness, seizures, syncope, weakness, light-headedness, numbness and headaches.  Hematological: Denies adenopathy.  Psychiatric/Behavioral: Denies confusion, nervousness, sleep disturbance and agitation   Past Medical History  Diagnosis Date  . Back pain, chronic   . Cecal bascule   . Osteoarthritis   . Cancer     skin  . Anxiety    Past Surgical History  Procedure Laterality Date  . Wrist surgery  2009    right wrist  . Elbow surgery      left elbow  . Ovarian cyst removal      right  . Skin cancer excision      multiple-SCC and BCC  . Uterine ablation    . Tubal ligation    . Cholecystectomy    . Colon surgery  02/20/2010    Right hemicolectomy-cecal bascule  . Joint replacement  2006    Left total knee replacement  . Breast surgery  August 2011    Implants removed  . Ablation saphenous vein w/ rfa     Social History:  reports that she has never smoked. She does not have any smokeless tobacco history on file. She reports that she does not drink alcohol  or use illicit drugs.  Allergies  Allergen Reactions  . Sulfa Antibiotics Itching and Swelling    Throat, body swelling    Family History  Problem Relation Age of Onset  . Cancer Father     stomach cancer  . Cancer Brother     pancreatic cancer  . Stroke Maternal Grandmother   . Cancer Maternal Grandfather     lung cancer  . Cancer  Paternal Grandmother     ovarian  . Heart disease Paternal Grandfather     Prior to Admission medications   Medication Sig Start Date End Date Taking? Authorizing Provider  amoxicillin-clavulanate (AUGMENTIN) 875-125 MG per tablet Take 1 tablet by mouth 2 (two) times daily. One po bid x 10 days 10/20/13  Yes April K Palumbo-Rasch, MD  cloNIDine (CATAPRES) 0.3 MG tablet Take 0.3 mg by mouth 3 (three) times daily. take 1 tablet by mouth three times a day 07/23/13  Yes Wardell Honour, MD  HYDROmorphone (DILAUDID) 2 MG tablet Take 1 tablet (2 mg total) by mouth every 6 (six) hours as needed for severe pain. 10/20/13  Yes April K Palumbo-Rasch, MD  LORazepam (ATIVAN) 1 MG tablet Take 1 tablet (1 mg total) by mouth every 6 (six) hours as needed for anxiety. 07/23/13  Yes Wardell Honour, MD  mometasone (NASONEX) 50 MCG/ACT nasal spray Place 2 sprays into the nose daily as needed (allergies).    Yes Historical Provider, MD  ondansetron (ZOFRAN-ODT) 8 MG disintegrating tablet Take 8 mg by mouth every 8 (eight) hours as needed for nausea or vomiting.   Yes Historical Provider, MD  polyethylene glycol (MIRALAX / GLYCOLAX) packet Take 17 g by mouth daily.   Yes Historical Provider, MD  potassium chloride (K-DUR,KLOR-CON) 10 MEQ tablet Take 1 tablet (10 mEq total) by mouth 2 (two) times daily. 07/23/13  Yes Wardell Honour, MD  sertraline (ZOLOFT) 100 MG tablet Take 100 mg by mouth daily.   Yes Historical Provider, MD  Vitamin D, Ergocalciferol, (DRISDOL) 50000 UNITS CAPS capsule Take 1 capsule (50,000 Units total) by mouth every 7 (seven) days. 07/23/13  Yes Wardell Honour, MD     Physical Exam:  Filed Vitals:   10/21/13 0404 10/21/13 0634  BP: 111/75 91/55  Pulse: 94 78  Temp: 100 F (37.8 C)   TempSrc: Oral   Resp: 20 16  Height: 5\' 7"  (1.702 m)   Weight: 63.504 kg (140 lb)   SpO2: 100% 97%    Constitutional: Vital signs reviewed.  Thin built middle aged female in no acute distress. Appears quite  anxious HEENT: no pallor, no icterus, moist oral mucosa,  Cardiovascular: RRR, S1 normal, S2 normal, no MRG Chest: CTAB, no wheezes, rales, or rhonchi Abdominal: Soft. Non-tender, non-distended, bowel sounds are normal,  Ext: warm, multiple Band-Aids applied over left calf with 1 clean puncture wound . mild swelling of left knee with tenderness to palpation medially and limited flexion  Neurological: A&O x3, non focal  Labs on Admission:  Basic Metabolic Panel:  Recent Labs Lab 10/20/13 0055 10/21/13 0548  NA 144 135*  K 3.0* 2.7*  CL 100 95*  CO2 25  --   GLUCOSE 118* 111*  BUN 9 9  CREATININE 0.70 0.80  CALCIUM 9.5  --    Liver Function Tests: No results found for this basename: AST, ALT, ALKPHOS, BILITOT, PROT, ALBUMIN,  in the last 168 hours No results found for this basename: LIPASE, AMYLASE,  in the last 168 hours  No results found for this basename: AMMONIA,  in the last 168 hours CBC:  Recent Labs Lab 10/20/13 0055 10/21/13 0442 10/21/13 0548  WBC 8.3 16.2*  --   NEUTROABS 4.1 11.5*  --   HGB 14.6 13.3 14.6  HCT 41.6 38.9 43.0  MCV 95.0 93.7  --   PLT 288 272  --    Cardiac Enzymes: No results found for this basename: CKTOTAL, CKMB, CKMBINDEX, TROPONINI,  in the last 168 hours BNP: No components found with this basename: POCBNP,  CBG: No results found for this basename: GLUCAP,  in the last 168 hours  Radiological Exams on Admission: Dg Tibia/fibula Left  10/20/2013   CLINICAL DATA:  Cat bite to leg.  EXAM: LEFT TIBIA AND FIBULA - 2 VIEW  COMPARISON:  Left ankle radiograph November 06, 2009.  FINDINGS: There is no evidence of fracture or other focal bone lesions. Status post medial compartment arthroplasty, hardware appears intact and well located without periprosthetic lucency. Mild patellofemoral compartment osteoarthrosis. Soft tissues are unremarkable.  IMPRESSION: No acute fracture deformity, dislocation nor subcutaneous gas/ radiopaque foreign bodies.    Electronically Signed   By: Elon Alas   On: 10/20/2013 01:20   Dg Knee Complete 4 Views Left  10/21/2013   CLINICAL DATA:  Left knee pain and fever.  EXAM: LEFT KNEE - COMPLETE 4+ VIEW  COMPARISON:  Left knee radiographs performed 10/12/2007  FINDINGS: The patient's left medial knee arthroplasty is grossly unremarkable in appearance, without evidence of loosening or fracture. Minimal chondrocalcinosis is noted. A small knee joint effusion is noted, grossly stable from the prior study. No new degenerative change is seen. No significant soft tissue abnormalities are otherwise characterized.  IMPRESSION: 1. No evidence of fracture or dislocation. 2. Left medial knee arthroplasty is grossly unremarkable in appearance, without evidence of loosening. 3. Minimal chondrocalcinosis noted. 4. Stable small knee joint effusion noted.   Electronically Signed   By: Garald Balding M.D.   On: 10/21/2013 05:35    EKG: Normal sinus rhythm at 89, no ST-T changes  Assessment/Plan Principal Problem:   Cellulitis of left lower leg Secondary to cat bite for which she was seen in the ED yesterday morning. She had one puncture wound and another scratch over the left calf which was irrigated and discharged home on Augmentin. I will continue he'll memantine.  Active Problems: ? Septic arthritis of left knee Given the acute onset of left knee pain and mild swelling with limited flexion and tenderness on exam, septic arthritis needs to be ruled out. Patient also has acute onset of low-grade fever and leukocytosis. She has a left knee arthroplasty. She has been started on empiric vancomycin and Zosyn which I will continue. Dr. Alvan Dame has been consulted by ED physician and are waiting call back. -place her on when necessary Vicodin for pain and add IV Dilaudid for severe pain. -PT eval    Anxiety disorder   Continue home dose when necessary Ativan    Chronic hypokalemia Replenish  KCl. Check magnesium level. Monitor  on telemetry  Depression Continue Zoloft      Diet: Regular  DVT prophylaxis: sq lovenox   Code Status: full code Family Communication: discussed with husband over the phone Disposition Plan: home once stable  DHUNGEL, [facility] Pager [phone]  Total time spent on admission :70 minutes  If 7PM-7AM, please contact night-coverage [email or web address] Password [email or web address] 10/21/2013, 6:50 AM

## 2013-10-21 NOTE — ED Provider Notes (Signed)
CSN: [id number]     Arrival date & time 10/21/13  0353 History   First MD Initiated Contact with Patient 10/21/13 0435     Chief Complaint  Patient presents with  . Fever  . Leg Pain    left   HPI  History provided by the patient. Patient is a 58 year old female with previous history of skin cancers, left knee replacement and chronic back pain who presents with symptoms of fever, chills and increased pain to the left knee. Patient reports that she was having some bleeding to the left lower leg after skin cancer biopsies recently. She was seen in the emergency department yesterday and states she was given a prescription for an antibiotic. She has been taking this as a total of 3 doses but early this morning woke up feeling very hot sweaty with chills and having significant worsening pain to the left knee. She did use her home pain medication of 2 mg Dilaudid around midnight but this did not help significantly and she is continued to feel poorly. She denies any nausea or vomiting. No weakness or numbness in the foot. No other aggravating or alleviating factors. No other associated symptoms.    Past Medical History  Diagnosis Date  . Back pain, chronic   . Cecal bascule   . Osteoarthritis   . Cancer     skin  . Anxiety    Past Surgical History  Procedure Laterality Date  . Wrist surgery  2009    right wrist  . Elbow surgery      left elbow  . Ovarian cyst removal      right  . Skin cancer excision      multiple-SCC and BCC  . Uterine ablation    . Tubal ligation    . Cholecystectomy    . Colon surgery  02/20/2010    Right hemicolectomy-cecal bascule  . Joint replacement  2006    Left total knee replacement  . Breast surgery  August 2011    Implants removed  . Ablation saphenous vein w/ rfa     Family History  Problem Relation Age of Onset  . Cancer Father     stomach cancer  . Cancer Brother     pancreatic cancer  . Stroke Maternal Grandmother   . Cancer Maternal  Grandfather     lung cancer  . Cancer Paternal Grandmother     ovarian  . Heart disease Paternal Grandfather    History  Substance Use Topics  . Smoking status: Never Smoker   . Smokeless tobacco: Not on file  . Alcohol Use: No   OB History   Grav Para Term Preterm Abortions TAB SAB Ect Mult Living                 Review of Systems  Constitutional: Positive for fever, chills and diaphoresis.  HENT: Negative for sore throat.   Respiratory: Negative for cough.   Gastrointestinal: Negative for vomiting and diarrhea.  All other systems reviewed and are negative.     Allergies  Sulfa antibiotics  Home Medications   Prior to Admission medications   Medication Sig Start Date End Date Taking? Authorizing Provider  albuterol (PROVENTIL HFA;VENTOLIN HFA) 108 (90 BASE) MCG/ACT inhaler Inhale 2 puffs into the lungs every 6 (six) hours as needed for wheezing or shortness of breath. 07/23/13   Wardell Honour, MD  amoxicillin-clavulanate (AUGMENTIN) 875-125 MG per tablet Take 1 tablet by mouth 2 (two) times daily. One po  bid x 10 days 10/20/13   April K Palumbo-Rasch, MD  cloNIDine (CATAPRES) 0.3 MG tablet take 1 tablet by mouth three times a day 07/23/13   Wardell Honour, MD  HYDROmorphone (DILAUDID) 2 MG tablet Take 1 tablet (2 mg total) by mouth every 6 (six) hours as needed for severe pain. 10/20/13   April Alfonso Patten, MD  IRON-B12-VITAMINS PO Take by mouth.    Historical Provider, MD  LORazepam (ATIVAN) 1 MG tablet Take 1 tablet (1 mg total) by mouth every 6 (six) hours as needed for anxiety. 07/23/13   Wardell Honour, MD  mometasone (NASONEX) 50 MCG/ACT nasal spray Place 1 spray into the nose daily as needed. Seasonal allergies 07/23/13   Wardell Honour, MD  ondansetron (ZOFRAN-ODT) 8 MG disintegrating tablet Take 1 tablet (8 mg total) by mouth every 8 (eight) hours as needed. 07/23/13   Wardell Honour, MD  potassium chloride (K-DUR,KLOR-CON) 10 MEQ tablet Take 1 tablet (10 mEq total)  by mouth 2 (two) times daily. 07/23/13   Wardell Honour, MD  sertraline (ZOLOFT) 100 MG tablet Take 1.5 tablets (150 mg total) by mouth daily. 07/23/13   Wardell Honour, MD  Vitamin D, Ergocalciferol, (DRISDOL) 50000 UNITS CAPS capsule Take 1 capsule (50,000 Units total) by mouth every 7 (seven) days. 07/23/13   Wardell Honour, MD   BP 111/75  Pulse 94  Temp(Src) 100 F (37.8 C) (Oral)  Resp 20  Ht 5\' 7"  (1.702 m)  Wt 140 lb (63.504 kg)  BMI 21.92 kg/m2  SpO2 100% Physical Exam  Nursing note and vitals reviewed. Constitutional: She is oriented to person, place, and time. She appears well-developed and well-nourished. No distress.  HENT:  Head: Normocephalic.  Cardiovascular: Normal rate and regular rhythm.   Pulmonary/Chest: Effort normal and breath sounds normal. No respiratory distress. She has no wheezes. She has no rales.  Musculoskeletal:  Old surgical scar over the anterior left knee consistent with previous surgery. There are 4 small wounds to the lateral left lower leg with Band-Aids. There is mild erythema without bleeding or drainage. Mild swelling of the upper calf area and diffuse swelling around the knee with tenderness. There is pain and reduced range of motion. The knee is very hot to touch. Normal distal dorsal pedal pulses in the foot and sensations.  Neurological: She is alert and oriented to person, place, and time.  Skin: Skin is warm and dry.  Psychiatric: She has a normal mood and affect. Her behavior is normal.    ED Course  Procedures   COORDINATION OF CARE:  Nursing notes reviewed. Vital signs reviewed. Initial pt interview and examination performed.   Filed Vitals:   10/21/13 0404  BP: 111/75  Pulse: 94  Temp: 100 F (37.8 C)  TempSrc: Oral  Resp: 20  Height: 5\' 7"  (1.702 m)  Weight: 140 lb (63.504 kg)  SpO2: 100%    4:59 AM-patient seen and evaluated. Patient with low-grade fever. Does have history of left knee replacement. Recent trauma to the  skin of the left lower extremity. Now with swelling pain and hot joint. She is able to flex and move the joint some does have increased pain. I do have concern for possible early septic joint.  Elevated WBC almost double from yesterday in last 24 hours. Additional lab tests included blood cultures ordered. We'll plan to give IV antibiotics.  Spoke with Dr. Clementeen Graham with [facility].  He will see pt and admit.  Would like ortho consult for possible septic joint.  Will place on tele bed.   Treatment plan initiated: Medications  sodium chloride 0.9 % bolus 500 mL (not administered)  HYDROmorphone (DILAUDID) injection 2 mg (not administered)   Results for orders placed during the hospital encounter of 10/21/13  CBC WITH DIFFERENTIAL      Result Value Ref Range   WBC 16.2 (*) 4.0 - 10.5 K/uL   RBC 4.15  3.87 - 5.11 MIL/uL   Hemoglobin 13.3  12.0 - 15.0 g/dL   HCT 38.9  36.0 - 46.0 %   MCV 93.7  78.0 - 100.0 fL   MCH 32.0  26.0 - 34.0 pg   MCHC 34.2  30.0 - 36.0 g/dL   RDW 13.3  11.5 - 15.5 %   Platelets 272  150 - 400 K/uL   Neutrophils Relative % 71  43 - 77 %   Neutro Abs 11.5 (*) 1.7 - 7.7 K/uL   Lymphocytes Relative 20  12 - 46 %   Lymphs Abs 3.3  0.7 - 4.0 K/uL   Monocytes Relative 8  3 - 12 %   Monocytes Absolute 1.2 (*) 0.1 - 1.0 K/uL   Eosinophils Relative 1  0 - 5 %   Eosinophils Absolute 0.2  0.0 - 0.7 K/uL   Basophils Relative 0  0 - 1 %   Basophils Absolute 0.0  0.0 - 0.1 K/uL  I-STAT CHEM 8, ED      Result Value Ref Range   Sodium 135 (*) 137 - 147 mEq/L   Potassium 2.7 (*) 3.7 - 5.3 mEq/L   Chloride 95 (*) 96 - 112 mEq/L   BUN 9  6 - 23 mg/dL   Creatinine, Ser 0.80  0.50 - 1.10 mg/dL   Glucose, Bld 111 (*) 70 - 99 mg/dL   Calcium, Ion 1.07 (*) 1.12 - 1.23 mmol/L   TCO2 25  0 - 100 mmol/L   Hemoglobin 14.6  12.0 - 15.0 g/dL   HCT 43.0  36.0 - 46.0 %   Comment NOTIFIED PHYSICIAN    I-STAT CG4 LACTIC ACID, ED      Result Value Ref Range   Lactic Acid,  Venous 1.35  0.5 - 2.2 mmol/L       Imaging Review Dg Tibia/fibula Left  10/20/2013   CLINICAL DATA:  Cat bite to leg.  EXAM: LEFT TIBIA AND FIBULA - 2 VIEW  COMPARISON:  Left ankle radiograph November 06, 2009.  FINDINGS: There is no evidence of fracture or other focal bone lesions. Status post medial compartment arthroplasty, hardware appears intact and well located without periprosthetic lucency. Mild patellofemoral compartment osteoarthrosis. Soft tissues are unremarkable.  IMPRESSION: No acute fracture deformity, dislocation nor subcutaneous gas/ radiopaque foreign bodies.   Electronically Signed   By: Elon Alas   On: 10/20/2013 01:20    MDM   Final diagnoses:  Cellulitis  Fever  Hypokalemia        Martie Lee, PA-C 10/21/13 [phone]

## 2013-10-21 NOTE — ED Notes (Signed)
Hospitalist at bedside 

## 2013-10-21 NOTE — ED Notes (Signed)
Pt transported XRAY °

## 2013-10-22 DIAGNOSIS — E876 Hypokalemia: Secondary | ICD-10-CM

## 2013-10-22 DIAGNOSIS — R509 Fever, unspecified: Secondary | ICD-10-CM

## 2013-10-22 DIAGNOSIS — IMO0001 Reserved for inherently not codable concepts without codable children: Secondary | ICD-10-CM

## 2013-10-22 DIAGNOSIS — S91009A Unspecified open wound, unspecified ankle, initial encounter: Secondary | ICD-10-CM

## 2013-10-22 DIAGNOSIS — S81809A Unspecified open wound, unspecified lower leg, initial encounter: Secondary | ICD-10-CM

## 2013-10-22 DIAGNOSIS — S81009A Unspecified open wound, unspecified knee, initial encounter: Secondary | ICD-10-CM

## 2013-10-22 LAB — BASIC METABOLIC PANEL
BUN: 8 mg/dL (ref 6–23)
CALCIUM: 9.1 mg/dL (ref 8.4–10.5)
CO2: 26 mEq/L (ref 19–32)
CREATININE: 0.75 mg/dL (ref 0.50–1.10)
Chloride: 102 mEq/L (ref 96–112)
GLUCOSE: 125 mg/dL — AB (ref 70–99)
POTASSIUM: 3 meq/L — AB (ref 3.7–5.3)
Sodium: 140 mEq/L (ref 137–147)

## 2013-10-22 LAB — CBC
HCT: 38.4 % (ref 36.0–46.0)
Hemoglobin: 12.7 g/dL (ref 12.0–15.0)
MCH: 31.8 pg (ref 26.0–34.0)
MCHC: 33.1 g/dL (ref 30.0–36.0)
MCV: 96.2 fL (ref 78.0–100.0)
Platelets: 253 K/uL (ref 150–400)
RBC: 3.99 MIL/uL (ref 3.87–5.11)
RDW: 13.3 % (ref 11.5–15.5)
WBC: 8.8 K/uL (ref 4.0–10.5)

## 2013-10-22 LAB — SYNOVIAL CELL COUNT + DIFF, W/ CRYSTALS
CRYSTALS FLUID: NONE SEEN
Lymphocytes-Synovial Fld: 7 % (ref 0–20)
MONOCYTE-MACROPHAGE-SYNOVIAL FLUID: 14 % — AB (ref 50–90)
Neutrophil, Synovial: 79 % — ABNORMAL HIGH (ref 0–25)
WBC, SYNOVIAL: 4778 /mm3 — AB (ref 0–200)

## 2013-10-22 LAB — GRAM STAIN: Special Requests: NORMAL

## 2013-10-22 MED ORDER — DIPHENHYDRAMINE HCL 25 MG PO CAPS
25.0000 mg | ORAL_CAPSULE | ORAL | Status: DC | PRN
  Administered 2013-10-22 – 2013-10-23 (×4): 25 mg via ORAL
  Filled 2013-10-22 (×4): qty 1

## 2013-10-22 MED ORDER — POTASSIUM CHLORIDE CRYS ER 20 MEQ PO TBCR
40.0000 meq | EXTENDED_RELEASE_TABLET | Freq: Once | ORAL | Status: AC
  Administered 2013-10-22: 40 meq via ORAL
  Filled 2013-10-22: qty 2

## 2013-10-22 MED ORDER — POTASSIUM CHLORIDE CRYS ER 20 MEQ PO TBCR
40.0000 meq | EXTENDED_RELEASE_TABLET | Freq: Four times a day (QID) | ORAL | Status: AC
  Administered 2013-10-22 (×2): 40 meq via ORAL
  Filled 2013-10-22 (×2): qty 2

## 2013-10-22 MED ORDER — LIDOCAINE HCL 1 % IJ SOLN
INTRAMUSCULAR | Status: AC
  Filled 2013-10-22: qty 20

## 2013-10-22 NOTE — Progress Notes (Signed)
[facility] PROGRESS NOTE  Beth Frey OZD:[id number] DOB: 01-13-1956 DOA: 10/21/2013 PCP: Jenny Reichmann, MD  Assessment/Plan: 1. Left lower extremity cellulitis -Patient initially presented to the emergency room on 10/20/2013 after sustaining cat bite -Her wound was irrigated and cleansed and discharged on Augmentin -Presented with worsening pain and swelling -Clinically improving, with white count coming down to 8,800 from 16,200.  -Will continue broad-spectrum IV antimicrobial therapy with vancomycin and Zosyn, anticipate narrowing her antimicrobial spectrum tomorrow.   2.  Left knee pain -Reports knee pain is improved today, is able to flex and extend knee. She has a history of left knee arthroplasty -Left knee x-ray showing no evidence of fracture or dislocation, arthroplasty is grossly unremarkable in appearance without evidence of loosening, stable small knee joint effusion noted -Orthopedic surgery consulted, plan for arthrocentesis today  3.  Hypokalemia -Initial labs showing a low potassium of 2.7, improved to 3.0, will provide additional potassium today  -Repeat a.m. labs  Code Status: Full Code Family Communication: Spoke with her husband at bedside Disposition Plan: Ortho Consult   Consultants:  Orthopedic Surgery  Antibiotics: Zosyn Vancomycin   HPI/Subjective: Patient is a pleasant 58 year old female with a past medical history of osteoarthritis, chronic back pain, initially presented to the emergency room on 10/20/2013 after sustaining a cat bite. She was discharged from the emergency room in stable condition given a prescription for Augmentin. She came back with complaints of severe left knee pain. Initial x-ray did not reveal evidence of fracture or dislocation as arthroplasty was grossly unremarkable in appearance. Patient started on empiric IV antimicrobial therapy with vancomycin and Zosyn  Objective: Filed Vitals:   10/22/13 0441  BP: 125/71   Pulse: 83  Temp: 98.3 F (36.8 C)  Resp: 16    Intake/Output Summary (Last 24 hours) at 10/22/13 2595 Last data filed at 10/22/13 0442  Gross per 24 hour  Intake 3538.33 ml  Output   4000 ml  Net -461.67 ml   Filed Weights   10/21/13 0404 10/21/13 0701  Weight: 63.504 kg (140 lb) 69.627 kg (153 lb 8 oz)    Exam:   General:  No acute distress awake, alert, oriented  Cardiovascular: Regular rate and rhythm normal S1-S2  Respiratory: Clear to auscultation bilaterally  Abdomen: Soft nontender nondistended  Musculoskeletal: There is mild left knee swelling, I did not note erythema or localized warmth over the left knee, she was able to flex and extend her left knee.   Data Reviewed: Basic Metabolic Panel:  Recent Labs Lab 10/20/13 0055 10/21/13 0444 10/21/13 0548 10/22/13 0323  NA 144  --  135* 140  K 3.0*  --  2.7* 3.0*  CL 100  --  95* 102  CO2 25  --   --  26  GLUCOSE 118*  --  111* 125*  BUN 9  --  9 8  CREATININE 0.70  --  0.80 0.75  CALCIUM 9.5  --   --  9.1  MG  --  1.6  --   --    Liver Function Tests: No results found for this basename: AST, ALT, ALKPHOS, BILITOT, PROT, ALBUMIN,  in the last 168 hours No results found for this basename: LIPASE, AMYLASE,  in the last 168 hours No results found for this basename: AMMONIA,  in the last 168 hours CBC:  Recent Labs Lab 10/20/13 0055 10/21/13 0442 10/21/13 0548 10/22/13 0323  WBC 8.3 16.2*  --  8.8  NEUTROABS 4.1 11.5*  --   --  HGB 14.6 13.3 14.6 12.7  HCT 41.6 38.9 43.0 38.4  MCV 95.0 93.7  --  96.2  PLT 288 272  --  253   Cardiac Enzymes: No results found for this basename: CKTOTAL, CKMB, CKMBINDEX, TROPONINI,  in the last 168 hours BNP (last 3 results) No results found for this basename: PROBNP,  in the last 8760 hours CBG: No results found for this basename: GLUCAP,  in the last 168 hours  Recent Results (from the past 240 hour(s))  CULTURE, BLOOD (ROUTINE X 2)     Status: None    Collection Time    10/21/13  5:14 AM      Result Value Ref Range Status   Specimen Description BLOOD RIGHT ARM   Final   Special Requests BOTTLES DRAWN AEROBIC AND ANAEROBIC 5CC EACH   Final   Culture  Setup Time     Final   Value: 10/21/2013 08:27     Performed at [facility]   Culture     Final   Value:        BLOOD CULTURE RECEIVED NO GROWTH TO DATE CULTURE WILL BE HELD FOR 5 DAYS BEFORE ISSUING A FINAL NEGATIVE REPORT     Performed at [facility]   Report Status PENDING   Incomplete  CULTURE, BLOOD (ROUTINE X 2)     Status: None   Collection Time    10/21/13  5:14 AM      Result Value Ref Range Status   Specimen Description BLOOD LEFT ARM   Final   Special Requests BOTTLES DRAWN AEROBIC AND ANAEROBIC 5CC EACH   Final   Culture  Setup Time     Final   Value: 10/21/2013 08:27     Performed at [facility]   Culture     Final   Value:        BLOOD CULTURE RECEIVED NO GROWTH TO DATE CULTURE WILL BE HELD FOR 5 DAYS BEFORE ISSUING A FINAL NEGATIVE REPORT     Performed at [facility]   Report Status PENDING   Incomplete  MRSA PCR SCREENING     Status: None   Collection Time    10/21/13  7:08 AM      Result Value Ref Range Status   MRSA by PCR NEGATIVE  NEGATIVE Final   Comment:            The GeneXpert MRSA Assay (FDA     approved for NASAL specimens     only), is one component of a     comprehensive MRSA colonization     surveillance program. It is not     intended to diagnose MRSA     infection nor to guide or     monitor treatment for     MRSA infections.     Studies: Dg Knee Complete 4 Views Left  10/21/2013   CLINICAL DATA:  Left knee pain and fever.  EXAM: LEFT KNEE - COMPLETE 4+ VIEW  COMPARISON:  Left knee radiographs performed 10/12/2007  FINDINGS: The patient's left medial knee arthroplasty is grossly unremarkable in appearance, without evidence of loosening or fracture. Minimal chondrocalcinosis is noted. A small knee joint  effusion is noted, grossly stable from the prior study. No new degenerative change is seen. No significant soft tissue abnormalities are otherwise characterized.  IMPRESSION: 1. No evidence of fracture or dislocation. 2. Left medial knee arthroplasty is grossly unremarkable in appearance, without evidence of loosening. 3. Minimal chondrocalcinosis  noted. 4. Stable small knee joint effusion noted.   Electronically Signed   By: Garald Balding M.D.   On: 10/21/2013 05:35    Scheduled Meds: . cloNIDine  0.2 mg Oral BID  . enoxaparin (LOVENOX) injection  40 mg Subcutaneous Q24H  . fluticasone  1 spray Each Nare Daily  . lidocaine      . piperacillin-tazobactam (ZOSYN)  IV  3.375 g Intravenous 3 times per day  . polyethylene glycol  17 g Oral Daily  . potassium chloride  40 mEq Oral Q6H  . sertraline  100 mg Oral Daily  . sodium chloride  3 mL Intravenous Q12H  . vancomycin  750 mg Intravenous Q12H  . Vitamin D (Ergocalciferol)  50,000 Units Oral Q7 days   Continuous Infusions:    Principal Problem:   Cellulitis Active Problems:   Anxiety disorder   Skin cancer   Chronic hypokalemia   Cat bite of left lower leg   Septic arthritis of knee, left    Time spent: 25  min    Kelvin Cellar  [facility] Pager [phone]. If 7PM-7AM, please contact night-coverage at [email or web address], password [facility] 10/22/2013, 9:37 AM  LOS: 1 day

## 2013-10-23 LAB — BASIC METABOLIC PANEL
BUN: 7 mg/dL (ref 6–23)
CHLORIDE: 101 meq/L (ref 96–112)
CO2: 25 mEq/L (ref 19–32)
CREATININE: 0.87 mg/dL (ref 0.50–1.10)
Calcium: 9.7 mg/dL (ref 8.4–10.5)
GFR calc Af Amer: 84 mL/min — ABNORMAL LOW (ref >90)
GFR, EST NON AFRICAN AMERICAN: 73 mL/min — AB (ref >90)
Glucose, Bld: 118 mg/dL — ABNORMAL HIGH (ref 70–99)
Potassium: 3.4 mEq/L — ABNORMAL LOW (ref 3.7–5.3)
Sodium: 139 mEq/L (ref 137–147)

## 2013-10-23 LAB — CBC
HEMATOCRIT: 36.1 % (ref 36.0–46.0)
Hemoglobin: 11.7 g/dL — ABNORMAL LOW (ref 12.0–15.0)
MCH: 31.4 pg (ref 26.0–34.0)
MCHC: 32.4 g/dL (ref 30.0–36.0)
MCV: 96.8 fL (ref 78.0–100.0)
Platelets: 250 10*3/uL (ref 150–400)
RBC: 3.73 MIL/uL — ABNORMAL LOW (ref 3.87–5.11)
RDW: 13.2 % (ref 11.5–15.5)
WBC: 8 10*3/uL (ref 4.0–10.5)

## 2013-10-23 MED ORDER — POTASSIUM CHLORIDE CRYS ER 20 MEQ PO TBCR
40.0000 meq | EXTENDED_RELEASE_TABLET | Freq: Once | ORAL | Status: AC
  Administered 2013-10-23: 40 meq via ORAL
  Filled 2013-10-23: qty 2

## 2013-10-23 MED ORDER — HYDROMORPHONE HCL 2 MG PO TABS: 2.0000 mg | ORAL_TABLET | Freq: Four times a day (QID) | ORAL | Status: DC | PRN

## 2013-10-23 MED ORDER — POTASSIUM CHLORIDE CRYS ER 20 MEQ PO TBCR
40.0000 meq | EXTENDED_RELEASE_TABLET | Freq: Every day | ORAL | Status: DC
Start: 2013-10-23 — End: 2014-03-06

## 2013-10-23 MED ORDER — CLONIDINE HCL 0.2 MG PO TABS: 0.2000 mg | ORAL_TABLET | Freq: Two times a day (BID) | ORAL | Status: DC

## 2013-10-23 MED ORDER — DOXYCYCLINE HYCLATE 50 MG PO CAPS: 100.0000 mg | ORAL_CAPSULE | Freq: Two times a day (BID) | ORAL | Status: AC

## 2013-10-23 NOTE — Progress Notes (Signed)
Patient ID: Beth Frey, female   DOB: 1956-01-18, 58 y.o.   MRN: [id number]  Doing better, knee feeling better  Aspirated Left knee yesterday  About 4800 WBC without significant left shift  Gram stain - no growth thus far    Moving knee much better Swelling done AF WBC - 8.0  Given only partial knee replacement and current workup there is a low likelihood of septic left knee, most likely reactive synovitis from close proximity cellulitis  Would transition to PO antibiotics for at least 2 weeks until cellulitis cleared completely  Given sulfa allergy would do doxycycline 100mg  BID for 3 weeks  She will follow up with me in 2-3 weeks for her knee Reviewed plan with her  Full consult note from initial eval to follow

## 2013-10-23 NOTE — Discharge Summary (Signed)
Physician Discharge Summary  Beth Frey DXA:[id number] DOB: February 23, 1956 DOA: 10/21/2013  PCP: Jenny Reichmann, MD  Admit date: 10/21/2013 Discharge date: 10/23/2013  Time spent: 35 minutes  Recommendations for Outpatient Follow-up:  1. Please follow up on lower extremity cellulitis  2. Follow up on blood pressures, due to low blood pressures her Clonidine dose was reduced to 0.2 mg PO BID 3. Follow up on Potassium levels as she was hypokalemic during this hospitalization  Discharge Diagnoses:  Principal Problem:   Cellulitis Active Problems:   Anxiety disorder   Skin cancer   Chronic hypokalemia   Cat bite of left lower leg   Septic arthritis of knee, left   Discharge Condition: Stable  Diet recommendation: Heart Healthy  Filed Weights   10/21/13 0404 10/21/13 0701  Weight: 63.504 kg (140 lb) 69.627 kg (153 lb 8 oz)    History of present illness:  58 year old female with history of severe anxiety, skin cancers and osteoarthritis of left knee status post arthroplasty presented to the ED with acute onset of fever with chills and left knee pain since yesterday evening. Patient was seen in the ED yesterday morning after present the cat bite with a puncture wound in the left calf and a scratch below it. The wound was irrigated and patient discharged home on Augmentin. As the ED physician, somebody told the patient that her cat might be taken away so patient on presentation today denies the incident of cat bite and says the lesions are are from recent skin biopsies. Patient denies headache, dizziness, nausea , vomiting, chest pain, palpitations, SOB, abdominal pain, bowel or urinary symptoms. Denies change in weight or appetite.   Hospital Course:  Patient is a pleasant 58 year old female with a past medical history of osteoarthritis, chronic back pain, initially presented to the emergency room on 10/20/2013 after sustaining a cat bite. She was discharged from the emergency room in stable  condition given a prescription for Augmentin. She came back with complaints of severe left knee pain. Initial x-ray did not reveal evidence of fracture or dislocation as arthroplasty was grossly unremarkable in appearance. Patient started on empiric IV antimicrobial therapy with vancomycin and Zosyn. Patient was seen by Dr. Alvan Dame of orthopedic surgery during this hospitalization she underwent arthrocentesis. Fluid analysis revealed 4800 wbc's without left shift, cultures show no growth. It was felt that there was low likelihood of septic left knee and likely was secondary to reactive cellulitis from close proximity of cellulitis. Other issues addressed in this hospitalization included hypokalemia, initially presenting with a potassium of 2.7, this was replaced during her hospitalization. On the day of discharge her potassium had improved to 3.4 as she was administered 40 mEq of K. to her prior to discharge. He was discharged home in stable condition on 10/23/2013.    Procedures:  Arthocentesis  Consultations:  Orthopedic Surgery: Dr Alvan Dame  Discharge Exam: Filed Vitals:   10/23/13 0624  BP: 117/74  Pulse:   Temp: 98 F (36.7 C)  Resp: 18    General: No acute distress awake, alert, oriented  Cardiovascular: Regular rate and rhythm normal S1-S2  Respiratory: Clear to auscultation bilaterally  Abdomen: Soft nontender nondistended  Musculoskeletal: Cellulitis of left lower extremity resolving, left knee much improved as she is able to flex and extend knee without significant pain.    Discharge Instructions You were cared for by a hospitalist during your hospital stay. If you have any questions about your discharge medications or the care you received while you  were in the hospital after you are discharged, you can call the unit and asked to speak with the hospitalist on call if the hospitalist that took care of you is not available. Once you are discharged, your primary care physician will  handle any further medical issues. Please note that NO REFILLS for any discharge medications will be authorized once you are discharged, as it is imperative that you return to your primary care physician (or establish a relationship with a primary care physician if you do not have one) for your aftercare needs so that they can reassess your need for medications and monitor your lab values.  Discharge Instructions   Call MD for:  difficulty breathing, headache or visual disturbances    Complete by:  As directed      Call MD for:  extreme fatigue    Complete by:  As directed      Call MD for:  persistant dizziness or light-headedness    Complete by:  As directed      Call MD for:  persistant nausea and vomiting    Complete by:  As directed      Call MD for:  severe uncontrolled pain    Complete by:  As directed      Call MD for:  temperature >100.4    Complete by:  As directed      Diet - low sodium heart healthy    Complete by:  As directed      Increase activity slowly    Complete by:  As directed             Medication List    STOP taking these medications       amoxicillin-clavulanate 875-125 MG per tablet  Commonly known as:  AUGMENTIN      TAKE these medications       cloNIDine 0.2 MG tablet  Commonly known as:  CATAPRES  Take 1 tablet (0.2 mg total) by mouth 2 (two) times daily.     doxycycline 50 MG capsule  Commonly known as:  VIBRAMYCIN  Take 2 capsules (100 mg total) by mouth 2 (two) times daily.     HYDROmorphone 2 MG tablet  Commonly known as:  DILAUDID  Take 1 tablet (2 mg total) by mouth every 6 (six) hours as needed for severe pain.     LORazepam 1 MG tablet  Commonly known as:  ATIVAN  Take 1 tablet (1 mg total) by mouth every 6 (six) hours as needed for anxiety.     mometasone 50 MCG/ACT nasal spray  Commonly known as:  NASONEX  Place 2 sprays into the nose daily as needed (allergies).     ondansetron 8 MG disintegrating tablet  Commonly known as:   ZOFRAN-ODT  Take 8 mg by mouth every 8 (eight) hours as needed for nausea or vomiting.     polyethylene glycol packet  Commonly known as:  MIRALAX / GLYCOLAX  Take 17 g by mouth daily.     potassium chloride SA 20 MEQ tablet  Commonly known as:  K-DUR,KLOR-CON  Take 2 tablets (40 mEq total) by mouth daily.     sertraline 100 MG tablet  Commonly known as:  ZOLOFT  Take 100 mg by mouth daily.     Vitamin D (Ergocalciferol) 50000 UNITS Caps capsule  Commonly known as:  DRISDOL  Take 1 capsule (50,000 Units total) by mouth every 7 (seven) days.       Allergies  Allergen Reactions  .  Sulfa Antibiotics Itching and Swelling    Throat, body swelling       Follow-up Information   Follow up with Mauri Pole, MD In 2 weeks.   Specialty:  Orthopedic Surgery   Contact information:   [address] [place] [address] [phone]       Follow up with DAUB, STEVE A, MD In 1 week.   Specialty:  Family Medicine   Contact information:   [place] [address] [phone]        The results of significant diagnostics from this hospitalization (including imaging, microbiology, ancillary and laboratory) are listed below for reference.    Significant Diagnostic Studies: Dg Tibia/fibula Left  10/20/2013   CLINICAL DATA:  Cat bite to leg.  EXAM: LEFT TIBIA AND FIBULA - 2 VIEW  COMPARISON:  Left ankle radiograph November 06, 2009.  FINDINGS: There is no evidence of fracture or other focal bone lesions. Status post medial compartment arthroplasty, hardware appears intact and well located without periprosthetic lucency. Mild patellofemoral compartment osteoarthrosis. Soft tissues are unremarkable.  IMPRESSION: No acute fracture deformity, dislocation nor subcutaneous gas/ radiopaque foreign bodies.   Electronically Signed   By: Elon Alas   On: 10/20/2013 01:20   Dg Knee Complete 4 Views Left  10/21/2013   CLINICAL DATA:  Left knee pain and fever.   EXAM: LEFT KNEE - COMPLETE 4+ VIEW  COMPARISON:  Left knee radiographs performed 10/12/2007  FINDINGS: The patient's left medial knee arthroplasty is grossly unremarkable in appearance, without evidence of loosening or fracture. Minimal chondrocalcinosis is noted. A small knee joint effusion is noted, grossly stable from the prior study. No new degenerative change is seen. No significant soft tissue abnormalities are otherwise characterized.  IMPRESSION: 1. No evidence of fracture or dislocation. 2. Left medial knee arthroplasty is grossly unremarkable in appearance, without evidence of loosening. 3. Minimal chondrocalcinosis noted. 4. Stable small knee joint effusion noted.   Electronically Signed   By: Garald Balding M.D.   On: 10/21/2013 05:35    Microbiology: Recent Results (from the past 240 hour(s))  CULTURE, BLOOD (ROUTINE X 2)     Status: None   Collection Time    10/21/13  5:14 AM      Result Value Ref Range Status   Specimen Description BLOOD RIGHT ARM   Final   Special Requests BOTTLES DRAWN AEROBIC AND ANAEROBIC 5CC EACH   Final   Culture  Setup Time     Final   Value: 10/21/2013 08:27     Performed at [facility]   Culture     Final   Value:        BLOOD CULTURE RECEIVED NO GROWTH TO DATE CULTURE WILL BE HELD FOR 5 DAYS BEFORE ISSUING A FINAL NEGATIVE REPORT     Performed at [facility]   Report Status PENDING   Incomplete  CULTURE, BLOOD (ROUTINE X 2)     Status: None   Collection Time    10/21/13  5:14 AM      Result Value Ref Range Status   Specimen Description BLOOD LEFT ARM   Final   Special Requests BOTTLES DRAWN AEROBIC AND ANAEROBIC [facility] EACH   Final   Culture  Setup Time     Final   Value: 10/21/2013 08:27     Performed at [facility]   Culture     Final   Value:        BLOOD CULTURE  RECEIVED NO GROWTH TO DATE CULTURE WILL BE HELD FOR 5 DAYS BEFORE ISSUING A FINAL NEGATIVE REPORT     Performed at [facility]   Report Status  PENDING   Incomplete  MRSA PCR SCREENING     Status: None   Collection Time    10/21/13  7:08 AM      Result Value Ref Range Status   MRSA by PCR NEGATIVE  NEGATIVE Final   Comment:            The GeneXpert MRSA Assay (FDA     approved for NASAL specimens     only), is one component of a     comprehensive MRSA colonization     surveillance program. It is not     intended to diagnose MRSA     infection nor to guide or     monitor treatment for     MRSA infections.  GRAM STAIN     Status: None   Collection Time    10/22/13  8:40 AM      Result Value Ref Range Status   Specimen Description SYNOVIAL   Final   Special Requests Normal   Final   Gram Stain     Final   Value: CYTOSPIN     WBC PRESENT, PREDOMINANTLY PMN     NO ORGANISMS SEEN     Gram Stain Report Called to,Read Back By and Verified With: C. MYERS RN AT 8341 ON 06.17.15 BY SHUEA   Report Status 10/22/2013 FINAL   Final  BODY FLUID CULTURE     Status: None   Collection Time    10/22/13  8:40 AM      Result Value Ref Range Status   Specimen Description SYNOVIAL LEFT KNEE   Final   Special Requests Normal   Final   Gram Stain     Final   Value: CYTOSPIN WBC PRESENT, PREDOMINANTLY PMN     NO ORGANISMS SEEN     Gram Stain Report Called to,Read Back By and Verified With: Gram Stain Report Called to,Read Back By and Verified With:  C. MYERS RN AT 9622 ON 06.17.15 BY SHUEA     Performed at [facility]   Culture PENDING   Incomplete   Report Status PENDING   Incomplete     Labs: Basic Metabolic Panel:  Recent Labs Lab 10/20/13 0055 10/21/13 0444 10/21/13 0548 10/22/13 0323 10/23/13 0326  NA 144  --  135* 140 139  K 3.0*  --  2.7* 3.0* 3.4*  CL 100  --  95* 102 101  CO2 25  --   --  26 25  GLUCOSE 118*  --  111* 125* 118*  BUN 9  --  9 8 7   CREATININE 0.70  --  0.80 0.75 0.87  CALCIUM 9.5  --   --  9.1 9.7  MG  --  1.6  --   --   --    Liver Function Tests: No results found for this basename: AST,  ALT, ALKPHOS, BILITOT, PROT, ALBUMIN,  in the last 168 hours No results found for this basename: LIPASE, AMYLASE,  in the last 168 hours No results found for this basename: AMMONIA,  in the last 168 hours CBC:  Recent Labs Lab 10/20/13 0055 10/21/13 0442 10/21/13 0548 10/22/13 0323 10/23/13 0326  WBC 8.3 16.2*  --  8.8 8.0  NEUTROABS 4.1 11.5*  --   --   --   HGB 14.6  13.3 14.6 12.7 11.7*  HCT 41.6 38.9 43.0 38.4 36.1  MCV 95.0 93.7  --  96.2 96.8  PLT 288 272  --  253 250   Cardiac Enzymes: No results found for this basename: CKTOTAL, CKMB, CKMBINDEX, TROPONINI,  in the last 168 hours BNP: BNP (last 3 results) No results found for this basename: PROBNP,  in the last 8760 hours CBG: No results found for this basename: GLUCAP,  in the last 168 hours     Signed:  Kelvin Cellar  [facility] 10/23/2013, 7:47 AM

## 2013-10-25 LAB — BODY FLUID CULTURE
Culture: NO GROWTH
SPECIAL REQUESTS: NORMAL

## 2013-10-27 LAB — CULTURE, BLOOD (ROUTINE X 2)
CULTURE: NO GROWTH
Culture: NO GROWTH

## 2013-11-03 ENCOUNTER — Ambulatory Visit (INDEPENDENT_AMBULATORY_CARE_PROVIDER_SITE_OTHER): Payer: BC Managed Care – PPO | Admitting: Emergency Medicine

## 2013-11-03 VITALS — BP 121/78 | HR 67 | Temp 98.0°F | Resp 18 | Ht 67.5 in | Wt 149.0 lb

## 2013-11-03 DIAGNOSIS — L568 Other specified acute skin changes due to ultraviolet radiation: Secondary | ICD-10-CM

## 2013-11-03 DIAGNOSIS — T50905A Adverse effect of unspecified drugs, medicaments and biological substances, initial encounter: Secondary | ICD-10-CM

## 2013-11-03 MED ORDER — FLUCONAZOLE 150 MG PO TABS: 150.0000 mg | ORAL_TABLET | Freq: Once | ORAL | Status: DC

## 2013-11-03 MED ORDER — HYDROMORPHONE HCL 2 MG PO TABS: 2.0000 mg | ORAL_TABLET | Freq: Four times a day (QID) | ORAL | Status: DC | PRN

## 2013-11-03 NOTE — Patient Instructions (Addendum)
11:15am 11/04/13 with Dr. Rhona Raider. Beth Frey  Drug Toxicity You are having a reaction to your medicine. This does not mean you are allergic to the drug. Medicines can have many different side effects and toxic reactions. These include:  Stomach symptoms, such as nausea, vomiting, cramps, diarrhea, bloating, constipation, and dry mouth.  Nervous system symptoms, such as weakness, muscle spasms, drowsiness, confusion, agitation, and headache.  Heart and blood vessel symptoms, such as fainting, irregular heartbeat (palpitations), and fast heartbeat.  Skin symptoms, such as itching, light sensitivity, and rashes. When taking more medicines, there is an increased chance of a drug interaction that can make you sick. Take your medicines as your caregiver recommends. Keep a list of the names and doses of each of your drugs. Avoid alcohol and street drugs. Call your caregiver if you are worried about drug side effects or toxicity. Document Released: 04/24/2005 Document Revised: 07/17/2011 Document Reviewed: 10/09/2006 [facility] Patient Information 2015 [place], [place]. This information is not intended to replace advice given to you by your health care provider. Make sure you discuss any questions you have with your health care provider.

## 2013-11-03 NOTE — Progress Notes (Signed)
   Subjective:    Patient ID: Beth Frey, female    DOB: August 18, 1955, 58 y.o.   MRN: [id number]  HPI 58 year old female pt here for medication reaction to doxycycline. She has taken doxycycline multiple times and has never had a problem. She was in [place] and was out in the sun a lot last Tuesday. Yesterday she started having chills and severe left knee pain. Went to [facility] and had a white count of 4.7 thousand. Her left knee is swollen. She has blisters on both hands from the reaction to the sun while on doxycycline. She has a rash on her face and extremities. The inside of her mouth is inflamed. No swelling around any other glands. Her dermatologist is Dr. Riley Lam. Her brother passed away in 27-Aug-2022. She struggles dealing with his loss.    Review of Systems     Objective:   Physical Exam there is redness appearing like a burn over the lips and face area. He is are spared. There is also significant redness appearing like a severe first degree burn over the dorsum of both hands. Chest and heart exam are normal there are wounds over the left knee which do not appear infected. There is good range of motion of the knee without effusion.        Assessment & Plan:  Patient given  # 12  2 mg Dilaudid tablets . She can have one every 4-6 hours. I placed a call to Dr. Levell July her dermatologist so he can see her in followup. I also will grow a prescription for Diflucan to help with yeast.

## 2013-11-07 NOTE — Progress Notes (Signed)
Pt emailed [place] a request to write a letter for her so that she can get reimbursed for a trip that she had to cancel due to her latest OV. See letter. Mailed.

## 2014-01-25 ENCOUNTER — Other Ambulatory Visit: Payer: Self-pay | Admitting: Family Medicine

## 2014-01-29 NOTE — Telephone Encounter (Signed)
Please call in refill of Ativan to pharmacy for patient. Then call pt and advise that she is due for follow-up with Dr. Everlene Farrier. Please then transfer her to scheduling to schedule OV with Daub in upcoming month.  Thanks.

## 2014-01-29 NOTE — Telephone Encounter (Signed)
Called in script to pharmacy. Pt advised to RTC within the next month- gave Dr. Everlene Farrier hours next week.

## 2014-01-31 ENCOUNTER — Ambulatory Visit (HOSPITAL_BASED_OUTPATIENT_CLINIC_OR_DEPARTMENT_OTHER)
Admission: RE | Admit: 2014-01-31 | Discharge: 2014-01-31 | Disposition: A | Discharge status: Home / Self Care | Payer: BC Managed Care – PPO | Source: Ambulatory Visit | Attending: Emergency Medicine | Admitting: Emergency Medicine

## 2014-01-31 ENCOUNTER — Other Ambulatory Visit (HOSPITAL_BASED_OUTPATIENT_CLINIC_OR_DEPARTMENT_OTHER): Payer: Self-pay | Admitting: Emergency Medicine

## 2014-01-31 DIAGNOSIS — S8990XA Unspecified injury of unspecified lower leg, initial encounter: Secondary | ICD-10-CM | POA: Insufficient documentation

## 2014-01-31 DIAGNOSIS — R52 Pain, unspecified: Secondary | ICD-10-CM

## 2014-01-31 DIAGNOSIS — S99929A Unspecified injury of unspecified foot, initial encounter: Principal | ICD-10-CM

## 2014-01-31 DIAGNOSIS — M79609 Pain in unspecified limb: Secondary | ICD-10-CM | POA: Insufficient documentation

## 2014-01-31 DIAGNOSIS — S99919A Unspecified injury of unspecified ankle, initial encounter: Secondary | ICD-10-CM | POA: Diagnosis present

## 2014-01-31 DIAGNOSIS — X58XXXA Exposure to other specified factors, initial encounter: Secondary | ICD-10-CM | POA: Insufficient documentation

## 2014-02-01 ENCOUNTER — Other Ambulatory Visit: Payer: Self-pay | Admitting: Family Medicine

## 2014-03-03 ENCOUNTER — Ambulatory Visit: Payer: BC Managed Care – PPO | Admitting: Emergency Medicine

## 2014-03-03 ENCOUNTER — Encounter: Payer: Self-pay | Admitting: Family Medicine

## 2014-03-03 ENCOUNTER — Ambulatory Visit: Payer: BC Managed Care – PPO | Admitting: Family Medicine

## 2014-03-03 ENCOUNTER — Ambulatory Visit (INDEPENDENT_AMBULATORY_CARE_PROVIDER_SITE_OTHER): Payer: BC Managed Care – PPO | Admitting: Family Medicine

## 2014-03-03 VITALS — BP 95/63 | HR 81 | Temp 98.3°F | Resp 16 | Ht 67.25 in | Wt 148.2 lb

## 2014-03-03 DIAGNOSIS — E785 Hyperlipidemia, unspecified: Secondary | ICD-10-CM

## 2014-03-03 DIAGNOSIS — R110 Nausea: Secondary | ICD-10-CM

## 2014-03-03 DIAGNOSIS — Z124 Encounter for screening for malignant neoplasm of cervix: Secondary | ICD-10-CM

## 2014-03-03 DIAGNOSIS — E876 Hypokalemia: Secondary | ICD-10-CM

## 2014-03-03 DIAGNOSIS — I10 Essential (primary) hypertension: Secondary | ICD-10-CM

## 2014-03-03 LAB — CBC WITH DIFFERENTIAL/PLATELET
BASOS ABS: 0 10*3/uL (ref 0.0–0.1)
Basophils Relative: 0 % (ref 0–1)
EOS PCT: 3 % (ref 0–5)
Eosinophils Absolute: 0.2 10*3/uL (ref 0.0–0.7)
HCT: 41.3 % (ref 36.0–46.0)
Hemoglobin: 14.6 g/dL (ref 12.0–15.0)
LYMPHS PCT: 33 % (ref 12–46)
Lymphs Abs: 2.6 10*3/uL (ref 0.7–4.0)
MCH: 30.9 pg (ref 26.0–34.0)
MCHC: 35.4 g/dL (ref 30.0–36.0)
MCV: 87.3 fL (ref 78.0–100.0)
Monocytes Absolute: 0.8 10*3/uL (ref 0.1–1.0)
Monocytes Relative: 10 % (ref 3–12)
NEUTROS ABS: 4.3 10*3/uL (ref 1.7–7.7)
NEUTROS PCT: 54 % (ref 43–77)
PLATELETS: 282 10*3/uL (ref 150–400)
RBC: 4.73 MIL/uL (ref 3.87–5.11)
RDW: 14.6 % (ref 11.5–15.5)
WBC: 7.9 10*3/uL (ref 4.0–10.5)

## 2014-03-03 LAB — COMPLETE METABOLIC PANEL WITH GFR
ALT: 22 U/L (ref 0–35)
AST: 22 U/L (ref 0–37)
Albumin: 4.1 g/dL (ref 3.5–5.2)
Alkaline Phosphatase: 49 U/L (ref 39–117)
BUN: 12 mg/dL (ref 6–23)
CALCIUM: 9.4 mg/dL (ref 8.4–10.5)
CO2: 25 mEq/L (ref 19–32)
CREATININE: 0.79 mg/dL (ref 0.50–1.10)
Chloride: 99 mEq/L (ref 96–112)
GFR, Est African American: >89 mL/min
GFR, Est Non African American: 83 mL/min
Glucose, Bld: 110 mg/dL — ABNORMAL HIGH (ref 70–99)
POTASSIUM: 3 meq/L — AB (ref 3.5–5.3)
Sodium: 135 mEq/L (ref 135–145)
Total Bilirubin: 0.6 mg/dL (ref 0.2–1.2)
Total Protein: 6.8 g/dL (ref 6.0–8.3)

## 2014-03-03 LAB — LIPID PANEL
Cholesterol: 237 mg/dL — ABNORMAL HIGH (ref 0–200)
HDL: 47 mg/dL (ref >39)
LDL Cholesterol: 138 mg/dL — ABNORMAL HIGH (ref 0–99)
TRIGLYCERIDES: 260 mg/dL — AB (ref <150)
Total CHOL/HDL Ratio: 5 Ratio
VLDL: 52 mg/dL — ABNORMAL HIGH (ref 0–40)

## 2014-03-03 MED ORDER — ONDANSETRON 4 MG PO TBDP
4.0000 mg | ORAL_TABLET | Freq: Once | ORAL | Status: AC
  Administered 2014-03-03: 4 mg via ORAL

## 2014-03-03 MED ORDER — CLONIDINE HCL 0.2 MG PO TABS: 0.2000 mg | ORAL_TABLET | Freq: Three times a day (TID) | ORAL | Status: DC

## 2014-03-03 NOTE — Patient Instructions (Addendum)
Try melatonin every night Take benadryl at bedtime (can start with 1/2 of 25 mg) Change zoloft to bedtime Take clonidine 2x day. Please make an appointment with either Kayleen Memos [phone] or Vivia Budge [phone]

## 2014-03-03 NOTE — Progress Notes (Signed)
Subjective:    Patient ID: Beth Frey, female    DOB: 12-04-1955, 58 y.o.   MRN: [id number]  HPI Patient presents today for Pap smear. She has not had a complete physical in awhile. She is over due for her mammogram, but will schedule her own digital. She has had a screening colonoscopy and is due for a repeat- she has been contacted by GI.   She has had recent dental work and is having a lot of dental pain on her left lower jaw. She took an antiinflammatory medication that upset her stomach severely. She has an appointment later today with her dentist.  She does not sleep well, hasn't for years. Takes clonidine and ativan and has difficulty staying asleep. 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 She takes the clonidine for facial flushing, hand edema and tightness. She also thinks it helps with her anxiety. She takes 3-4 0.3 mg tablets of clonidine daily with ativan 1 mg. She denies dizziness, somnolence, balance problems.  Has suffered a lot of loss recently with younger brother passing away in April. Father and older brother passed. She is very close to her mother who is declining. She is currently taking sertraline 100 mg qam. She tried to wean to 50 mg, but found that she was more tense. In the distant past, she was on 150 mg briefly. She tolerated without problem, but was afraid it was making her fat. She has always taken it in the morning. She rarely drinks alcohol. 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 She is very passionate about fishing and she and her husband travel extensively on fishing trips. She worries about her weight and is distraught today that her weight is 148 pounds. She has had anorexia in the past. She denies bingeing, but reports that she can go all day without eating.      Review of Systems No suicidal ideation, no chest pain, no SOB, no urinary  frequency/dysuria/hematuria, occasional left knee pain.     Objective:   Physical Exam  Vitals reviewed. Constitutional: She is oriented to person, place, and time. She appears well-developed and well-nourished.  HENT:  Head: Normocephalic and atraumatic.  Right Ear: External ear normal.  Left Ear: External ear normal.  Nose: Nose normal.  Mouth/Throat: Oropharynx is clear and moist.  Slight swelling and tenderness along left lower jaw.    Eyes: Conjunctivae are normal. Pupils are equal, round, and reactive to light.  Neck: Normal range of motion. Neck supple.  Cardiovascular: Normal rate, regular rhythm and normal heart sounds.   Pulmonary/Chest: Effort normal and breath sounds normal.  Abdominal: Soft. Bowel sounds are normal. She exhibits no distension and no mass. There is no tenderness. There is no rebound and no guarding.  Genitourinary: Pelvic exam was performed with patient supine. There is no rash, tenderness, lesion or injury on the right labia. There is no rash, tenderness, lesion or injury on the left labia. Cervix exhibits no discharge and no friability. No erythema, tenderness or bleeding around the vagina. No signs of injury around the vagina. No vaginal discharge found.  Slight atrophy and dryness.   Musculoskeletal: Normal range of motion.  Lymphadenopathy:    She has cervical adenopathy (left, ant. cervical node palpable.).  Neurological: She is alert and oriented to person, place, and time.  Skin: Skin is warm and dry.  Multiple scars from  skin surgeries, scattered areas scabbing on arms and legs.    Psychiatric:  Tearful at times during visit. Slightly anxious appearing.     Orthostatic BPs- 102/50, 92/48, 94/54     Assessment & Plan:  Discussed with Dr. Everlene Farrier who also examined the patient 1. Nausea - ondansetron (ZOFRAN-ODT) disintegrating tablet 4 mg; Take 1 tablet (4 mg total) by mouth once. - ondansetron (ZOFRAN-ODT) disintegrating tablet 4 mg; Take 1  tablet (4 mg total) by mouth once.  2. Essential hypertension, benign -Concerned that she is taking too much clonidine, but she is reluctant to significantly lower her dose from 3-4x daily. Encouraged her to go to 0.2 mg tablet and try to take BID, definitely not more than TID. - cloNIDine (CATAPRES) 0.2 MG tablet; Take 1 tablet (0.2 mg total) by mouth 3 (three) times daily.  Dispense: 90 tablet; Refill: 2 - CBC with Differential - COMPLETE METABOLIC PANEL WITH GFR  3. Hyperlipidemia - Lipid panel  4. Chronic hypokalemia -CMP  5. Screening for cervical cancer - Pap IG, CT/NG w/ reflex HPV when ASC-U  6. Depression and anxiety with insomnia -Instructed her to start taking sertraline at bedtime, take melatonin nightly, can try diphenhydramine 12.5-25 mg qhs prn -Encouraged her to make an appointment with a counselor and provided her with names/phone numbers - Follow up in 6 weeks, sooner if needed.  Elby Beck, FNP-BC  [facility] [place]  03/04/2014 9:29 PM

## 2014-03-04 LAB — PAP IG, CT-NG, RFX HPV ASCU
Chlamydia Probe Amp: NEGATIVE
GC PROBE AMP: NEGATIVE

## 2014-03-06 ENCOUNTER — Telehealth: Payer: Self-pay

## 2014-03-06 ENCOUNTER — Other Ambulatory Visit: Payer: Self-pay | Admitting: Family Medicine

## 2014-03-06 DIAGNOSIS — E876 Hypokalemia: Secondary | ICD-10-CM

## 2014-03-06 MED ORDER — POTASSIUM CHLORIDE CRYS ER 20 MEQ PO TBCR: 20.0000 meq | EXTENDED_RELEASE_TABLET | Freq: Every day | ORAL | Status: DC

## 2014-03-06 NOTE — Telephone Encounter (Signed)
I cannot prescribe.dilauded over the phone for toothache. This has to be seen and properly documented. I am sorry for the inconvenience but that is essential.

## 2014-03-06 NOTE — Telephone Encounter (Signed)
Pt has a terrible toothache. Advised that she must come in to be seen.   She is looking for her phenergan refill- I do not see where we have ever prescribed this for her. She states Dr. Everlene Farrier rx'd for her to help with sleep issues.

## 2014-03-06 NOTE — Telephone Encounter (Signed)
Patient wants Dr Everlene Farrier to prescribe her "dilaudid" for her toothache. This is not a refill request,

## 2014-03-06 NOTE — Telephone Encounter (Signed)
I believe when she was seen earlier in the week she was headed to the dentist office for a reevaluation. I am sure he would be available to write for pain medications since he evaluated her tooth problem.

## 2014-03-06 NOTE — Telephone Encounter (Signed)
Pt will come in if needed. She is using the medication with what she has at home.

## 2014-03-16 ENCOUNTER — Other Ambulatory Visit: Payer: Self-pay | Admitting: Family Medicine

## 2014-03-16 ENCOUNTER — Telehealth: Payer: Self-pay

## 2014-03-16 NOTE — Telephone Encounter (Signed)
Faxed and pt notified on VM.

## 2014-03-16 NOTE — Telephone Encounter (Signed)
Pt calling in refill  LORazepam (ATIVAN) 1 MG tablet [[id number]]

## 2014-03-19 ENCOUNTER — Other Ambulatory Visit: Payer: Self-pay | Admitting: Family Medicine

## 2014-03-31 ENCOUNTER — Other Ambulatory Visit: Payer: Self-pay | Admitting: Family Medicine

## 2014-04-01 ENCOUNTER — Telehealth: Payer: Self-pay

## 2014-04-01 NOTE — Telephone Encounter (Signed)
The rx should be for 20 meq qd

## 2014-04-01 NOTE — Telephone Encounter (Signed)
Debbie, pharm sent fax stating that pt says she is supposed to be taking 20 meq of potassium BID and they need a new Rx. I see on your lab result notes that you want pt to take her inc potassium and re-check, but don't see a dose anywhere other than the Rx you sent at the time. Is the Rx you sent what you want pt taking, or did you intend for her to take it BID instead of QD?

## 2014-04-01 NOTE — Telephone Encounter (Signed)
The RX was for 30 tablets with 3 refills- I don't understand why a new RX is needed?

## 2014-04-03 NOTE — Telephone Encounter (Signed)
Called pharm and LM w/insr's that K+ is to be taken 20 meq QD , not BID.

## 2014-04-14 ENCOUNTER — Ambulatory Visit (INDEPENDENT_AMBULATORY_CARE_PROVIDER_SITE_OTHER): Payer: BC Managed Care – PPO | Admitting: Emergency Medicine

## 2014-04-14 ENCOUNTER — Encounter: Payer: Self-pay | Admitting: Emergency Medicine

## 2014-04-14 VITALS — BP 100/65 | HR 55 | Temp 97.7°F | Resp 16 | Ht 67.5 in | Wt 148.0 lb

## 2014-04-14 DIAGNOSIS — F411 Generalized anxiety disorder: Secondary | ICD-10-CM

## 2014-04-14 DIAGNOSIS — E876 Hypokalemia: Secondary | ICD-10-CM

## 2014-04-14 LAB — BASIC METABOLIC PANEL
BUN: 18 mg/dL (ref 6–23)
CALCIUM: 9.9 mg/dL (ref 8.4–10.5)
CHLORIDE: 96 meq/L (ref 96–112)
CO2: 24 mEq/L (ref 19–32)
CREATININE: 1.39 mg/dL — AB (ref 0.50–1.10)
Glucose, Bld: 93 mg/dL (ref 70–99)
Potassium: 3.3 mEq/L — ABNORMAL LOW (ref 3.5–5.3)
Sodium: 135 mEq/L (ref 135–145)

## 2014-04-14 MED ORDER — LORAZEPAM 1 MG PO TABS: 1.0000 mg | ORAL_TABLET | Freq: Four times a day (QID) | ORAL | Status: DC | PRN

## 2014-04-14 MED ORDER — POTASSIUM CHLORIDE CRYS ER 20 MEQ PO TBCR: 40.0000 meq | EXTENDED_RELEASE_TABLET | Freq: Every day | ORAL | Status: DC

## 2014-04-14 NOTE — Progress Notes (Signed)
   Subjective:    Patient ID: Beth Frey, female    DOB: 16-Dec-1955, 58 y.o.   MRN: [id number]  HPI This is a pleasant 58 yo female who is accompanied by her husband. The patient presents today for follow up of low potassium and depression/anxiety. She was taking 40 meq of potassium bid with good results, but her refill was done at 20 meq BID. Since she has been on lower dose, she has felt more fatigued. She was previously referred to renal, but did not go. She is willing to be referred today for evaluation of chronic hypokalemia.  She has been taking melatonin and benadryl nightly with big improvement in sleep. She and her mother are "letting things go," and her depression and anxiety are better than at her last visit.   Taking prilosec daily with some improvement of her nausea. Is eating well. Dental procedures completed. She took extra ativan last month while experiencing a lot of dental pain. She had a prescription for pain medication, but did not have it filled.   She decreased her clonidine slightly after the last visit and is willing to try to decrease a little more since her blood pressure is on the low side. Improved to 100/65 compared to 95/63 last visit. She insists on taking it every 8 hours for rebound flushing.   She commented that she is "fat." Has been eating more ice cream since dental problems started.  Review of Systems No chest pain, no SOB, no fever, no vomiting    Objective:   Physical Exam  Constitutional: She is oriented to person, place, and time. She appears well-developed and well-nourished.  HENT:  Head: Normocephalic and atraumatic.  Right Ear: External ear normal.  Left Ear: External ear normal.  Mouth/Throat: Oropharynx is clear and moist.  Eyes: Conjunctivae are normal.  Neck: Normal range of motion. Neck supple.  Cardiovascular: Normal rate, regular rhythm and normal heart sounds.   Pulmonary/Chest: Effort normal and breath sounds normal.    Musculoskeletal: Normal range of motion.  Neurological: She is alert and oriented to person, place, and time.  Skin: Skin is warm and dry.  Psychiatric:  Appears less anxious than previous visit.   Vitals reviewed. BP 100/65 mmHg  Pulse 55  Temp(Src) 97.7 F (36.5 C)  Resp 16  Ht 5' 7.5" (1.715 m)  Wt 148 lb (67.132 kg)  BMI 22.82 kg/m2  SpO2 100%    Assessment & Plan:  1. Chronic hypokalemia - Basic Metabolic Panel - Ambulatory referral to Nephrology  2. Generalized anxiety disorder - LORazepam (ATIVAN) 1 MG tablet; Take 1 tablet (1 mg total) by mouth every 6 (six) hours as needed for anxiety.  Dispense: 120 tablet; Refill: 0  3. Hypokalemia - potassium chloride SA (K-DUR,KLOR-CON) 20 MEQ tablet; Take 2 tablets (40 mEq total) by mouth daily.  Dispense: 120 tablet; Refill: 3  -Follow up in 4-6 weeks.  Elby Beck, FNP-BC  [facility] [place] [facility]  04/14/2014 2:41 PM

## 2014-04-14 NOTE — Patient Instructions (Addendum)
Try clonadine 0.2 mg every 8 hours Make appointment for mammogram We will call you regarding your renal appointment

## 2014-04-16 ENCOUNTER — Other Ambulatory Visit: Payer: Self-pay | Admitting: Family Medicine

## 2014-04-16 DIAGNOSIS — R7989 Other specified abnormal findings of blood chemistry: Secondary | ICD-10-CM

## 2014-04-23 ENCOUNTER — Other Ambulatory Visit: Payer: Self-pay | Admitting: Family Medicine

## 2014-05-12 ENCOUNTER — Ambulatory Visit: Payer: BC Managed Care – PPO | Admitting: Family Medicine

## 2014-05-17 ENCOUNTER — Encounter: Payer: Self-pay | Admitting: Family Medicine

## 2014-05-22 ENCOUNTER — Other Ambulatory Visit: Payer: Self-pay | Admitting: Family Medicine

## 2014-05-22 ENCOUNTER — Other Ambulatory Visit: Payer: Self-pay | Admitting: Emergency Medicine

## 2014-05-22 NOTE — Telephone Encounter (Signed)
Pt has called wanting a refill on LORazepam (ATIVAN) 1 MG tablet [[id number]. Her best friend has died. Please advise at [phone]

## 2014-05-23 NOTE — Telephone Encounter (Signed)
Called patient and left her a message. I can fax in an order tomorrow. Asked her to let me know via Mychart.

## 2014-05-24 ENCOUNTER — Encounter: Payer: Self-pay | Admitting: Family Medicine

## 2014-05-25 ENCOUNTER — Other Ambulatory Visit: Payer: Self-pay | Admitting: Family Medicine

## 2014-06-25 ENCOUNTER — Other Ambulatory Visit: Payer: Self-pay | Admitting: Family Medicine

## 2014-06-26 NOTE — Telephone Encounter (Signed)
I am out of the clinic until next week. Please call in this refill and make a note for the pharmacist to tell the patient that she needs an appointment before the next refill.

## 2014-06-26 NOTE — Telephone Encounter (Signed)
Called in Rx w/note to RTC bf next refill

## 2014-06-30 ENCOUNTER — Telehealth: Payer: Self-pay

## 2014-06-30 NOTE — Telephone Encounter (Signed)
Prescription was authorized on 06/26/14. I called the pharmacy and the prescription is filled and awaiting pick up. Called patient and left her message that her prescription is ready.

## 2014-06-30 NOTE — Telephone Encounter (Signed)
Pt states she is out of her ATIVAN 1MG S. Please call pt at [place]

## 2014-07-02 ENCOUNTER — Telehealth: Payer: Self-pay

## 2014-07-07 NOTE — Telephone Encounter (Signed)
No msg °

## 2014-07-15 ENCOUNTER — Other Ambulatory Visit: Payer: Self-pay | Admitting: Family Medicine

## 2014-07-15 NOTE — Telephone Encounter (Signed)
Patient was informed with last refill that she needs an office visit prior to further refills.

## 2014-07-20 NOTE — Telephone Encounter (Signed)
Called patient and left her a message that office visit is needed prior to further ativan refills.

## 2014-07-28 ENCOUNTER — Encounter: Payer: Self-pay | Admitting: Emergency Medicine

## 2014-07-28 ENCOUNTER — Ambulatory Visit (INDEPENDENT_AMBULATORY_CARE_PROVIDER_SITE_OTHER): Payer: BLUE CROSS/BLUE SHIELD | Admitting: Emergency Medicine

## 2014-07-28 VITALS — BP 115/76 | HR 74 | Temp 97.9°F | Resp 16 | Ht 67.5 in | Wt 144.0 lb

## 2014-07-28 DIAGNOSIS — Z1231 Encounter for screening mammogram for malignant neoplasm of breast: Secondary | ICD-10-CM | POA: Diagnosis not present

## 2014-07-28 DIAGNOSIS — E785 Hyperlipidemia, unspecified: Secondary | ICD-10-CM | POA: Diagnosis not present

## 2014-07-28 DIAGNOSIS — I10 Essential (primary) hypertension: Secondary | ICD-10-CM

## 2014-07-28 DIAGNOSIS — F329 Major depressive disorder, single episode, unspecified: Secondary | ICD-10-CM

## 2014-07-28 DIAGNOSIS — E559 Vitamin D deficiency, unspecified: Secondary | ICD-10-CM | POA: Diagnosis not present

## 2014-07-28 DIAGNOSIS — F411 Generalized anxiety disorder: Secondary | ICD-10-CM | POA: Diagnosis not present

## 2014-07-28 DIAGNOSIS — M810 Age-related osteoporosis without current pathological fracture: Secondary | ICD-10-CM | POA: Diagnosis not present

## 2014-07-28 DIAGNOSIS — F552 Abuse of laxatives: Secondary | ICD-10-CM

## 2014-07-28 DIAGNOSIS — F32A Depression, unspecified: Secondary | ICD-10-CM

## 2014-07-28 LAB — COMPLETE METABOLIC PANEL WITH GFR
ALBUMIN: 4.4 g/dL (ref 3.5–5.2)
ALT: 21 U/L (ref 0–35)
AST: 22 U/L (ref 0–37)
Alkaline Phosphatase: 43 U/L (ref 39–117)
BUN: 14 mg/dL (ref 6–23)
CO2: 22 mEq/L (ref 19–32)
Calcium: 9.2 mg/dL (ref 8.4–10.5)
Chloride: 102 mEq/L (ref 96–112)
Creat: 0.75 mg/dL (ref 0.50–1.10)
GFR, Est African American: >89 mL/min
GFR, Est Non African American: 89 mL/min
GLUCOSE: 94 mg/dL (ref 70–99)
POTASSIUM: 3.5 meq/L (ref 3.5–5.3)
SODIUM: 136 meq/L (ref 135–145)
TOTAL PROTEIN: 7.1 g/dL (ref 6.0–8.3)
Total Bilirubin: 0.7 mg/dL (ref 0.2–1.2)

## 2014-07-28 MED ORDER — LORAZEPAM 1 MG PO TABS: ORAL_TABLET | ORAL | Status: DC

## 2014-07-28 NOTE — Progress Notes (Addendum)
Subjective:  This chart was scribed for Beth Russian, MD by Ladene Artist, ED Scribe. The patient was seen in room 22. Patient's care was started at 10:36 AM.   Patient ID: Beth Frey, female    DOB: 02-07-1956, 59 y.o.   MRN: [id number]  Chief Complaint  Patient presents with  . Follow-up    potassium   HPI HPI Comments: Beth Frey is a 59 y.o. female, with a h/o skin CA, anxiety, osteoarthritis, who [facility] for a follow-up regarding low potassium.   Stress Pt reports increased stress related to the passing of her best friend and her mother falling. Pt states that her best friend was driving home on Christmas day from [place] when he had a heart attack, ran off the highway and his car exploded. 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 Pt also reports that her mother recently fell from a syncope episode and hit her face on the tire. Pt states that she will get her mother in to see Jola Baptist, PT. Pt denies depression and reports that she "has too much to live for" and has a good sense of humor. She copes with fishing, gardening, drawing, writing, photography, singing, playing the piano, visiting the ill at church and cooking for them. She reports experiencing a break down at least once a month. Pt does not seen a psychiatrist. She denies daily alcohol consumption but reports that she occasionally has a glass of wine or Martini with Dr. Rockie Neighbours.  Nephrologist  Pt was seen by nephrologist who suspects that low potassium was medicine related. Pt states that he suspected that low potassium levels are medicine related. Pt states that he did multiple studies which were all negative.   Fall Pt reports falling from her knee giving out. Pt suspects that this is arthritic. She reports exercising on a stationary bike although she states that she "can not seem to get rid of abdominal fat". 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   Insomnia Pt reports difficulty sleeping. She takes 20 mg Melatonin, Ativan and Benadryl nightly to assist with sleep.   Immunizations  Pt had tdap in June 2015.   Preventative Maintenance  Pt's mammogram is out of date. She has not had a bone density study done.  Pt's next fishing trip is May 5-7 in [place].  Past Medical History  Diagnosis Date  . Back pain, chronic   . Cecal bascule   . Osteoarthritis   . Cancer     skin  . Anxiety    Current Outpatient Prescriptions on File Prior to Visit  Medication Sig Dispense Refill  . cloNIDine (CATAPRES) 0.2 MG tablet take 1 tablet by mouth three times a day 90 tablet 4  . LORazepam (ATIVAN) 1 MG tablet take 1 tablet by mouth every 6 hours if needed for anxiety 120 tablet 0  . mometasone (NASONEX) 50 MCG/ACT nasal spray Place 2 sprays into the nose daily as needed (allergies).     . ondansetron (ZOFRAN-ODT) 8 MG disintegrating tablet Take 8 mg by mouth every 8 (eight) hours as needed for nausea or vomiting.    . polyethylene glycol (MIRALAX / GLYCOLAX) packet Take 17 g by mouth daily as needed.     . potassium chloride SA (K-DUR,KLOR-CON) 20 MEQ tablet Take 2 tablets (40 mEq total) by  mouth daily. 120 tablet 3  . sertraline (ZOLOFT) 100 MG tablet take 1 and 1/2 tablets by mouth once daily NEEDS OFFICE VISIT FOR ADDITIONAL REFILLS 45 tablet 4  . Vitamin D, Ergocalciferol, (DRISDOL) 50000 UNITS CAPS capsule TAKE 1 CAPSULE BY MOUTH EVERY 7 DAYS 4 capsule 0   No current facility-administered medications on file prior to visit.   Allergies  Allergen Reactions  . Sulfa Antibiotics Itching and Swelling    Throat, body swelling  . Doxycycline     Sun    Review of Systems  Musculoskeletal: Positive for arthralgias.  Psychiatric/Behavioral: Positive for sleep disturbance.      Objective:   Physical Exam CONSTITUTIONAL: Well developed/well nourished HEAD:  Normocephalic/atraumatic, cheeks are red EYES: EOMI/PERRL ENMT: Mucous membranes moist NECK: supple no meningeal signs, thyroid not enlarged SPINE/BACK:entire spine nontender CV: S1/S2 noted, no murmurs/rubs/gallops noted LUNGS: Lungs are clear to auscultation bilaterally, no apparent distress ABDOMEN: soft, nontender, no rebound or guarding, bowel sounds noted throughout abdomen, large midline scar, no masses felt GU:no cva tenderness NEURO: Pt is awake/alert/appropriate, moves all extremitiesx4. No facial droop.   EXTREMITIES: pulses normal/equal, full ROM SKIN: warm, color normal PSYCH: no abnormalities of mood noted, alert and oriented to situation    Assessment & Plan:  Patient here for repeat potassium. She did have a potassium checked with Dr. Justin Mend nephrologist on 04/22/2014. At that time her potassium was 3.6. Their diagnosis was laxative abuse. Patient does have a lot of concerns about her weight. I do think we should get a psychiatric assistance with her issues. She is currently taken for Ativan per 24 hours. I did not feel I should write her for any sleeping aid. Her blood pressure was perfect vital signs were perfect. Repeat labs were done. I did question her about laxative overuse. She states she has decreased her use of Dulcolax but still uses some MiraLAX. I told her this was still an issue and hopefully Dr. Lita Mains could help her with it.I personally performed the services described in this documentation, which was scribed in my presence. The recorded information has been reviewed and is accurate.

## 2014-07-29 ENCOUNTER — Encounter: Payer: Self-pay | Admitting: Emergency Medicine

## 2014-09-01 ENCOUNTER — Encounter: Payer: Self-pay | Admitting: Emergency Medicine

## 2014-09-18 ENCOUNTER — Other Ambulatory Visit: Payer: Self-pay | Admitting: Emergency Medicine

## 2014-09-19 NOTE — Telephone Encounter (Signed)
Rx faxed

## 2014-09-22 ENCOUNTER — Telehealth: Payer: Self-pay | Admitting: *Deleted

## 2014-09-22 NOTE — Telephone Encounter (Signed)
Faxed signed order for Bone Density with IVA if necessary, per Dr Everlene Farrier. Confirmation page received at 12:26 pm.

## 2014-09-26 ENCOUNTER — Other Ambulatory Visit: Payer: Self-pay | Admitting: Family Medicine

## 2014-09-28 NOTE — Telephone Encounter (Signed)
It doesn't look like we Rx'd this. Please advise on refill.

## 2014-09-29 ENCOUNTER — Emergency Department (HOSPITAL_BASED_OUTPATIENT_CLINIC_OR_DEPARTMENT_OTHER)
Admission: EM | Admit: 2014-09-29 | Discharge: 2014-09-29 | Disposition: A | Discharge status: Home / Self Care | Payer: BLUE CROSS/BLUE SHIELD | Attending: Emergency Medicine | Admitting: Emergency Medicine

## 2014-09-29 ENCOUNTER — Encounter (HOSPITAL_BASED_OUTPATIENT_CLINIC_OR_DEPARTMENT_OTHER): Payer: Self-pay | Admitting: Emergency Medicine

## 2014-09-29 DIAGNOSIS — S61250A Open bite of right index finger without damage to nail, initial encounter: Secondary | ICD-10-CM | POA: Diagnosis not present

## 2014-09-29 DIAGNOSIS — S61259A Open bite of unspecified finger without damage to nail, initial encounter: Secondary | ICD-10-CM

## 2014-09-29 DIAGNOSIS — Z8739 Personal history of other diseases of the musculoskeletal system and connective tissue: Secondary | ICD-10-CM | POA: Insufficient documentation

## 2014-09-29 DIAGNOSIS — Z85828 Personal history of other malignant neoplasm of skin: Secondary | ICD-10-CM | POA: Insufficient documentation

## 2014-09-29 DIAGNOSIS — Z7951 Long term (current) use of inhaled steroids: Secondary | ICD-10-CM | POA: Insufficient documentation

## 2014-09-29 DIAGNOSIS — S61551A Open bite of right wrist, initial encounter: Secondary | ICD-10-CM | POA: Diagnosis not present

## 2014-09-29 DIAGNOSIS — S61052A Open bite of left thumb without damage to nail, initial encounter: Secondary | ICD-10-CM | POA: Insufficient documentation

## 2014-09-29 DIAGNOSIS — Y9389 Activity, other specified: Secondary | ICD-10-CM | POA: Insufficient documentation

## 2014-09-29 DIAGNOSIS — W5501XA Bitten by cat, initial encounter: Secondary | ICD-10-CM | POA: Diagnosis not present

## 2014-09-29 DIAGNOSIS — L089 Local infection of the skin and subcutaneous tissue, unspecified: Secondary | ICD-10-CM

## 2014-09-29 DIAGNOSIS — Z8719 Personal history of other diseases of the digestive system: Secondary | ICD-10-CM | POA: Insufficient documentation

## 2014-09-29 DIAGNOSIS — Y998 Other external cause status: Secondary | ICD-10-CM | POA: Diagnosis not present

## 2014-09-29 DIAGNOSIS — Z79899 Other long term (current) drug therapy: Secondary | ICD-10-CM | POA: Insufficient documentation

## 2014-09-29 DIAGNOSIS — F419 Anxiety disorder, unspecified: Secondary | ICD-10-CM | POA: Insufficient documentation

## 2014-09-29 DIAGNOSIS — Y9289 Other specified places as the place of occurrence of the external cause: Secondary | ICD-10-CM | POA: Insufficient documentation

## 2014-09-29 MED ORDER — FLUCONAZOLE 150 MG PO TABS: ORAL_TABLET | ORAL | Status: DC

## 2014-09-29 MED ORDER — SODIUM CHLORIDE 0.9 % IV SOLN
3.0000 g | Freq: Once | INTRAVENOUS | Status: AC
  Administered 2014-09-29: 3 g via INTRAVENOUS
  Filled 2014-09-29: qty 3

## 2014-09-29 MED ORDER — KETOROLAC TROMETHAMINE 15 MG/ML IJ SOLN
15.0000 mg | Freq: Once | INTRAMUSCULAR | Status: AC
  Administered 2014-09-29: 15 mg via INTRAVENOUS
  Filled 2014-09-29: qty 1

## 2014-09-29 MED ORDER — AMOXICILLIN-POT CLAVULANATE 875-125 MG PO TABS: 1.0000 | ORAL_TABLET | Freq: Two times a day (BID) | ORAL | Status: DC

## 2014-09-29 NOTE — ED Notes (Signed)
Cat bite to left thumb and fingers and wrist to rt hand

## 2014-09-29 NOTE — ED Provider Notes (Signed)
CSN: [id number]     Arrival date & time 09/29/14  0528 History   First MD Initiated Contact with Patient 09/29/14 0536     Chief Complaint  Patient presents with  . Finger Infection      (Consider location/radiation/quality/duration/timing/severity/associated sxs/prior Treatment) HPI  This is a 59 year old female who recently took possession of a cat that belonged to a friend who died. The cat is up-to-date on its rabies immunizations.   She was breaking up a fight between the new cat and her old cat. This occurred about 4 PM yesterday afternoon. In the process she was bitten multiple times by the new cat. She was bitten on her right index finger her right wrist and her left thumb. She subsequently developed erythema and swelling primarily of the right index finger but also of the right wrist and left thumb area and she rates the pain in her right wrist as a 4 out of 10. She rates the pain in her right index finger as a 3 out of 10. Pain is worse with palpation or movement. Range of motion is decreased at the affected joints. She has not had a fever. There is been no purulent drainage.  Past Medical History  Diagnosis Date  . Back pain, chronic   . Cecal bascule   . Osteoarthritis   . Cancer     skin  . Anxiety    Past Surgical History  Procedure Laterality Date  . Wrist surgery  2009    right wrist  . Elbow surgery      left elbow  . Ovarian cyst removal      right  . Skin cancer excision      multiple-SCC and BCC  . Uterine ablation    . Tubal ligation    . Cholecystectomy    . Colon surgery  02/20/2010    Right hemicolectomy-cecal bascule  . Joint replacement  2006    Left total knee replacement  . Breast surgery  August 2011    Implants removed  . Ablation saphenous vein w/ rfa     Family History  Problem Relation Age of Onset  . Cancer Father     stomach cancer  . Cancer Brother     pancreatic cancer  . Stroke Maternal Grandmother   . Cancer Maternal  Grandfather     lung cancer  . Cancer Paternal Grandmother     ovarian  . Heart disease Paternal Grandfather    History  Substance Use Topics  . Smoking status: Never Smoker   . Smokeless tobacco: Never Used  . Alcohol Use: No   OB History    No data available     Review of Systems  All other systems reviewed and are negative.   Allergies  Sulfa antibiotics and Doxycycline  Home Medications   Prior to Admission medications   Medication Sig Start Date End Date Taking? Authorizing Provider  cloNIDine (CATAPRES) 0.2 MG tablet take 1 tablet by mouth three times a day 05/26/14   Elby Beck, FNP  LORazepam (ATIVAN) 1 MG tablet take 1 tablet by mouth every 6 hours if needed for anxiety 09/18/14   Darlyne Russian, MD  mometasone (NASONEX) 50 MCG/ACT nasal spray Place 2 sprays into the nose daily as needed (allergies).     Historical Provider, MD  ondansetron (ZOFRAN-ODT) 8 MG disintegrating tablet Take 8 mg by mouth every 8 (eight) hours as needed for nausea or vomiting.    Historical Provider, MD  polyethylene glycol (MIRALAX / GLYCOLAX) packet Take 17 g by mouth daily as needed.     Historical Provider, MD  potassium chloride SA (K-DUR,KLOR-CON) 20 MEQ tablet Take 2 tablets (40 mEq total) by mouth daily. 04/14/14   Elby Beck, FNP  sertraline (ZOLOFT) 100 MG tablet take 1 and 1/2 tablets by mouth once daily NEEDS OFFICE VISIT FOR ADDITIONAL REFILLS 05/22/14   Elby Beck, FNP  Vitamin D, Ergocalciferol, (DRISDOL) 50000 UNITS CAPS capsule TAKE 1 CAPSULE BY MOUTH EVERY 7 DAYS 03/20/14   Chelle Jeffery, PA-C   BP 134/78 mmHg  Pulse 82  Temp(Src) 98.1 F (36.7 C) (Oral)  Resp 18  Ht 5' 7.5" (1.715 m)  Wt 145 lb (65.772 kg)  BMI 22.36 kg/m2  SpO2 99%   Physical Exam  General: Well-developed, well-nourished female in no acute distress; appearance consistent with age of record HENT: normocephalic; atraumatic Eyes: pupils equal, round and reactive to light;  extraocular muscles intact Neck: supple Heart: regular rate and rhythm Lungs: clear to auscultation bilaterally Abdomen: soft; nondistended; nontender; no masses or hepatosplenomegaly; bowel sounds present; well healed midline surgical incision Extremities: No deformity; full range of motion; pulses normal; puncture wounds with surrounding erythema and tenderness of the right index finger, puncture wounds and tenderness of the right wrist and abrasion and tenderness of the left thumb:        Neurologic: Awake, alert and oriented; motor function intact in all extremities and symmetric; no facial droop Skin: Warm and dry Psychiatric: Normal mood and affect    ED Course  Procedures (including critical care time)   MDM  5:53 AM Unasyn 3 grams given for infected cat bites.     Shanon Rosser, MD 09/29/14 [phone]

## 2014-09-29 NOTE — ED Notes (Signed)
Patient states that she was bit by a cat on bilateral hands.

## 2014-10-01 ENCOUNTER — Telehealth: Payer: Self-pay | Admitting: *Deleted

## 2014-10-01 NOTE — Telephone Encounter (Signed)
Phoned & spoke with Kennyth Lose at [facility] and she is faxing me copies of both mammo & dexa scan from 09/2014.

## 2014-10-01 NOTE — Telephone Encounter (Signed)
After numerous attempts at [facility] from [place] called and they are going to mail both documents directly to me (something wrong on their end with fax machine).  Will update health maintenance once received.

## 2014-10-06 ENCOUNTER — Ambulatory Visit (INDEPENDENT_AMBULATORY_CARE_PROVIDER_SITE_OTHER): Payer: BLUE CROSS/BLUE SHIELD | Admitting: Emergency Medicine

## 2014-10-06 ENCOUNTER — Encounter: Payer: Self-pay | Admitting: Emergency Medicine

## 2014-10-06 VITALS — BP 108/69 | HR 70 | Temp 98.6°F | Resp 16 | Ht 67.0 in | Wt 151.4 lb

## 2014-10-06 DIAGNOSIS — E041 Nontoxic single thyroid nodule: Secondary | ICD-10-CM | POA: Diagnosis not present

## 2014-10-06 DIAGNOSIS — F552 Abuse of laxatives: Secondary | ICD-10-CM | POA: Diagnosis not present

## 2014-10-06 DIAGNOSIS — E876 Hypokalemia: Secondary | ICD-10-CM

## 2014-10-06 DIAGNOSIS — R112 Nausea with vomiting, unspecified: Secondary | ICD-10-CM

## 2014-10-06 DIAGNOSIS — F411 Generalized anxiety disorder: Secondary | ICD-10-CM

## 2014-10-06 DIAGNOSIS — M810 Age-related osteoporosis without current pathological fracture: Secondary | ICD-10-CM

## 2014-10-06 DIAGNOSIS — E559 Vitamin D deficiency, unspecified: Secondary | ICD-10-CM

## 2014-10-06 LAB — BASIC METABOLIC PANEL WITH GFR
BUN: 12 mg/dL (ref 6–23)
CHLORIDE: 102 meq/L (ref 96–112)
CO2: 23 mEq/L (ref 19–32)
Calcium: 8.9 mg/dL (ref 8.4–10.5)
Creat: 0.76 mg/dL (ref 0.50–1.10)
GFR, Est Non African American: 87 mL/min
GLUCOSE: 107 mg/dL — AB (ref 70–99)
POTASSIUM: 3.7 meq/L (ref 3.5–5.3)
Sodium: 138 mEq/L (ref 135–145)

## 2014-10-06 MED ORDER — ONDANSETRON 8 MG PO TBDP: ORAL_TABLET | ORAL | Status: DC
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
## 2014-10-07 NOTE — Telephone Encounter (Signed)
Phoned and spoke with [name] at [facility] (in [facility]) and she is attempting to fax dexa scan & mammo again.

## 2014-10-07 NOTE — Telephone Encounter (Signed)
Received copy of dexa scan results, but not the mammo.  Called med rec from office back & LMTC/send mammo results.

## 2014-10-09 ENCOUNTER — Ambulatory Visit
Admission: RE | Admit: 2014-10-09 | Discharge: 2014-10-09 | Disposition: A | Discharge status: Home / Self Care | Payer: BLUE CROSS/BLUE SHIELD | Source: Ambulatory Visit | Attending: Emergency Medicine | Admitting: Emergency Medicine

## 2014-10-09 DIAGNOSIS — E041 Nontoxic single thyroid nodule: Secondary | ICD-10-CM

## 2014-10-13 ENCOUNTER — Other Ambulatory Visit: Payer: Self-pay | Admitting: Family Medicine

## 2014-10-13 ENCOUNTER — Telehealth: Payer: Self-pay | Admitting: Emergency Medicine

## 2014-10-13 DIAGNOSIS — E041 Nontoxic single thyroid nodule: Secondary | ICD-10-CM

## 2014-10-13 NOTE — Telephone Encounter (Signed)
Called and spoke with patient. She is agreeable to have the lesion seen on ultrasound biopsied.

## 2014-10-15 ENCOUNTER — Encounter: Payer: Self-pay | Admitting: Emergency Medicine

## 2014-10-16 ENCOUNTER — Other Ambulatory Visit: Payer: Self-pay | Admitting: Family Medicine

## 2014-10-16 NOTE — Telephone Encounter (Signed)
Received mammo results from [facility] dated 09/22/2014 and someone had already entered & scanned... Health maintenance already updated.

## 2014-10-20 ENCOUNTER — Encounter: Payer: Self-pay | Admitting: Emergency Medicine

## 2014-11-09 NOTE — ED Provider Notes (Signed)
11/09/2014 Paper prescription written for Bactroban 2% 30g tube no refills (cannot write Rx via Epic if more than 2 hours after discharge) for mild recurrence of symptoms. Apply to affected area TID.   Shanon Rosser, MD 11/09/14 [phone]

## 2014-11-10 ENCOUNTER — Emergency Department (HOSPITAL_BASED_OUTPATIENT_CLINIC_OR_DEPARTMENT_OTHER)
Admission: EM | Admit: 2014-11-10 | Discharge: 2014-11-11 | Disposition: A | Discharge status: Home / Self Care | Payer: BLUE CROSS/BLUE SHIELD | Attending: Emergency Medicine | Admitting: Emergency Medicine

## 2014-11-10 ENCOUNTER — Encounter (HOSPITAL_BASED_OUTPATIENT_CLINIC_OR_DEPARTMENT_OTHER): Payer: Self-pay | Admitting: Emergency Medicine

## 2014-11-10 ENCOUNTER — Emergency Department (HOSPITAL_BASED_OUTPATIENT_CLINIC_OR_DEPARTMENT_OTHER): Payer: BLUE CROSS/BLUE SHIELD

## 2014-11-10 DIAGNOSIS — Y9289 Other specified places as the place of occurrence of the external cause: Secondary | ICD-10-CM | POA: Insufficient documentation

## 2014-11-10 DIAGNOSIS — Z79899 Other long term (current) drug therapy: Secondary | ICD-10-CM | POA: Diagnosis not present

## 2014-11-10 DIAGNOSIS — Z7951 Long term (current) use of inhaled steroids: Secondary | ICD-10-CM | POA: Diagnosis not present

## 2014-11-10 DIAGNOSIS — F419 Anxiety disorder, unspecified: Secondary | ICD-10-CM | POA: Insufficient documentation

## 2014-11-10 DIAGNOSIS — G8929 Other chronic pain: Secondary | ICD-10-CM | POA: Insufficient documentation

## 2014-11-10 DIAGNOSIS — Y998 Other external cause status: Secondary | ICD-10-CM | POA: Insufficient documentation

## 2014-11-10 DIAGNOSIS — Y288XXA Contact with other sharp object, undetermined intent, initial encounter: Secondary | ICD-10-CM | POA: Insufficient documentation

## 2014-11-10 DIAGNOSIS — S66327A Laceration of extensor muscle, fascia and tendon of left little finger at wrist and hand level, initial encounter: Secondary | ICD-10-CM | POA: Insufficient documentation

## 2014-11-10 DIAGNOSIS — Z8739 Personal history of other diseases of the musculoskeletal system and connective tissue: Secondary | ICD-10-CM | POA: Diagnosis not present

## 2014-11-10 DIAGNOSIS — Y9389 Activity, other specified: Secondary | ICD-10-CM | POA: Insufficient documentation

## 2014-11-10 DIAGNOSIS — S61217A Laceration without foreign body of left little finger without damage to nail, initial encounter: Secondary | ICD-10-CM | POA: Diagnosis present

## 2014-11-10 DIAGNOSIS — S61209A Unspecified open wound of unspecified finger without damage to nail, initial encounter: Secondary | ICD-10-CM

## 2014-11-10 DIAGNOSIS — Z8719 Personal history of other diseases of the digestive system: Secondary | ICD-10-CM | POA: Diagnosis not present

## 2014-11-10 DIAGNOSIS — Z85828 Personal history of other malignant neoplasm of skin: Secondary | ICD-10-CM | POA: Insufficient documentation

## 2014-11-10 DIAGNOSIS — S56429A Laceration of extensor muscle, fascia and tendon of unspecified finger at forearm level, initial encounter: Secondary | ICD-10-CM

## 2014-11-10 DIAGNOSIS — IMO0002 Reserved for concepts with insufficient information to code with codable children: Secondary | ICD-10-CM

## 2014-11-10 MED ORDER — OXYCODONE-ACETAMINOPHEN 5-325 MG PO TABS
2.0000 | ORAL_TABLET | Freq: Once | ORAL | Status: AC
  Administered 2014-11-10: 2 via ORAL
  Filled 2014-11-10: qty 2

## 2014-11-10 MED ORDER — ONDANSETRON 8 MG PO TBDP
ORAL_TABLET | ORAL | Status: AC
  Administered 2014-11-10: 8 mg via ORAL
  Filled 2014-11-10: qty 1

## 2014-11-10 MED ORDER — LIDOCAINE-EPINEPHRINE (PF) 2 %-1:200000 IJ SOLN
20.0000 mL | Freq: Once | INTRAMUSCULAR | Status: AC
  Administered 2014-11-11: 20 mL via INTRADERMAL
  Filled 2014-11-10: qty 20

## 2014-11-10 MED ORDER — ONDANSETRON 8 MG PO TBDP
8.0000 mg | ORAL_TABLET | Freq: Once | ORAL | Status: AC
  Administered 2014-11-10: 8 mg via ORAL

## 2014-11-10 NOTE — ED Provider Notes (Signed)
CSN: [id number]     Arrival date & time 11/10/14  2206 History   First MD Initiated Contact with Patient 11/10/14 2219     Chief Complaint  Patient presents with  . Extremity Laceration     (Consider location/radiation/quality/duration/timing/severity/associated sxs/prior Treatment) Patient is a 59 y.o. female presenting with skin laceration.  Laceration Location: L 5th MCP dorsum. Length (cm):  2 Depth:  Through muscle Quality: straight   Bleeding: controlled   Time since incident:  1 hour Laceration mechanism:  Knife Pain details:    Quality:  Aching   Severity:  Moderate   Timing:  Constant   Progression:  Unchanged Foreign body present:  No foreign bodies Relieved by:  Nothing Worsened by:  Movement and pressure Ineffective treatments:  None tried Tetanus status:  Up to date   Past Medical History  Diagnosis Date  . Back pain, chronic   . Cecal bascule   . Osteoarthritis   . Cancer     skin  . Anxiety    Past Surgical History  Procedure Laterality Date  . Wrist surgery  2009    right wrist  . Elbow surgery      left elbow  . Ovarian cyst removal      right  . Skin cancer excision      multiple-SCC and BCC  . Uterine ablation    . Tubal ligation    . Cholecystectomy    . Colon surgery  02/20/2010    Right hemicolectomy-cecal bascule  . Joint replacement  2006    Left total knee replacement  . Breast surgery  August 2011    Implants removed  . Ablation saphenous vein w/ rfa     Family History  Problem Relation Age of Onset  . Cancer Father     stomach cancer  . Cancer Brother     pancreatic cancer  . Stroke Maternal Grandmother   . Cancer Maternal Grandfather     lung cancer  . Cancer Paternal Grandmother     ovarian  . Heart disease Paternal Grandfather    History  Substance Use Topics  . Smoking status: Never Smoker   . Smokeless tobacco: Never Used  . Alcohol Use: No   OB History    No data available     Review of Systems  All  other systems reviewed and are negative.     Allergies  Sulfa antibiotics and Doxycycline  Home Medications   Prior to Admission medications   Medication Sig Start Date End Date Taking? Authorizing Provider  cephALEXin (KEFLEX) 500 MG capsule Take 1 capsule (500 mg total) by mouth 4 (four) times daily. 11/11/14   Debby Freiberg, MD  cloNIDine (CATAPRES) 0.2 MG tablet take 1 tablet by mouth three times a day 10/16/14   Elby Beck, FNP  HYDROcodone-acetaminophen (NORCO/VICODIN) 5-325 MG per tablet Take 1-2 tablets by mouth every 4 (four) hours as needed. 11/11/14   Debby Freiberg, MD  LORazepam (ATIVAN) 1 MG tablet take 1 tablet by mouth every 6 hours if needed for anxiety 09/18/14   Darlyne Russian, MD  mometasone (NASONEX) 50 MCG/ACT nasal spray Place 2 sprays into the nose daily as needed (allergies).     Historical Provider, MD  ondansetron (ZOFRAN-ODT) 8 MG disintegrating tablet dissolve 1 tablet ON TONGUE every 8 hours if needed 10/06/14   Darlyne Russian, MD  polyethylene glycol (MIRALAX / GLYCOLAX) packet Take 17 g by mouth daily as needed.  Historical Provider, MD  potassium chloride SA (K-DUR,KLOR-CON) 20 MEQ tablet Take 2 tablets (40 mEq total) by mouth daily. 04/14/14   Elby Beck, FNP  sertraline (ZOLOFT) 100 MG tablet take 1 and 1/2 tablets by mouth once daily NEEDS OFFICE VISIT FOR ADDITIONAL REFILLS 10/16/14   Elby Beck, FNP  Vitamin D, Ergocalciferol, (DRISDOL) 50000 UNITS CAPS capsule TAKE 1 CAPSULE BY MOUTH EVERY 7 DAYS 03/20/14   Chelle Jeffery, PA-C   BP 141/76 mmHg  Pulse 82  Temp(Src) 98.2 F (36.8 C) (Oral)  Resp 18  Ht 5\' 8"  (1.727 m)  Wt 135 lb (61.236 kg)  BMI 20.53 kg/m2  SpO2 100% Physical Exam  Constitutional: She is oriented to person, place, and time. She appears well-developed and well-nourished.  HENT:  Head: Normocephalic and atraumatic.  Right Ear: External ear normal.  Left Ear: External ear normal.  Eyes: Conjunctivae and EOM are  normal. Pupils are equal, round, and reactive to light.  Neck: Normal range of motion. Neck supple.  Cardiovascular: Normal rate, regular rhythm, normal heart sounds and intact distal pulses.   Pulmonary/Chest: Effort normal and breath sounds normal.  Abdominal: Soft. Bowel sounds are normal. There is no tenderness.  Musculoskeletal: Normal range of motion.  2 cm laceration over dorsum of L 5th MCP, sensation intact, pt has intact, but weak extension, flexion difficult to assess  Neurological: She is alert and oriented to person, place, and time.  Skin: Skin is warm and dry.  Vitals reviewed.   ED Course  LACERATION REPAIR Date/Time: 11/11/2014 12:36 AM Performed by: Debby Freiberg Authorized by: Debby Freiberg Consent: Verbal consent obtained. Location: l 5th MCP dorsum. Laceration length: 3 cm Tendon involvement: none Nerve involvement: none Vascular damage: no Anesthesia: local infiltration Local anesthetic: lidocaine 2% with epinephrine Preparation: Patient was prepped and draped in the usual sterile fashion. Irrigation solution: saline Irrigation method: syringe Amount of cleaning: standard Debridement: none Degree of undermining: none Skin closure: 5-0 Prolene Number of sutures: 4 Technique: simple Approximation: close Approximation difficulty: simple Patient tolerance: Patient tolerated the procedure well with no immediate complications   (including critical care time) Labs Review Labs Reviewed - No data to display  Imaging Review Dg Finger Little Left  11/10/2014   CLINICAL DATA:  Laceration to the left fifth finger. Initial encounter.  EXAM: LEFT LITTLE FINGER 2+V  COMPARISON:  None.  FINDINGS: Scattered minimal high-density debris is noted along the soft tissue wound overlying the distal fifth metacarpal.  Associated soft tissue swelling is noted. There is no evidence of osseous disruption. Visualized joint spaces are preserved.  IMPRESSION: 1. No evidence of  osseous disruption. 2. Scattered minimal high-density debris noted along the soft tissue wound overlying the distal fifth metacarpal.   Electronically Signed   By: Garald Balding M.D.   On: 11/10/2014 23:27     EKG Interpretation None      MDM   Final diagnoses:  Laceration  Extensor tendon laceration, finger, open wound, initial encounter    59 y.o. female with pertinent PMH of anxiety presents with laceration as above.  Exam consistent with partial extensor tendon disruption.  Sensation intact.  XR unremarkable.  Consulted hand.    Hand requested primary closure.  This was performed as above, pt to fu with hand in 1 day.  TDAP UTD.  PPX abx prescribed  I have reviewed all laboratory and imaging studies if ordered as above  1. Laceration   2. Extensor tendon laceration, finger, open wound,  initial encounter         Debby Freiberg, MD 11/11/14 [phone]

## 2014-11-10 NOTE — ED Notes (Signed)
Cut knuckle of left hand over little finger  Bleeding controlled

## 2014-11-10 NOTE — ED Notes (Signed)
Patient states that she accidently cut her self with a sharp tool earlier.

## 2014-11-11 MED ORDER — CEPHALEXIN 500 MG PO CAPS: 500.0000 mg | ORAL_CAPSULE | Freq: Four times a day (QID) | ORAL | Status: DC

## 2014-11-11 MED ORDER — HYDROCODONE-ACETAMINOPHEN 5-325 MG PO TABS: 1.0000 | ORAL_TABLET | ORAL | Status: DC | PRN

## 2014-11-11 NOTE — Discharge Instructions (Signed)
Laceration Care, Adult °A laceration is a cut or lesion that goes through all layers of the skin and into the tissue just beneath the skin. °TREATMENT  °Some lacerations may not require closure. Some lacerations may not be able to be closed due to an increased risk of infection. It is important to see your caregiver as soon as possible after an injury to minimize the risk of infection and maximize the opportunity for successful closure. °If closure is appropriate, pain medicines may be given, if needed. The wound will be cleaned to help prevent infection. Your caregiver will use stitches (sutures), staples, wound glue (adhesive), or skin adhesive strips to repair the laceration. These tools bring the skin edges together to allow for faster healing and a better cosmetic outcome. However, all wounds will heal with a scar. Once the wound has healed, scarring can be minimized by covering the wound with sunscreen during the day for 1 full year. °HOME CARE INSTRUCTIONS  °For sutures or staples: °· Keep the wound clean and dry. °· If you were given a bandage (dressing), you should change it at least once a day. Also, change the dressing if it becomes wet or dirty, or as directed by your caregiver. °· Wash the wound with soap and water 2 times a day. Rinse the wound off with water to remove all soap. Pat the wound dry with a clean towel. °· After cleaning, apply a thin layer of the antibiotic ointment as recommended by your caregiver. This will help prevent infection and keep the dressing from sticking. °· You may shower as usual after the first 24 hours. Do not soak the wound in water until the sutures are removed. °· Only take over-the-counter or prescription medicines for pain, discomfort, or fever as directed by your caregiver. °· Get your sutures or staples removed as directed by your caregiver. °For skin adhesive strips: °· Keep the wound clean and dry. °· Do not get the skin adhesive strips wet. You may bathe  carefully, using caution to keep the wound dry. °· If the wound gets wet, pat it dry with a clean towel. °· Skin adhesive strips will fall off on their own. You may trim the strips as the wound heals. Do not remove skin adhesive strips that are still stuck to the wound. They will fall off in time. °For wound adhesive: °· You may briefly wet your wound in the shower or bath. Do not soak or scrub the wound. Do not swim. Avoid periods of heavy perspiration until the skin adhesive has fallen off on its own. After showering or bathing, gently pat the wound dry with a clean towel. °· Do not apply liquid medicine, cream medicine, or ointment medicine to your wound while the skin adhesive is in place. This may loosen the film before your wound is healed. °· If a dressing is placed over the wound, be careful not to apply tape directly over the skin adhesive. This may cause the adhesive to be pulled off before the wound is healed. °· Avoid prolonged exposure to sunlight or tanning lamps while the skin adhesive is in place. Exposure to ultraviolet light in the first year will darken the scar. °· The skin adhesive will usually remain in place for 5 to 10 days, then naturally fall off the skin. Do not pick at the adhesive film. °You may need a tetanus shot if: °· You cannot remember when you had your last tetanus shot. °· You have never had a tetanus   shot. If you get a tetanus shot, your arm may swell, get red, and feel warm to the touch. This is common and not a problem. If you need a tetanus shot and you choose not to have one, there is a rare chance of getting tetanus. Sickness from tetanus can be serious. SEEK MEDICAL CARE IF:   You have redness, swelling, or increasing pain in the wound.  You see a red line that goes away from the wound.  You have yellowish-white fluid (pus) coming from the wound.  You have a fever.  You notice a bad smell coming from the wound or dressing.  Your wound breaks open before or  after sutures have been removed.  You notice something coming out of the wound such as wood or glass.  Your wound is on your hand or foot and you cannot move a finger or toe. SEEK IMMEDIATE MEDICAL CARE IF:   Your pain is not controlled with prescribed medicine.  You have severe swelling around the wound causing pain and numbness or a change in color in your arm, hand, leg, or foot.  Your wound splits open and starts bleeding.  You have worsening numbness, weakness, or loss of function of any joint around or beyond the wound.  You develop painful lumps near the wound or on the skin anywhere on your body. MAKE SURE YOU:   Understand these instructions.  Will watch your condition.  Will get help right away if you are not doing well or get worse. Document Released: 04/24/2005 Document Revised: 07/17/2011 Document Reviewed: 10/18/2010 [facility] Patient Information 2015 [place], [place]. This information is not intended to replace advice given to you by your health care provider. Make sure you discuss any questions you have with your health care provider. Tendon Injury Tendons are strong, cordlike structures that connect muscle to bone. Tendons are made up of woven fibers, like a rope. A tendon injury is a tear (rupture) of the tendon. The rupture may be partial (only a few of the fibers in your tendon rupture) or complete (your entire tendon ruptures). CAUSES  Tendon injuries can be caused by high-stress activities, such as sports. They also can be caused by a repetitive injury or by a single injury from an excessive, rapid force. SYMPTOMS  Symptoms of tendon injury include pain when you move the joint close to the tendon. Other symptoms are swelling, redness, and warmth. DIAGNOSIS  Tendon injuries often can be diagnosed by physical exam. However, sometimes an X-ray exam or advanced imaging, such as magnetic resonance imaging (MRI), is necessary to determine the extent of the  injury. TREATMENT  Partial tendon ruptures often can be treated with immobilization. A splint, bandage, or removable brace usually is used to immobilize the injured tendon. Most injured tendons need to be immobilized for 1-2 months before they are completely healed. Complete tendon ruptures may require surgical reattachment. Document Released: 06/01/2004 Document Revised: 04/13/2011 Document Reviewed: 07/16/2011 [facility] Patient Information 2015 [place], [place]. This information is not intended to replace advice given to you by your health care provider. Make sure you discuss any questions you have with your health care provider.

## 2014-11-12 ENCOUNTER — Inpatient Hospital Stay: Admission: RE | Admit: 2014-11-12 | Payer: BLUE CROSS/BLUE SHIELD | Source: Ambulatory Visit

## 2014-11-21 ENCOUNTER — Other Ambulatory Visit: Payer: Self-pay | Admitting: Emergency Medicine

## 2014-11-26 ENCOUNTER — Other Ambulatory Visit: Payer: Self-pay

## 2014-11-26 NOTE — Telephone Encounter (Signed)
Rx for Lorazepam faxed to her pharmacy.

## 2014-12-21 ENCOUNTER — Encounter (HOSPITAL_BASED_OUTPATIENT_CLINIC_OR_DEPARTMENT_OTHER): Payer: Self-pay | Admitting: *Deleted

## 2014-12-21 ENCOUNTER — Emergency Department (HOSPITAL_BASED_OUTPATIENT_CLINIC_OR_DEPARTMENT_OTHER)
Admission: EM | Admit: 2014-12-21 | Discharge: 2014-12-22 | Disposition: A | Discharge status: Home / Self Care | Payer: BLUE CROSS/BLUE SHIELD | Attending: Emergency Medicine | Admitting: Emergency Medicine

## 2014-12-21 ENCOUNTER — Emergency Department (HOSPITAL_BASED_OUTPATIENT_CLINIC_OR_DEPARTMENT_OTHER): Payer: BLUE CROSS/BLUE SHIELD

## 2014-12-21 DIAGNOSIS — G8929 Other chronic pain: Secondary | ICD-10-CM | POA: Insufficient documentation

## 2014-12-21 DIAGNOSIS — M1990 Unspecified osteoarthritis, unspecified site: Secondary | ICD-10-CM | POA: Diagnosis not present

## 2014-12-21 DIAGNOSIS — Y998 Other external cause status: Secondary | ICD-10-CM | POA: Diagnosis not present

## 2014-12-21 DIAGNOSIS — Z79899 Other long term (current) drug therapy: Secondary | ICD-10-CM | POA: Diagnosis not present

## 2014-12-21 DIAGNOSIS — S6992XA Unspecified injury of left wrist, hand and finger(s), initial encounter: Secondary | ICD-10-CM | POA: Insufficient documentation

## 2014-12-21 DIAGNOSIS — Z792 Long term (current) use of antibiotics: Secondary | ICD-10-CM | POA: Insufficient documentation

## 2014-12-21 DIAGNOSIS — S92511A Displaced fracture of proximal phalanx of right lesser toe(s), initial encounter for closed fracture: Secondary | ICD-10-CM | POA: Diagnosis not present

## 2014-12-21 DIAGNOSIS — Y9389 Activity, other specified: Secondary | ICD-10-CM | POA: Insufficient documentation

## 2014-12-21 DIAGNOSIS — F419 Anxiety disorder, unspecified: Secondary | ICD-10-CM | POA: Insufficient documentation

## 2014-12-21 DIAGNOSIS — S92911A Unspecified fracture of right toe(s), initial encounter for closed fracture: Secondary | ICD-10-CM

## 2014-12-21 DIAGNOSIS — S99921A Unspecified injury of right foot, initial encounter: Secondary | ICD-10-CM | POA: Diagnosis present

## 2014-12-21 DIAGNOSIS — Z7951 Long term (current) use of inhaled steroids: Secondary | ICD-10-CM | POA: Insufficient documentation

## 2014-12-21 DIAGNOSIS — W2209XA Striking against other stationary object, initial encounter: Secondary | ICD-10-CM | POA: Insufficient documentation

## 2014-12-21 DIAGNOSIS — Y9289 Other specified places as the place of occurrence of the external cause: Secondary | ICD-10-CM | POA: Insufficient documentation

## 2014-12-21 DIAGNOSIS — Z8719 Personal history of other diseases of the digestive system: Secondary | ICD-10-CM | POA: Insufficient documentation

## 2014-12-21 DIAGNOSIS — Z85828 Personal history of other malignant neoplasm of skin: Secondary | ICD-10-CM | POA: Diagnosis not present

## 2014-12-21 LAB — BASIC METABOLIC PANEL
Anion gap: 16 — ABNORMAL HIGH (ref 5–15)
BUN: 13 mg/dL (ref 6–20)
CALCIUM: 9.3 mg/dL (ref 8.9–10.3)
CO2: 21 mmol/L — ABNORMAL LOW (ref 22–32)
Chloride: 101 mmol/L (ref 101–111)
Creatinine, Ser: 0.83 mg/dL (ref 0.44–1.00)
Glucose, Bld: 113 mg/dL — ABNORMAL HIGH (ref 65–99)
Potassium: 2.9 mmol/L — ABNORMAL LOW (ref 3.5–5.1)
Sodium: 138 mmol/L (ref 135–145)

## 2014-12-21 MED ORDER — OXYCODONE-ACETAMINOPHEN 5-325 MG PO TABS: 1.0000 | ORAL_TABLET | ORAL | Status: DC | PRN

## 2014-12-21 MED ORDER — POTASSIUM CHLORIDE CRYS ER 20 MEQ PO TBCR
40.0000 meq | EXTENDED_RELEASE_TABLET | Freq: Once | ORAL | Status: AC
  Administered 2014-12-21: 40 meq via ORAL
  Filled 2014-12-21: qty 2

## 2014-12-21 NOTE — ED Notes (Signed)
Pt. Reports she kicked a box with her R foot causing injury to her pinky toe and R mid arch.  Pt also reports hitting her L hand on a table injuring her L knuckle causing pain.

## 2014-12-21 NOTE — ED Notes (Signed)
Pt c/o rt foot pain after kicking a box and left hand pain after hitting it ice applied to foot,  Pt is ambulatory

## 2014-12-21 NOTE — ED Provider Notes (Signed)
CSN: [id number]     Arrival date & time 12/21/14  2038 History  This chart was scribed for Debby Freiberg, MD by Meriel Pica, ED Scribe. This patient was seen in room MH01/MH01 and the patient's care was started 9:00 PM.   Chief Complaint  Patient presents with  . Foot Pain   Patient is a 59 y.o. female presenting with lower extremity pain and hand pain. The history is provided by the patient. No language interpreter was used.  Foot Pain This is a new problem. The current episode started 3 to 5 hours ago. The problem occurs constantly. The problem has not changed since onset.The symptoms are aggravated by walking. Nothing relieves the symptoms. She has tried nothing for the symptoms. The treatment provided no relief.  Hand Pain This is a new problem. The current episode started 3 to 5 hours ago. The problem occurs constantly. The problem has not changed since onset.Nothing aggravates the symptoms. Nothing relieves the symptoms. She has tried nothing for the symptoms. The treatment provided no relief.   HPI Comments: Alyia Lacerte is a 59 y.o. female who presents to the Emergency Department complaining of sudden onset, constant, moderate right foot pain in 5th toe and mid arch s/p kicking a box 5.5 hours ago. She additionally reports pain across her left knuckles that radiates to her wrist after hitting her hand on a table. Her right foot pain is exacerbated with weight bearing. Denies LOC or syncope.   Past Medical History  Diagnosis Date  . Back pain, chronic   . Cecal bascule   . Osteoarthritis   . Cancer     skin  . Anxiety    Past Surgical History  Procedure Laterality Date  . Wrist surgery  2009    right wrist  . Elbow surgery      left elbow  . Ovarian cyst removal      right  . Skin cancer excision      multiple-SCC and BCC  . Uterine ablation    . Tubal ligation    . Cholecystectomy    . Colon surgery  02/20/2010    Right hemicolectomy-cecal bascule  . Joint  replacement  2006    Left total knee replacement  . Breast surgery  August 2011    Implants removed  . Ablation saphenous vein w/ rfa     Family History  Problem Relation Age of Onset  . Cancer Father     stomach cancer  . Cancer Brother     pancreatic cancer  . Stroke Maternal Grandmother   . Cancer Maternal Grandfather     lung cancer  . Cancer Paternal Grandmother     ovarian  . Heart disease Paternal Grandfather    Social History  Substance Use Topics  . Smoking status: Never Smoker   . Smokeless tobacco: Never Used  . Alcohol Use: No   OB History    No data available     Review of Systems  Musculoskeletal: Positive for arthralgias ( right ankle, left hand/wrist).  Neurological: Negative for syncope.  All other systems reviewed and are negative.  Allergies  Sulfa antibiotics and Doxycycline  Home Medications   Prior to Admission medications   Medication Sig Start Date End Date Taking? Authorizing Provider  cephALEXin (KEFLEX) 500 MG capsule Take 1 capsule (500 mg total) by mouth 4 (four) times daily. 11/11/14   Debby Freiberg, MD  cloNIDine (CATAPRES) 0.2 MG tablet take 1 tablet by mouth three times a  day 10/16/14   Elby Beck, FNP  HYDROcodone-acetaminophen (NORCO/VICODIN) 5-325 MG per tablet Take 1-2 tablets by mouth every 4 (four) hours as needed. 11/11/14   Debby Freiberg, MD  LORazepam (ATIVAN) 1 MG tablet take 1 tablet by mouth every 6 hours if needed for anxiety 11/24/14   Darlyne Russian, MD  mometasone (NASONEX) 50 MCG/ACT nasal spray Place 2 sprays into the nose daily as needed (allergies).     Historical Provider, MD  ondansetron (ZOFRAN-ODT) 8 MG disintegrating tablet dissolve 1 tablet ON TONGUE every 8 hours if needed 10/06/14   Darlyne Russian, MD  oxyCODONE-acetaminophen (PERCOCET/ROXICET) 5-325 MG per tablet Take 1-2 tablets by mouth every 4 (four) hours as needed. 12/21/14   Debby Freiberg, MD  polyethylene glycol [facility] Floria Raveling) packet Take 17 g  by mouth daily as needed.     Historical Provider, MD  potassium chloride SA (K-DUR,KLOR-CON) 20 MEQ tablet Take 2 tablets (40 mEq total) by mouth daily. 04/14/14   Elby Beck, FNP  sertraline (ZOLOFT) 100 MG tablet take 1 and 1/2 tablets by mouth once daily NEEDS OFFICE VISIT FOR ADDITIONAL REFILLS 10/16/14   Elby Beck, FNP  Vitamin D, Ergocalciferol, (DRISDOL) 50000 UNITS CAPS capsule TAKE 1 CAPSULE BY MOUTH EVERY 7 DAYS 03/20/14   Chelle Jeffery, PA-C   BP 123/74 mmHg  Pulse 86  Temp(Src) 99.3 F (37.4 C) (Oral)  Resp 16  Ht 5\' 7"  (1.702 m)  Wt 135 lb (61.236 kg)  BMI 21.14 kg/m2  SpO2 98% Physical Exam  Constitutional: She is oriented to person, place, and time. She appears well-developed and well-nourished.  HENT:  Head: Normocephalic and atraumatic.  Right Ear: External ear normal.  Left Ear: External ear normal.  Eyes: Conjunctivae and EOM are normal. Pupils are equal, round, and reactive to light.  Neck: Normal range of motion. Neck supple.  Cardiovascular: Normal rate, regular rhythm, normal heart sounds and intact distal pulses.   Pulmonary/Chest: Effort normal and breath sounds normal.  Abdominal: Soft. Bowel sounds are normal. There is no tenderness.  Musculoskeletal: Normal range of motion.       Left elbow: Normal.       Left wrist: She exhibits tenderness and bony tenderness.       Right ankle: Normal.       Left forearm: Normal.       Right foot: There is tenderness and bony tenderness (R midfoot entirety). There is normal range of motion.  Neurological: She is alert and oriented to person, place, and time.  Skin: Skin is warm and dry.  Vitals reviewed.   ED Course  Procedures  DIAGNOSTIC STUDIES: Oxygen Saturation is 98% on RA, normal by my interpretation.    COORDINATION OF CARE: 9:31 PM Discussed treatment plan which includes to order Xray of right foot, left wrist, and left hand. Pt acknowledges and agrees to plan.   Labs Review Labs  Reviewed  BASIC METABOLIC PANEL - Abnormal; Notable for the following:    Potassium 2.9 (*)    CO2 21 (*)    Glucose, Bld 113 (*)    Anion gap 16 (*)    All other components within normal limits    Imaging Review Dg Wrist Complete Left  12/21/2014   CLINICAL DATA:  Left hand injury.  Initial encounter.  EXAM: LEFT WRIST - COMPLETE 3+ VIEW  COMPARISON:  None.  FINDINGS: Punctate debris at the fifth MCP joint correlates with debris in a laceration on radiography  11/10/2014.  No acute soft tissue findings.  No acute fracture or dislocation.  IMPRESSION: No acute finding.   Electronically Signed   By: Monte Fantasia M.D.   On: 12/21/2014 22:24   Dg Hand Complete Left  12/21/2014   CLINICAL DATA:  Left hand injury on-table with left knuckle pain. Initial encounter.  EXAM: LEFT HAND - COMPLETE 3+ VIEW  COMPARISON:  11/10/2014  FINDINGS: Chronic punctate debris about the fifth MCP joint correlating with debris containing laceration on the prior study. Mild swelling still present at this level.  No acute soft tissue finding.  No acute fracture or dislocation.  IMPRESSION: No acute finding.   Electronically Signed   By: Monte Fantasia M.D.   On: 12/21/2014 22:26   Dg Foot Complete Right  12/21/2014   CLINICAL DATA:  Pt. Reports she kicked a box with her R foot causing injury to her pinky toe and R mid arch. Pt also reports hitting her L hand on a table injuring her L knuckle causing pain.  EXAM: RIGHT FOOT COMPLETE - 3+ VIEW  COMPARISON:  Right toe radiographs, 01/31/2014  FINDINGS: There is an oblique fracture of the proximal phalanx of the right fifth toe. This extends from the mid to the distal shaft. It does not involve the articular surfaces. It is nondisplaced with only mild lateral angulation.  There is also a fracture along the medial base of the distal phalanx. This involves the medial margin of the DIP joint articular surface. Fracture is nondisplaced.  There is a small fragment of bone along the  lateral corner of the base of the middle phalanx consistent with an additional fracture, without significant displacement. None of these findings were present on the prior exam.  No other evidence of a fracture. Joints are normally aligned. There is fifth toe soft tissue swelling.  IMPRESSION: 1. Fractures of the fifth toe. The dominant fracture is an oblique fracture of the shaft of the proximal phalanx. There is a small corner fracture from the medial base of the distal phalanx and another small corner fracture from the lateral base of the middle phalanx. Fractures are nondisplaced.   Electronically Signed   By: Lajean Manes M.D.   On: 12/21/2014 22:25      EKG Interpretation None      MDM   Final diagnoses:  Phalanx fracture, foot, right, closed, initial encounter   59 y.o. female presents with R foot and L wrist pain after tripping and kicking a box and hitting it with her hand as well.  Exam as above on arrival, consistent with history, no obvious contusion, however injury was just PTA.  Pt also endorsed some tingling in arms which is similar to prior hypokalemia.  Wu obtained as above and with hypokalemia and with R phalanx fracture of toe.  Placed in post op shoe, PO potassium replacement given.  DC home in stable condition.    I have reviewed all laboratory and imaging studies if ordered as above  1. Phalanx fracture, foot, right, closed, initial encounter          Debby Freiberg, MD 12/21/14 2328

## 2014-12-21 NOTE — Discharge Instructions (Signed)

## 2015-01-06 ENCOUNTER — Other Ambulatory Visit: Payer: Self-pay | Admitting: Family Medicine

## 2015-01-07 ENCOUNTER — Encounter: Payer: Self-pay | Admitting: Emergency Medicine

## 2015-01-07 ENCOUNTER — Ambulatory Visit (INDEPENDENT_AMBULATORY_CARE_PROVIDER_SITE_OTHER): Payer: BLUE CROSS/BLUE SHIELD | Admitting: Emergency Medicine

## 2015-01-07 VITALS — BP 122/74 | HR 74 | Temp 98.8°F | Resp 16 | Ht 67.0 in

## 2015-01-07 DIAGNOSIS — E876 Hypokalemia: Secondary | ICD-10-CM

## 2015-01-07 DIAGNOSIS — E559 Vitamin D deficiency, unspecified: Secondary | ICD-10-CM

## 2015-01-07 DIAGNOSIS — E041 Nontoxic single thyroid nodule: Secondary | ICD-10-CM

## 2015-01-07 DIAGNOSIS — E785 Hyperlipidemia, unspecified: Secondary | ICD-10-CM | POA: Diagnosis not present

## 2015-01-07 DIAGNOSIS — F411 Generalized anxiety disorder: Secondary | ICD-10-CM | POA: Diagnosis not present

## 2015-01-07 DIAGNOSIS — F552 Abuse of laxatives: Secondary | ICD-10-CM | POA: Diagnosis not present

## 2015-01-07 LAB — BASIC METABOLIC PANEL WITH GFR
BUN: 8 mg/dL (ref 7–25)
CALCIUM: 8.6 mg/dL (ref 8.6–10.4)
CO2: 25 mmol/L (ref 20–31)
Chloride: 103 mmol/L (ref 98–110)
Creat: 0.69 mg/dL (ref 0.50–1.05)
GFR, Est Non African American: >89 mL/min (ref >=60)
Glucose, Bld: 98 mg/dL (ref 65–99)
Potassium: 3.3 mmol/L — ABNORMAL LOW (ref 3.5–5.3)
Sodium: 140 mmol/L (ref 135–146)

## 2015-01-07 LAB — CBC WITH DIFFERENTIAL/PLATELET
BASOS PCT: 0 % (ref 0–1)
Basophils Absolute: 0 10*3/uL (ref 0.0–0.1)
EOS ABS: 0.3 10*3/uL (ref 0.0–0.7)
Eosinophils Relative: 3 % (ref 0–5)
HCT: 39.1 % (ref 36.0–46.0)
HEMOGLOBIN: 13.3 g/dL (ref 12.0–15.0)
LYMPHS ABS: 4.4 10*3/uL — AB (ref 0.7–4.0)
Lymphocytes Relative: 44 % (ref 12–46)
MCH: 31.3 pg (ref 26.0–34.0)
MCHC: 34 g/dL (ref 30.0–36.0)
MCV: 92 fL (ref 78.0–100.0)
MONOS PCT: 9 % (ref 3–12)
MPV: 10 fL (ref 8.6–12.4)
Monocytes Absolute: 0.9 10*3/uL (ref 0.1–1.0)
NEUTROS PCT: 44 % (ref 43–77)
Neutro Abs: 4.4 10*3/uL (ref 1.7–7.7)
Platelets: 255 10*3/uL (ref 150–400)
RBC: 4.25 MIL/uL (ref 3.87–5.11)
RDW: 13.6 % (ref 11.5–15.5)
WBC: 9.9 10*3/uL (ref 4.0–10.5)

## 2015-01-07 LAB — LIPID PANEL
CHOL/HDL RATIO: 2.9 ratio (ref <=5.0)
Cholesterol: 231 mg/dL — ABNORMAL HIGH (ref 125–200)
HDL: 80 mg/dL (ref >=46)
LDL Cholesterol: 134 mg/dL — ABNORMAL HIGH (ref <130)
Triglycerides: 83 mg/dL (ref <150)
VLDL: 17 mg/dL (ref <30)

## 2015-01-07 LAB — VITAMIN D 25 HYDROXY (VIT D DEFICIENCY, FRACTURES): Vit D, 25-Hydroxy: 30 ng/mL (ref 30–100)

## 2015-01-07 NOTE — Progress Notes (Addendum)
Patient ID: Beth Frey, female   DOB: Nov 30, 1955, 59 y.o.   MRN: [id number]     This chart was scribed for Arlyss Queen, MD by Zola Button, Medical Scribe. This patient was seen in room 28 and the patient's care was started at 11:30 AM.   Chief Complaint:  Chief Complaint  Patient presents with  . Anxiety  . Thyroid Nodule  . Osteoporosis    HPI: Beth Frey is a 59 y.o. female with a history of anxiety, hyperlipidemia and chronic hypokalemia who reports to [facility] today for a follow-up. She has stopped drinking diet drinks and primarily drinks water now. She has also been eating salads. She has been taking her potassium.   She declines flu shot today and typically does not receive them.  Patient recently put her mother, who has peripheral neuropathy, in assisted living at [facility].  Past Medical History  Diagnosis Date  . Back pain, chronic   . Cecal bascule   . Osteoarthritis   . Cancer     skin  . Anxiety    Past Surgical History  Procedure Laterality Date  . Wrist surgery  2009    right wrist  . Elbow surgery      left elbow  . Ovarian cyst removal      right  . Skin cancer excision      multiple-SCC and BCC  . Uterine ablation    . Tubal ligation    . Cholecystectomy    . Colon surgery  02/20/2010    Right hemicolectomy-cecal bascule  . Joint replacement  2006    Left total knee replacement  . Breast surgery  August 2011    Implants removed  . Ablation saphenous vein w/ rfa     Social History   Social History  . Marital Status: Married    Spouse Name: N/A  . Number of Children: N/A  . Years of Education: N/A   Social History Main Topics  . Smoking status: Never Smoker   . Smokeless tobacco: Never Used  . Alcohol Use: No  . Drug Use: No  . Sexual Activity: Not Asked     Comment: married   Other Topics Concern  . None   Social History Narrative   Family History  Problem Relation Age of Onset  . Cancer Father     stomach cancer  . Cancer  Brother     pancreatic cancer  . Stroke Maternal Grandmother   . Cancer Maternal Grandfather     lung cancer  . Cancer Paternal Grandmother     ovarian  . Heart disease Paternal Grandfather    Allergies  Allergen Reactions  . Sulfa Antibiotics Itching and Swelling    Throat, body swelling  . Doxycycline     Sun    Prior to Admission medications   Medication Sig Start Date End Date Taking? Authorizing Provider  cloNIDine (CATAPRES) 0.2 MG tablet take 1 tablet by mouth three times a day 10/16/14  Yes Elby Beck, FNP  LORazepam (ATIVAN) 1 MG tablet take 1 tablet by mouth every 6 hours if needed for anxiety 11/24/14  Yes Darlyne Russian, MD  mometasone (NASONEX) 50 MCG/ACT nasal spray Place 2 sprays into the nose daily as needed (allergies).    Yes Historical Provider, MD  ondansetron (ZOFRAN-ODT) 8 MG disintegrating tablet dissolve 1 tablet ON TONGUE every 8 hours if needed 10/06/14  Yes Darlyne Russian, MD  polyethylene glycol (MIRALAX / GLYCOLAX) packet Take 17 g  by mouth daily as needed.    Yes Historical Provider, MD  potassium chloride SA (K-DUR,KLOR-CON) 20 MEQ tablet Take 2 tablets (40 mEq total) by mouth daily. 04/14/14  Yes Elby Beck, FNP  sertraline (ZOLOFT) 100 MG tablet take 1 and 1/2 tablets by mouth once daily NEEDS OFFICE VISIT FOR ADDITIONAL REFILLS 10/16/14  Yes Elby Beck, FNP  Vitamin D, Ergocalciferol, (DRISDOL) 50000 UNITS CAPS capsule TAKE 1 CAPSULE BY MOUTH EVERY 7 DAYS 03/20/14  Yes Chelle Jeffery, PA-C     ROS: The patient denies fevers, chills, night sweats, unintentional weight loss, chest pain, palpitations, wheezing, dyspnea on exertion, nausea, vomiting, abdominal pain, dysuria, hematuria, melena, numbness, weakness, or tingling.   All other systems have been reviewed and were otherwise negative with the exception of those mentioned in the HPI and as above.    PHYSICAL EXAM: Filed Vitals:   01/07/15 1107  BP: 122/74  Pulse: 74  Temp: 98.8  F (37.1 C)  Resp: 16   There is no weight on file to calculate BMI.   General: Alert, no acute distress HEENT:  Normocephalic, atraumatic, oropharynx patent. Eye: [facility] Cardiovascular:  Regular rate and rhythm, no rubs murmurs or gallops.  No Carotid bruits, radial pulse intact. No pedal edema. Repeat blood pressure: 120/70. Respiratory: Clear to auscultation bilaterally.  No wheezes, rales, or rhonchi.  No cyanosis, no use of accessory musculature Abdominal: No organomegaly, abdomen is soft and non-tender, positive bowel sounds.  No masses. Musculoskeletal: Gait intact. No edema, tenderness Skin: No rashes. Neurologic: Facial musculature symmetric. Psychiatric: Patient acts appropriately throughout our interaction. Lymphatic: No cervical or submandibular lymphadenopathy Genitourinary/Anorectal: No acute findings    LABS:    EKG/XRAY:   Primary read interpreted by Dr. Everlene Farrier at [facility].   ASSESSMENT/PLAN:   Patient was scheduled for thyroid biopsy but due to family issues when able to have this done. We'll check her routine labs today and she is agreeable to reschedule her thyroid biopsy. Labs were done today. She states she is not drinking. She states she is doing better with her eating disorder.  Gross sideeffects, risk and benefits, and alternatives of medications d/w patient. Patient is aware that all medications have potential sideeffects and we are unable to predict every sideeffect or drug-drug interaction that may occur.  Arlyss Queen MD 01/07/2015 11:37 AM

## 2015-01-18 ENCOUNTER — Encounter: Payer: Self-pay | Admitting: Family Medicine

## 2015-01-18 ENCOUNTER — Telehealth: Payer: Self-pay

## 2015-01-18 NOTE — Telephone Encounter (Signed)
Pt called back. Does not want to start a statin

## 2015-02-09 ENCOUNTER — Encounter: Payer: Self-pay | Admitting: Emergency Medicine

## 2015-02-11 ENCOUNTER — Other Ambulatory Visit: Payer: Self-pay | Admitting: Emergency Medicine

## 2015-02-24 ENCOUNTER — Other Ambulatory Visit: Payer: Self-pay | Admitting: Family Medicine

## 2015-03-04 ENCOUNTER — Other Ambulatory Visit: Payer: Self-pay | Admitting: Physician Assistant

## 2015-03-05 NOTE — Telephone Encounter (Signed)
I looked like we had trouble reaching pt after lab results were in, and I wanted to make sure pt was aware of inc in K+ dosage and need for return for lab. Reached pt and she did get a copy of her labs and had contacted the office. Pt verified that she is taking 3 K+ tabs QD now (had been on 2 QD and Chelle instr'd to take an extra one per day). I sent in RF w/new sig for pt and she agreed to RTC for lab when she returns from trip (back next week). Chelle, [place] to order lab for lab only visit, or do you need to see the pt?

## 2015-03-07 NOTE — Telephone Encounter (Signed)
Patient was to come in for 1 month recheck of potassium one month after her last visit on 9/01.  This is actually Dr. Perfecto Kingdom patient, and I believe that he would like to see her back, rather than just Lab visit.  Meds ordered this encounter  Medications  . potassium chloride SA (K-DUR,KLOR-CON) 20 MEQ tablet    Sig: Take 3 tablets (60 mEq total) by mouth daily.    Dispense:  90 tablet    Refill:  0

## 2015-03-08 NOTE — Telephone Encounter (Signed)
LMOM for pt that she will need to RTC Dr Everlene Farrier for OV f/up upon his return after the 9th.

## 2015-03-24 ENCOUNTER — Ambulatory Visit: Payer: BLUE CROSS/BLUE SHIELD | Admitting: Family Medicine

## 2015-04-01 ENCOUNTER — Other Ambulatory Visit: Payer: Self-pay | Admitting: Family Medicine

## 2015-04-12 ENCOUNTER — Encounter: Payer: Self-pay | Admitting: Family Medicine

## 2015-04-12 ENCOUNTER — Ambulatory Visit (INDEPENDENT_AMBULATORY_CARE_PROVIDER_SITE_OTHER): Payer: BLUE CROSS/BLUE SHIELD | Admitting: Family Medicine

## 2015-04-12 VITALS — BP 121/78 | HR 83 | Temp 98.4°F | Resp 16 | Ht 67.0 in | Wt 143.0 lb

## 2015-04-12 DIAGNOSIS — F32A Depression, unspecified: Secondary | ICD-10-CM

## 2015-04-12 DIAGNOSIS — E876 Hypokalemia: Secondary | ICD-10-CM

## 2015-04-12 DIAGNOSIS — F411 Generalized anxiety disorder: Secondary | ICD-10-CM | POA: Diagnosis not present

## 2015-04-12 DIAGNOSIS — F329 Major depressive disorder, single episode, unspecified: Secondary | ICD-10-CM | POA: Diagnosis not present

## 2015-04-12 DIAGNOSIS — I10 Essential (primary) hypertension: Secondary | ICD-10-CM

## 2015-04-12 DIAGNOSIS — R112 Nausea with vomiting, unspecified: Secondary | ICD-10-CM | POA: Diagnosis not present

## 2015-04-12 LAB — BASIC METABOLIC PANEL
BUN: 17 mg/dL (ref 7–25)
CALCIUM: 9.7 mg/dL (ref 8.6–10.4)
CO2: 26 mmol/L (ref 20–31)
Chloride: 100 mmol/L (ref 98–110)
Creat: 0.79 mg/dL (ref 0.50–1.05)
GLUCOSE: 98 mg/dL (ref 65–99)
Potassium: 3.7 mmol/L (ref 3.5–5.3)
Sodium: 136 mmol/L (ref 135–146)

## 2015-04-12 MED ORDER — LORAZEPAM 1 MG PO TABS: 1.0000 mg | ORAL_TABLET | ORAL | Status: DC | PRN

## 2015-04-12 MED ORDER — POTASSIUM CHLORIDE CRYS ER 20 MEQ PO TBCR: 60.0000 meq | EXTENDED_RELEASE_TABLET | Freq: Every day | ORAL | Status: DC

## 2015-04-12 MED ORDER — ONDANSETRON 8 MG PO TBDP: ORAL_TABLET | ORAL | Status: DC

## 2015-04-12 MED ORDER — SERTRALINE HCL 100 MG PO TABS: 100.0000 mg | ORAL_TABLET | Freq: Every day | ORAL | Status: DC

## 2015-04-12 MED ORDER — CLONIDINE HCL 0.2 MG PO TABS: ORAL_TABLET | ORAL | Status: DC

## 2015-04-12 NOTE — Progress Notes (Signed)
Subjective:    Patient ID: Beth Frey, female    DOB: April 28, 1956, 59 y.o.   MRN: [id number]  HPI This is a pleasant 59 yo female patient of Dr. Everlene Farrier, who presents today for medication refill and follow up of hypokalemia, hyperlipidemia, anxiety disorder, nausea.   She has been out of her potassium for several days and feels like her potassium is getting low.  She had lipid panel 01/07/15 which showed improved lipids with HDL increasing from 47 to 80. Patient reports taking red grapefruit extract and doing more physical work prior to lipid recheck.   She has had a stressful year trying to settle a deceased friend's estate, selling her mother's house and getting her mother settled into [place]. She feels like she is doing well considering everything that is going on. She was seen at [facility] and started on Latuda. She did not find it helpful and stopped it. She did not go for follow up.   She continues to have frequent nausea without vomiting and requests ondansetron refill. No vomiting. She denies constipation.   Review of Systems Per HPI    Objective:   Physical Exam Physical Exam  Constitutional: Oriented to person, place, and time. She appears well-developed and well-nourished.  HENT:  Head: Normocephalic and atraumatic.  Eyes: Conjunctivae are normal.  Neck: Normal range of motion. Neck supple.  Cardiovascular: Normal rate, regular rhythm and normal heart sounds.   Pulmonary/Chest: Effort normal and breath sounds normal.  Musculoskeletal: Normal range of motion.  Neurological: Alert and oriented to person, place, and time.  Skin: Skin is warm and dry.  Psychiatric: Normal mood and affect. Behavior is normal. Judgment and thought content normal.  Vitals reviewed. BP 121/78 mmHg  Pulse 83  Temp(Src) 98.4 F (36.9 C)  Resp 16  Ht 5\' 7"  (1.702 m)  Wt 143 lb (64.864 kg)  BMI 22.39 kg/m2 Wt Readings from Last 3 Encounters:  04/12/15 143 lb (64.864 kg)  12/21/14 135 lb (61.236  kg)  11/10/14 135 lb (61.236 kg)   Depression screen [facility] 2/9 04/12/2015 01/07/2015 10/06/2014 07/28/2014  Decreased Interest 0 0 0 0  Down, Depressed, Hopeless 0 0 0 1  PHQ - 2 Score 0 0 0 1       Assessment & Plan:  1. Non-intractable vomiting with nausea, vomiting of unspecified type - ondansetron (ZOFRAN-ODT) 8 MG disintegrating tablet; dissolve 1 tablet ON TONGUE every 8 hours if needed  Dispense: 30 tablet; Refill: 4  2. Generalized anxiety disorder - cloNIDine (CATAPRES) 0.2 MG tablet; TAKE 1 TABLET BY MOUTH THREE TIMES A DAY  Dispense: 180 tablet; Refill: 1 - LORazepam (ATIVAN) 1 MG tablet; Take 1 tablet (1 mg total) by mouth every 4 (four) hours as needed for anxiety.  Dispense: 120 tablet; Refill: 5- discussed concern with long term benzo use and encouraged patient to use smallest dose possible with thoughts of eventually decreasing dependence.  - sertraline (ZOLOFT) 100 MG tablet; Take 1 tablet (100 mg total) by mouth daily.  Dispense: 90 tablet; Refill: 1  3. Hypokalemia - potassium chloride SA (K-DUR,KLOR-CON) 20 MEQ tablet; Take 3 tablets (60 mEq total) by mouth daily.  Dispense: 180 tablet; Refill: 1 - Basic metabolic panel  4. Essential hypertension, benign - cloNIDine (CATAPRES) 0.2 MG tablet; TAKE 1 TABLET BY MOUTH THREE TIMES A DAY  Dispense: 180 tablet; Refill: 1  5. Depression - sertraline (ZOLOFT) 100 MG tablet; Take 1 tablet (100 mg total) by mouth daily.  Dispense: 90 tablet;  Refill: 1  - follow up with Dr. Everlene Farrier in 6 months  Clarene Reamer, FNP-BC  [facility] [place]  04/13/2015 10:26 PM

## 2015-04-12 NOTE — Patient Instructions (Signed)
Please add Vitamin D3 2,000 IU daily

## 2015-05-20 ENCOUNTER — Encounter (HOSPITAL_COMMUNITY): Payer: Self-pay

## 2015-05-20 ENCOUNTER — Emergency Department (HOSPITAL_COMMUNITY)
Admission: EM | Admit: 2015-05-20 | Discharge: 2015-05-20 | Disposition: A | Discharge status: Home / Self Care | Payer: BLUE CROSS/BLUE SHIELD | Attending: Emergency Medicine | Admitting: Emergency Medicine

## 2015-05-20 DIAGNOSIS — W268XXA Contact with other sharp object(s), not elsewhere classified, initial encounter: Secondary | ICD-10-CM | POA: Insufficient documentation

## 2015-05-20 DIAGNOSIS — S81802A Unspecified open wound, left lower leg, initial encounter: Secondary | ICD-10-CM

## 2015-05-20 DIAGNOSIS — G8929 Other chronic pain: Secondary | ICD-10-CM | POA: Diagnosis not present

## 2015-05-20 DIAGNOSIS — Z8719 Personal history of other diseases of the digestive system: Secondary | ICD-10-CM | POA: Insufficient documentation

## 2015-05-20 DIAGNOSIS — Y998 Other external cause status: Secondary | ICD-10-CM | POA: Insufficient documentation

## 2015-05-20 DIAGNOSIS — F419 Anxiety disorder, unspecified: Secondary | ICD-10-CM | POA: Diagnosis not present

## 2015-05-20 DIAGNOSIS — Z8739 Personal history of other diseases of the musculoskeletal system and connective tissue: Secondary | ICD-10-CM | POA: Insufficient documentation

## 2015-05-20 DIAGNOSIS — Y9289 Other specified places as the place of occurrence of the external cause: Secondary | ICD-10-CM | POA: Diagnosis not present

## 2015-05-20 DIAGNOSIS — Y9389 Activity, other specified: Secondary | ICD-10-CM | POA: Insufficient documentation

## 2015-05-20 DIAGNOSIS — Z79899 Other long term (current) drug therapy: Secondary | ICD-10-CM | POA: Insufficient documentation

## 2015-05-20 DIAGNOSIS — Z85828 Personal history of other malignant neoplasm of skin: Secondary | ICD-10-CM | POA: Insufficient documentation

## 2015-05-20 MED ORDER — LIDOCAINE-EPINEPHRINE (PF) 2 %-1:200000 IJ SOLN
20.0000 mL | Freq: Once | INTRAMUSCULAR | Status: AC
  Administered 2015-05-20: 20 mL
  Filled 2015-05-20: qty 20

## 2015-05-20 MED ORDER — ONDANSETRON 8 MG PO TBDP
8.0000 mg | ORAL_TABLET | Freq: Once | ORAL | Status: AC
  Administered 2015-05-20: 8 mg via ORAL
  Filled 2015-05-20: qty 1

## 2015-05-20 MED ORDER — OXYCODONE HCL 5 MG PO TABS: 5.0000 mg | ORAL_TABLET | ORAL | Status: DC | PRN

## 2015-05-20 MED ORDER — CEPHALEXIN 500 MG PO CAPS: 500.0000 mg | ORAL_CAPSULE | Freq: Four times a day (QID) | ORAL | Status: DC

## 2015-05-20 MED ORDER — FENTANYL CITRATE (PF) 100 MCG/2ML IJ SOLN
50.0000 ug | Freq: Once | INTRAMUSCULAR | Status: AC
  Administered 2015-05-20: 50 ug via INTRAMUSCULAR
  Filled 2015-05-20: qty 2

## 2015-05-20 NOTE — Discharge Instructions (Signed)
1. Medications: Tylenol or ibuprofen for pain, usual home medications 2. Treatment: ice for swelling, keep wound clean with warm soap and water and keep bandage dry, do not submerge in water for 24 hours 3. Follow Up: Please see your PCP in 3 days for wound check or sooner if you have concerns. Return to the emergency department for increased redness, drainage of pus from the wound   WOUND CARE  Keep area clean and dry for 24 hours. Do not remove bandage, if applied.  After 24 hours, remove bandage and wash wound gently with mild soap and warm water. Reapply a new bandage after cleaning wound, if directed.   Continue daily cleansing with soap and water until healed.  Return if you experience any of the following signs of infection: Swelling, redness, pus drainage, streaking, fever >101.0 F  Return if you experience excessive bleeding that does not stop after 15-20 minutes of constant, firm pressure.    Deep Skin Avulsion A deep skin avulsion is a type of open wound. It often results from a severe injury (trauma) that tears away all layers of the skin or an entire body part. The areas of the body that are most often affected by a deep skin avulsion include the face, lips, ears, nose, and fingers. A deep skin avulsion may make structures below the skin become visible. You may be able to see muscle, bone, nerves, and blood vessels. A deep skin avulsion can also damage important structures beneath the skin. These include tendons, ligaments, nerves, or blood vessels. CAUSES Injuries that often cause a deep skin avulsion include:  Being crushed.  Falling against a jagged surface.  Animal bites.  Gunshot wounds.  Severe burns.  Injuries that involve being dragged, such as bicycle or motorcycle accidents. SYMPTOMS Symptoms of a deep skin avulsion include:  Pain.  Numbness.  Swelling.  A misshapen body part.  Bleeding, which may be heavy.  Fluid leaking from the  wound. DIAGNOSIS This condition may be diagnosed with a medical history and physical exam. You may also have X-rays done. TREATMENT The treatment that is chosen for a deep skin avulsion depends on how large and deep the wound is and where it is located. Treatment for all types of avulsions usually starts with:  Controlling the bleeding.  Washing out the wound with a germ-free (sterile) salt-water solution.  Removing dead tissue from the wound. A wound may be closed or left open to heal. This depends on the size and location of the wound and whether it is likely to become infected. Wounds are usually covered or closed if they expose blood vessels, nerves, bone, or cartilage.  Wounds that are small and clean may be closed with stitches (sutures).  Wounds that cannot be closed with sutures may be covered with a piece of skin (graft) or a skin flap. Skin may be taken from on or near the wound, from another part of the body, or from a donor.  Wounds may be left open if they are hard to close or they may become infected. These wounds heal over time from the bottom up. You may also receive medicine. This may include:  Antibiotics.  A tetanus shot.  Rabies vaccine. HOME CARE INSTRUCTIONS Medicines  Take or apply over-the-counter and prescription medicines only as told by your health care provider.  If you were prescribed an antibiotic, take or apply it as told by your health care provider. Do not stop taking the antibiotic even if your condition  improves.  You may get anti-itch medicine while your wound is healing. Use it only as told by your health care provider. Wound Care  There are many ways to close and cover a wound. For example, a wound can be covered with sutures, skin glue, or adhesive strips. Follow instructions from your health care provider about:  How to take care of your wound.  When and how you should change your bandage (dressing).  When you should remove your  dressing.  Removing whatever was used to close your wound.  Keep the dressing dry as told by your health care provider. Do not take baths, swim, use a hot tub, or do anything that would put your wound underwater until your health care provider approves.  Clean the wound each day or as told by your health care provider.  Wash the wound with mild soap and water.  Rinse the wound with water to remove all soap.  Pat the wound dry with a clean towel. Do not rub it.  Do not scratch or pick at the wound.  Check your wound every day for signs of infection. Watch for:  Redness, swelling, or pain.  Fluid, blood, or pus. General Instructions  Raise (elevate) the injured area above the level of your heart while you are sitting or lying down.  Keep all follow-up visits as told by your health care provider. This is important. SEEK MEDICAL CARE IF:  You received a tetanus shot and you have swelling, severe pain, redness, or bleeding at the injection site.  You have a fever.  Your pain is not controlled with medicine.  You have increased redness, swelling, or pain at the site of your wound.  You have fluid, blood, or pus coming from your wound.  You notice a bad smell coming from your wound or your dressing.  A wound that was closed breaks open.  You notice something coming out of the wound, such as wood or glass.  You notice a change in the color of your skin near your wound.  You develop a new rash.  You need to change the dressing frequently due to fluid, blood, or pus draining from the wound. SEEK IMMEDIATE MEDICAL CARE IF:  Your pain suddenly increases and is severe.  You develop severe swelling around the wound.  You develop numbness around the wound.  You have nausea and vomiting that does not go away after 24 hours.  You feel light-headed, weak, or faint.  You develop chest pain.  You have trouble breathing.  Your wound is on your hand or foot and you cannot  properly move a finger or toe.  The wound is on your hand or foot and you notice that your fingers or toes look pale or bluish.  You have a red streak going away from your wound.   This information is not intended to replace advice given to you by your health care provider. Make sure you discuss any questions you have with your health care provider.   Document Released: 06/20/2006 Document Revised: 09/08/2014 Document Reviewed: 04/29/2014 Elsevier Interactive Patient Education [employer].

## 2015-05-20 NOTE — ED Provider Notes (Signed)
CSN: [id number]     Arrival date & time 05/20/15  2130 History  By signing my name below, I, Emmanuella Mensah, attest that this documentation has been prepared under the direction and in the presence of [employer], PA-C. Electronically Signed: Judithann Sauger, ED Scribe. 05/20/2015. 11:45 PM.    Chief Complaint  Patient presents with  . Extremity Laceration   The history is provided by the patient, medical records and a relative. No language interpreter was used.   HPI Comments: Beth Frey is a 60 y.o. female with a hx of skin cancer and anxiety who presents to the Emergency Department complaining of a moderately painful laceration to the left lower leg s/p injury that occurred PTA. Pt is tearful as she explains that she was cleaning behind the dryer when she cut herself on a piece of sharp metal. Pt is currently actively bleeding. No alleviating factors noted. Pt did not take any medications PTA. She reports an allergy to Doxycycline and Sulfa antibiotics. She denies any other pain.  Tetanus was updated in 2015.     Past Medical History  Diagnosis Date  . Back pain, chronic   . 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 Osteoarthritis   . Cancer ([place])     skin  . Anxiety    Past Surgical History  Procedure Laterality Date  . Wrist surgery  2009    right wrist  . Elbow surgery      left elbow  . Ovarian cyst removal      right  . Skin cancer excision      multiple-SCC and BCC  . Uterine ablation    . Tubal ligation    . Cholecystectomy    . Colon surgery  02/20/2010    Right hemicolectomy-cecal bascule  . Joint replacement  2006    Left total knee replacement  . Breast surgery  August 2011    Implants removed  . Ablation saphenous vein w/ rfa     Family History  Problem Relation Age of Onset  . Cancer Father     stomach cancer  . Cancer Brother     pancreatic cancer  . Stroke Maternal Grandmother   . Cancer Maternal Grandfather     lung cancer  . Cancer Paternal  Grandmother     ovarian  . Heart disease Paternal Grandfather    Social History  Substance Use Topics  . Smoking status: Never Smoker   . Smokeless tobacco: Never Used  . Alcohol Use: No   OB History    No data available     Review of Systems  Constitutional: Negative for fever.  Gastrointestinal: Negative for nausea and vomiting.  Musculoskeletal: Negative for myalgias and arthralgias.  Skin: Positive for color change and wound.  Allergic/Immunologic: Negative for immunocompromised state.  Neurological: Negative for weakness and numbness.  Hematological: Does not bruise/bleed easily.  Psychiatric/Behavioral: The patient is not nervous/anxious.       Allergies  Sulfa antibiotics and Doxycycline  Home Medications   Prior to Admission medications   Medication Sig Start Date End Date Taking? Authorizing Provider  cephALEXin (KEFLEX) 500 MG capsule Take 1 capsule (500 mg total) by mouth 4 (four) times daily. 05/20/15   Tryone Kille, PA-C  cloNIDine (CATAPRES) 0.2 MG tablet TAKE 1 TABLET BY MOUTH THREE TIMES A DAY 04/12/15   Elby Beck, FNP  LORazepam (ATIVAN) 1 MG tablet Take 1 tablet (1 mg total) by mouth every 4 (four) hours as needed for anxiety.  04/12/15   Elby Beck, FNP  mometasone (NASONEX) 50 MCG/ACT nasal spray Place 2 sprays into the nose daily as needed (allergies).     Historical Provider, MD  ondansetron (ZOFRAN-ODT) 8 MG disintegrating tablet dissolve 1 tablet ON TONGUE every 8 hours if needed 04/12/15   Elby Beck, FNP  oxyCODONE (ROXICODONE) 5 MG immediate release tablet Take 1 tablet (5 mg total) by mouth every 4 (four) hours as needed for severe pain. 05/20/15   Mayli Covington, PA-C  polyethylene glycol (MIRALAX / GLYCOLAX) packet Take 17 g by mouth daily as needed.     Historical Provider, MD  potassium chloride SA (K-DUR,KLOR-CON) 20 MEQ tablet Take 3 tablets (60 mEq total) by mouth daily. 04/12/15   Elby Beck, FNP   sertraline (ZOLOFT) 100 MG tablet Take 1 tablet (100 mg total) by mouth daily. 04/12/15   Elby Beck, FNP  Vitamin D, Ergocalciferol, (DRISDOL) 50000 UNITS CAPS capsule TAKE 1 CAPSULE BY MOUTH EVERY 7 DAYS 03/20/14   Chelle Jeffery, PA-C   BP 126/79 mmHg  Pulse 94  Temp(Src) 98.5 F (36.9 C) (Oral)  Resp 22  SpO2 99% Physical Exam  Constitutional: She is oriented to person, place, and time. She appears well-developed and well-nourished. No distress.  HENT:  Head: Normocephalic and atraumatic.  Eyes: Conjunctivae are normal. No scleral icterus.  Neck: Normal range of motion.  Cardiovascular: Normal rate, regular rhythm, normal heart sounds and intact distal pulses.   No murmur heard. Capillary refill < 3 sec  Pulmonary/Chest: Effort normal and breath sounds normal. No respiratory distress.  Musculoskeletal: Normal range of motion. She exhibits no edema.  ROM: FROM of the left knee and ankle  Neurological: She is alert and oriented to person, place, and time.  Sensation: intact to dull and sharp in the LLE Strength: 5/5 in the LLE  Skin: Skin is warm and dry. She is not diaphoretic.  Large and deep 4cmx5cm avulsion to the anterior central lower leg with associated smaller 3cm x1.5cm superficial abrasion just proximal to this  Psychiatric: She has a normal mood and affect.  Nursing note and vitals reviewed.   ED Course  Irrigation and debridement Date/Time: 05/20/2015 10:24 PM Performed by: Abigail Butts Authorized by: Abigail Butts Consent: Verbal consent obtained. Risks and benefits: risks, benefits and alternatives were discussed Consent given by: patient and spouse Patient understanding: patient states understanding of the procedure being performed Patient consent: the patient's understanding of the procedure matches consent given Procedure consent: procedure consent matches procedure scheduled Relevant documents: relevant documents present and  verified Site marked: the operative site was marked Required items: required blood products, implants, devices, and special equipment available Patient identity confirmed: verbally with patient and arm band Time out: Immediately prior to procedure a "time out" was called to verify the correct patient, procedure, equipment, support staff and site/side marked as required. Preparation: Patient was prepped and draped in the usual sterile fashion. Local anesthesia used: yes Anesthesia: local infiltration Local anesthetic: lidocaine 2% with epinephrine Anesthetic total: 7 ml Patient sedated: no Patient tolerance: Patient tolerated the procedure well with no immediate complications   (including critical care time) DIAGNOSTIC STUDIES: Oxygen Saturation is 99% on room, normal by my interpretation.    COORDINATION OF CARE: 9:49 PM- Pt advised of plan for treatment and pt agrees. Explained that her presentation does not warrant a laceration repair. Pt will receive a local anesthetic to control the bleeding.    MDM   Final diagnoses:  Avulsion of skin of left lower leg, initial encounter   [name] presents with large avulsion injury to the left anterior lower leg.  Pt is UTD on her tetanus.  Wound cleaned under anesthesia and dressed with Xeroform pressure dressing.  Capillary bleed noted initially, but was controlled with lidocaine injection.  Pt will be d/c home with keflex as she endorses frequent skin infections.    BP 126/79 mmHg  Pulse 94  Temp(Src) 98.5 F (36.9 C) (Oral)  Resp 22  SpO2 99%  I personally performed the services described in this documentation, which was scribed in my presence. The recorded information has been reviewed and is accurate.   Jarrett Soho Ragina Fenter, PA-C 05/20/15 2345  Davonna Belling, MD 05/21/15 [phone]

## 2015-05-23 ENCOUNTER — Telehealth: Payer: Self-pay | Admitting: Family Medicine

## 2015-05-23 NOTE — Telephone Encounter (Signed)
lmom to call and reschedule appt with daub 

## 2015-06-07 ENCOUNTER — Telehealth: Payer: Self-pay

## 2015-06-07 DIAGNOSIS — F411 Generalized anxiety disorder: Secondary | ICD-10-CM

## 2015-06-07 DIAGNOSIS — I10 Essential (primary) hypertension: Secondary | ICD-10-CM

## 2015-06-07 MED ORDER — CLONIDINE HCL 0.2 MG PO TABS: ORAL_TABLET | ORAL | Status: DC

## 2015-06-07 NOTE — Telephone Encounter (Signed)
Pt states somehow her medication got mixed up and she is out, cannot be out of her medicine or she will go into withdrawals, will be going out of town and Wednesday and cannot wait until this afternoon for her Medication, her husband is a Dr also . Please call [phone] home or her cell is [phone]. Please call when ready and it is her CLONIDINE 0.2 MG.

## 2015-06-07 NOTE — Telephone Encounter (Signed)
Rx sent.  Pt notified. 

## 2015-08-09 ENCOUNTER — Other Ambulatory Visit: Payer: Self-pay | Admitting: Family Medicine

## 2015-08-12 ENCOUNTER — Other Ambulatory Visit: Payer: Self-pay | Admitting: Family Medicine

## 2015-08-13 NOTE — Telephone Encounter (Signed)
Does patient need to RTC for refills

## 2015-09-05 ENCOUNTER — Other Ambulatory Visit (HOSPITAL_BASED_OUTPATIENT_CLINIC_OR_DEPARTMENT_OTHER): Payer: Self-pay | Admitting: Emergency Medicine

## 2015-09-05 ENCOUNTER — Ambulatory Visit (HOSPITAL_BASED_OUTPATIENT_CLINIC_OR_DEPARTMENT_OTHER)
Admission: RE | Admit: 2015-09-05 | Discharge: 2015-09-05 | Disposition: A | Discharge status: Home / Self Care | Payer: BLUE CROSS/BLUE SHIELD | Source: Ambulatory Visit | Attending: Emergency Medicine | Admitting: Emergency Medicine

## 2015-09-05 DIAGNOSIS — M21611 Bunion of right foot: Secondary | ICD-10-CM | POA: Diagnosis not present

## 2015-09-05 DIAGNOSIS — T1490XA Injury, unspecified, initial encounter: Secondary | ICD-10-CM

## 2015-09-05 DIAGNOSIS — T1490 Injury, unspecified: Secondary | ICD-10-CM | POA: Diagnosis not present

## 2015-09-05 DIAGNOSIS — M79671 Pain in right foot: Secondary | ICD-10-CM | POA: Diagnosis not present

## 2015-09-05 DIAGNOSIS — S99921A Unspecified injury of right foot, initial encounter: Secondary | ICD-10-CM | POA: Diagnosis not present

## 2015-09-08 ENCOUNTER — Encounter: Payer: Self-pay | Admitting: Family Medicine

## 2015-09-14 ENCOUNTER — Ambulatory Visit (INDEPENDENT_AMBULATORY_CARE_PROVIDER_SITE_OTHER): Payer: BLUE CROSS/BLUE SHIELD | Admitting: Emergency Medicine

## 2015-09-14 VITALS — BP 116/78 | HR 78 | Temp 98.3°F | Resp 18 | Ht 67.0 in | Wt 153.4 lb

## 2015-09-14 DIAGNOSIS — F411 Generalized anxiety disorder: Secondary | ICD-10-CM | POA: Diagnosis not present

## 2015-09-14 DIAGNOSIS — E876 Hypokalemia: Secondary | ICD-10-CM | POA: Diagnosis not present

## 2015-09-14 DIAGNOSIS — I10 Essential (primary) hypertension: Secondary | ICD-10-CM | POA: Diagnosis not present

## 2015-09-14 DIAGNOSIS — E041 Nontoxic single thyroid nodule: Secondary | ICD-10-CM

## 2015-09-14 DIAGNOSIS — Z1159 Encounter for screening for other viral diseases: Secondary | ICD-10-CM

## 2015-09-14 DIAGNOSIS — F329 Major depressive disorder, single episode, unspecified: Secondary | ICD-10-CM | POA: Diagnosis not present

## 2015-09-14 DIAGNOSIS — F32A Depression, unspecified: Secondary | ICD-10-CM

## 2015-09-14 LAB — CBC WITH DIFFERENTIAL/PLATELET
BASOS ABS: 0 {cells}/uL (ref 0–200)
Basophils Relative: 0 %
Eosinophils Absolute: 360 cells/uL (ref 15–500)
Eosinophils Relative: 4 %
HEMATOCRIT: 39.7 % (ref 35.0–45.0)
Hemoglobin: 13.3 g/dL (ref 11.7–15.5)
LYMPHS PCT: 34 %
Lymphs Abs: 3060 cells/uL (ref 850–3900)
MCH: 31.7 pg (ref 27.0–33.0)
MCHC: 33.5 g/dL (ref 32.0–36.0)
MCV: 94.7 fL (ref 80.0–100.0)
MPV: 10.7 fL (ref 7.5–12.5)
Monocytes Absolute: 900 cells/uL (ref 200–950)
Monocytes Relative: 10 %
Neutro Abs: 4680 cells/uL (ref 1500–7800)
Neutrophils Relative %: 52 %
Platelets: 284 10*3/uL (ref 140–400)
RBC: 4.19 MIL/uL (ref 3.80–5.10)
RDW: 13.7 % (ref 11.0–15.0)
WBC: 9 10*3/uL (ref 3.8–10.8)

## 2015-09-14 MED ORDER — CLONIDINE HCL 0.2 MG PO TABS: ORAL_TABLET | ORAL | Status: DC

## 2015-09-14 MED ORDER — FLUOXETINE HCL 10 MG PO TABS: ORAL_TABLET | ORAL | Status: DC

## 2015-09-14 NOTE — Patient Instructions (Addendum)
Decrease your Zoloft to one half tablet a day or 50 mg daily for 1 week then the following week decrease to every other day for one week. In 2 weeks start on Prozac initially 10 mg a day for the first 2 weeks then 20 mg a day.    IF you received an x-ray today, you will receive an invoice from [facility]. Please contact [facility] at [phone] with questions or concerns regarding your invoice.   IF you received labwork today, you will receive an invoice from [employer]. Please contact [facility] at [phone] with questions or concerns regarding your invoice.   Our billing staff will not be able to assist you with questions regarding bills from these companies.  You will be contacted with the lab results as soon as they are available. The fastest way to get your results is to activate your My Chart account. Instructions are located on the last page of this paperwork. If you have not heard from [place] regarding the results in 2 weeks, please contact this office.

## 2015-09-14 NOTE — Progress Notes (Addendum)
By signing my name below, I, Mesha Guinyard, attest that this documentation has been prepared under the direction and in the presence of Arlyss Queen, MD.  Electronically Signed: Verlee Monte, Medical Scribe. 09/14/2015. 1:49 PM.  Chief Complaint:  Chief Complaint  Patient presents with  . Medication Management    Wants to discuss clonidine and sertaline    HPI: Beth Frey is a 60 y.o. female who reports to [facility] today to change her medication. Pt mentions heavy concern for her mom's health since being diagnosed with Parkinson's, and going into assisted living. Pt reports being stressed over the process of getting her mom into assisted living. She also mentions fear of dying before her mom since her mom has already lost her children and husband. Pt reports her siblings have died around 32 years of living. Pt describes her depression as a heavy weight on her. She feels like she lost all of her freedom to meeting attorney deadlines. Her first husband died Christmas day 6 months ago, and left her in charge of his affairs as well as his estate. Pt mentions having a good marriage for about 17 years, and has quality time when they are together. Pt reports taking clonidine 4-5 QD but hasn't found relief for her depression. She denies being a big drinker, or starving herself. She mentions being disappointed in her appearance. She denies hx of manic episodes.  Pt mentions that around 2-3pm her face becomes red and her bp gets to 200's.  Past Medical History  Diagnosis Date  . Back pain, chronic   . 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 Osteoarthritis   . Cancer ([place])     skin  . Anxiety    Past Surgical History  Procedure Laterality Date  . Wrist surgery  2009    right wrist  . Elbow surgery      left elbow  . Ovarian cyst removal      right  . Skin cancer excision      multiple-SCC and BCC  . Uterine ablation    . Tubal ligation    . Cholecystectomy    . Colon surgery  02/20/2010    Right  hemicolectomy-cecal bascule  . Joint replacement  2006    Left total knee replacement  . Breast surgery  August 2011    Implants removed  . Ablation saphenous vein w/ rfa     Social History   Social History  . Marital Status: Married    Spouse Name: N/A  . Number of Children: N/A  . Years of Education: N/A   Social History Main Topics  . Smoking status: Never Smoker   . Smokeless tobacco: Never Used  . Alcohol Use: No  . Drug Use: No  . Sexual Activity: Not Asked     Comment: married   Other Topics Concern  . None   Social History Narrative   Family History  Problem Relation Age of Onset  . Cancer Father     stomach cancer  . Cancer Brother     pancreatic cancer  . Stroke Maternal Grandmother   . Cancer Maternal Grandfather     lung cancer  . Cancer Paternal Grandmother     ovarian  . Heart disease Paternal Grandfather    Allergies  Allergen Reactions  . Sulfa Antibiotics Itching and Swelling    Throat, body swelling  . Doxycycline     Sun    Prior to Admission medications   Medication Sig Start  Date End Date Taking? Authorizing Provider  cloNIDine (CATAPRES) 0.2 MG tablet TAKE 1 TABLET BY MOUTH THREE TIMES A DAY 06/07/15  Yes Darlyne Russian, MD  LORazepam (ATIVAN) 1 MG tablet take 1 tablet by mouth every 4 hours if needed for anxiety 08/14/15  Yes Darlyne Russian, MD  mometasone (NASONEX) 50 MCG/ACT nasal spray Place 2 sprays into the nose daily as needed (allergies).    Yes Historical Provider, MD  ondansetron (ZOFRAN-ODT) 8 MG disintegrating tablet dissolve 1 tablet ON TONGUE every 8 hours if needed 04/12/15  Yes Elby Beck, FNP  polyethylene glycol (MIRALAX / GLYCOLAX) packet Take 17 g by mouth daily as needed.    Yes Historical Provider, MD  potassium chloride SA (K-DUR,KLOR-CON) 20 MEQ tablet take 3 tablets by mouth once daily 08/11/15  Yes Darlyne Russian, MD  sertraline (ZOLOFT) 100 MG tablet Take 1 tablet (100 mg total) by mouth daily. 04/12/15  Yes Elby Beck, FNP  Vitamin D, Ergocalciferol, (DRISDOL) 50000 UNITS CAPS capsule TAKE 1 CAPSULE BY MOUTH EVERY 7 DAYS 03/20/14  Yes Chelle Jeffery, PA-C  oxyCODONE (ROXICODONE) 5 MG immediate release tablet Take 1 tablet (5 mg total) by mouth every 4 (four) hours as needed for severe pain. Patient not taking: Reported on 09/14/2015 05/20/15   Jarrett Soho [place], PA-C     ROS: The patient denies fevers, chills, night sweats, unintentional weight loss, chest pain, palpitations, wheezing, dyspnea on exertion, nausea, vomiting, abdominal pain, dysuria, hematuria, melena, numbness, weakness, or tingling.  All other systems have been reviewed and were otherwise negative with the exception of those mentioned in the HPI and as above.    PHYSICAL EXAM: Filed Vitals:   09/14/15 1346  BP: 116/78  Pulse: 78  Temp: 98.3 F (36.8 C)  Resp: 18   Body mass index is 24.02 kg/(m^2).   General: Alert, no acute distress. Tearful not in distress HEENT:  Normocephalic, atraumatic, oropharynx patent. Thyroid has questionable nodule on right Eye: [facility], [facility] Cardiovascular:  Regular rate and rhythm, no rubs murmurs or gallops.  No Carotid bruits, radial pulse intact. No pedal edema.  Respiratory: Clear to auscultation bilaterally.  No wheezes, rales, or rhonchi.  No cyanosis, no use of accessory musculature Abdominal: No organomegaly, abdomen is soft and non-tender, positive bowel sounds. Abdomen has healed vertical scar, no masses. Musculoskeletal: Gait intact. No edema, tenderness on extremities. Skin: No rashes. Neurologic: Facial musculature symmetric. Psychiatric: Patient acts appropriately throughout our interaction. Lymphatic: No cervical or submandibular lymphadenopathy Meds ordered this encounter  Medications  . cloNIDine (CATAPRES) 0.2 MG tablet    Sig: TAKE 1 TABLET BY MOUTH THREE TIMES A DAY patient may take an extra pill if blood pressure spikes.    Dispense:  120 tablet    Refill:  11  .  FLUoxetine (PROZAC) 10 MG tablet    Sig: Take 1 tablet daily for the first week then 2 tablets daily.    Dispense:  60 tablet    Refill:  11   LABS:    EKG/XRAY:   Primary read interpreted by Dr. Everlene Farrier at [facility].   ASSESSMENT/PLAN: Labs drawn today. Will be on Prozac 10 mg a day for 2 weeks and then 2 tablets a day for a dose of 20 mg a day. She will start this in 2 weeks. She will wean off of the Zoloft over the next 2 weeks. She will decrease to 50 mg a day for one week then 50 mg every other  day. She has significant stressors at home mostly related to dealing with her mother is currently in a care facility.I personally performed the services described in this documentation, which was scribed in my presence. The recorded information has been reviewed and is accurate. Patient has thyroid ultrasound done last year in June. No biopsy was done at that time by patient choice. We'll go ahead and repeat ultrasound .I personally performed the services described in this documentation, which was scribed in my presence. The recorded information has been reviewed and is accurate.   Gross sideeffects, risk and benefits, and alternatives of medications d/w patient. Patient is aware that all medications have potential sideeffects and we are unable to predict every sideeffect or drug-drug interaction that may occur.  Arlyss Queen MD 09/14/2015 1:48 PM

## 2015-09-15 ENCOUNTER — Other Ambulatory Visit: Payer: Self-pay | Admitting: Emergency Medicine

## 2015-09-15 DIAGNOSIS — E876 Hypokalemia: Secondary | ICD-10-CM

## 2015-09-15 LAB — COMPREHENSIVE METABOLIC PANEL
ALT: 26 U/L (ref 6–29)
AST: 22 U/L (ref 10–35)
Albumin: 4 g/dL (ref 3.6–5.1)
Alkaline Phosphatase: 45 U/L (ref 33–130)
BILIRUBIN TOTAL: 0.4 mg/dL (ref 0.2–1.2)
BUN: 18 mg/dL (ref 7–25)
CHLORIDE: 101 mmol/L (ref 98–110)
CO2: 21 mmol/L (ref 20–31)
CREATININE: 0.76 mg/dL (ref 0.50–1.05)
Calcium: 9.2 mg/dL (ref 8.6–10.4)
GLUCOSE: 90 mg/dL (ref 65–99)
Potassium: 3.2 mmol/L — ABNORMAL LOW (ref 3.5–5.3)
SODIUM: 137 mmol/L (ref 135–146)
Total Protein: 6.7 g/dL (ref 6.1–8.1)

## 2015-09-15 LAB — HEPATITIS C ANTIBODY: HCV Ab: NEGATIVE

## 2015-09-15 LAB — THYROID PANEL WITH TSH
Free Thyroxine Index: 1.6 (ref 1.4–3.8)
T3 Uptake: 35 % (ref 22–35)
T4 TOTAL: 4.7 ug/dL (ref 4.5–12.0)
TSH: 1.75 m[IU]/L

## 2015-09-15 MED ORDER — POTASSIUM CHLORIDE CRYS ER 20 MEQ PO TBCR: EXTENDED_RELEASE_TABLET | ORAL | Status: DC

## 2015-09-16 DIAGNOSIS — M79671 Pain in right foot: Secondary | ICD-10-CM | POA: Diagnosis not present

## 2015-09-17 ENCOUNTER — Encounter: Payer: Self-pay | Admitting: *Deleted

## 2015-09-22 ENCOUNTER — Encounter: Payer: Self-pay | Admitting: Emergency Medicine

## 2015-09-24 ENCOUNTER — Ambulatory Visit (HOSPITAL_BASED_OUTPATIENT_CLINIC_OR_DEPARTMENT_OTHER)
Admission: RE | Admit: 2015-09-24 | Discharge: 2015-09-24 | Disposition: A | Discharge status: Home / Self Care | Payer: BLUE CROSS/BLUE SHIELD | Source: Ambulatory Visit | Attending: Family Medicine | Admitting: Family Medicine

## 2015-09-24 ENCOUNTER — Ambulatory Visit (HOSPITAL_BASED_OUTPATIENT_CLINIC_OR_DEPARTMENT_OTHER): Payer: BLUE CROSS/BLUE SHIELD

## 2015-09-24 ENCOUNTER — Ambulatory Visit (INDEPENDENT_AMBULATORY_CARE_PROVIDER_SITE_OTHER): Payer: BLUE CROSS/BLUE SHIELD

## 2015-09-24 ENCOUNTER — Other Ambulatory Visit: Payer: Self-pay | Admitting: Family Medicine

## 2015-09-24 ENCOUNTER — Encounter (HOSPITAL_BASED_OUTPATIENT_CLINIC_OR_DEPARTMENT_OTHER): Payer: Self-pay

## 2015-09-24 ENCOUNTER — Ambulatory Visit (INDEPENDENT_AMBULATORY_CARE_PROVIDER_SITE_OTHER): Payer: BLUE CROSS/BLUE SHIELD | Admitting: Family Medicine

## 2015-09-24 VITALS — BP 122/80 | HR 81 | Temp 98.0°F | Resp 16 | Ht 67.0 in | Wt 149.0 lb

## 2015-09-24 DIAGNOSIS — R1084 Generalized abdominal pain: Secondary | ICD-10-CM

## 2015-09-24 DIAGNOSIS — Z9049 Acquired absence of other specified parts of digestive tract: Secondary | ICD-10-CM | POA: Insufficient documentation

## 2015-09-24 DIAGNOSIS — K449 Diaphragmatic hernia without obstruction or gangrene: Secondary | ICD-10-CM | POA: Insufficient documentation

## 2015-09-24 DIAGNOSIS — K529 Noninfective gastroenteritis and colitis, unspecified: Secondary | ICD-10-CM | POA: Insufficient documentation

## 2015-09-24 DIAGNOSIS — R1031 Right lower quadrant pain: Secondary | ICD-10-CM | POA: Diagnosis not present

## 2015-09-24 DIAGNOSIS — M5137 Other intervertebral disc degeneration, lumbosacral region: Secondary | ICD-10-CM | POA: Insufficient documentation

## 2015-09-24 LAB — POCT CBC
Granulocyte percent: 56.5 %G (ref 37–80)
HEMATOCRIT: 41.6 % (ref 37.7–47.9)
HEMOGLOBIN: 14.4 g/dL (ref 12.2–16.2)
Lymph, poc: 3.2 (ref 0.6–3.4)
MCH: 32.6 pg — AB (ref 27–31.2)
MCHC: 34.6 g/dL (ref 31.8–35.4)
MCV: 94.2 fL (ref 80–97)
MID (cbc): 0.8 (ref 0–0.9)
MPV: 8.3 fL (ref 0–99.8)
POC GRANULOCYTE: 5.1 (ref 2–6.9)
POC LYMPH PERCENT: 34.8 %L (ref 10–50)
POC MID %: 8.7 %M (ref 0–12)
Platelet Count, POC: 239 10*3/uL (ref 142–424)
RBC: 4.42 M/uL (ref 4.04–5.48)
RDW, POC: 14 %
WBC: 9.1 10*3/uL (ref 4.6–10.2)

## 2015-09-24 LAB — COMPREHENSIVE METABOLIC PANEL
ALBUMIN: 4.2 g/dL (ref 3.6–5.1)
ALT: 26 U/L (ref 6–29)
AST: 23 U/L (ref 10–35)
Alkaline Phosphatase: 45 U/L (ref 33–130)
BILIRUBIN TOTAL: 0.7 mg/dL (ref 0.2–1.2)
BUN: 16 mg/dL (ref 7–25)
CHLORIDE: 100 mmol/L (ref 98–110)
CO2: 26 mmol/L (ref 20–31)
CREATININE: 0.91 mg/dL (ref 0.50–1.05)
Calcium: 10.4 mg/dL (ref 8.6–10.4)
GLUCOSE: 97 mg/dL (ref 65–99)
Potassium: 4 mmol/L (ref 3.5–5.3)
Sodium: 139 mmol/L (ref 135–146)
Total Protein: 7.2 g/dL (ref 6.1–8.1)

## 2015-09-24 LAB — POC MICROSCOPIC URINALYSIS (UMFC): Mucus: ABSENT

## 2015-09-24 LAB — POCT URINALYSIS DIP (MANUAL ENTRY)
Blood, UA: NEGATIVE
GLUCOSE UA: NEGATIVE
Leukocytes, UA: NEGATIVE
NITRITE UA: NEGATIVE
Protein Ur, POC: 30 — AB
SPEC GRAV UA: 1.02
UROBILINOGEN UA: 0.2
pH, UA: 5.5

## 2015-09-24 MED ORDER — ONDANSETRON 4 MG PO TBDP
8.0000 mg | ORAL_TABLET | Freq: Once | ORAL | Status: AC
  Administered 2015-09-24: 8 mg via ORAL

## 2015-09-24 MED ORDER — IOPAMIDOL (ISOVUE-300) INJECTION 61%
100.0000 mL | Freq: Once | INTRAVENOUS | Status: AC | PRN
  Administered 2015-09-24: 100 mL via INTRAVENOUS

## 2015-09-24 NOTE — Patient Instructions (Addendum)
Please report to the [facility] in [facility] for CT Scan. You are to go into the main entrance. Oral contrast will be given at [facility].     IF you received an x-ray today, you will receive an invoice from [facility]. Please contact [facility] at [phone] with questions or concerns regarding your invoice.   IF you received labwork today, you will receive an invoice from [employer]. Please contact [facility] at [phone] with questions or concerns regarding your invoice.   Our billing staff will not be able to assist you with questions regarding bills from these companies.  You will be contacted with the lab results as soon as they are available. The fastest way to get your results is to activate your My Chart account. Instructions are located on the last page of this paperwork. If you have not heard from [place] regarding the results in 2 weeks, please contact this office.

## 2015-09-24 NOTE — Progress Notes (Addendum)
Subjective:  This chart was scribed for Delman Cheadle MD, by Tamsen Roers, at [facility].  This patient was seen in room 12 and the patient's care was started at 8:48 AM.   Chief Complaint  Patient presents with  . Abdominal issues    x 1 month   . Back Pain     Patient ID: Beth Frey, female    DOB: July 08, 1955, 60 y.o.   MRN: [id number]  HPI HPI Comments: Beth Frey is a 60 y.o. female who presents to the [facility] complaining of a rigidity around her abdomen for the past month and a half.  She denies any pain in the area but describes of bilateral burning/tearing pain in her lumbar region with urination.  She denies any change in the color of her urine.  Patient states that her pain was the worst three days ago.  She denies any fever/chills, hematochezia, hematuria . She has not taken any medications. Patient does not feel more "gassy" than usual. Patient is compliant with her potassium and states that she feels when it runs low due to her cramping.   She has also been having difficulty with her bowel movements (currently some diarrhea) but states that it is not unusual for her.  She has a decreased appetite and thinks its linked to increased anxiety.  She also has some substernal heart burn. Denies vaginal discharge or bleeding.    Patient has had a liposuction in the past.  She had a colectomy (emergency surgery) 5 years ago.   Patient has a history of anorexia 20 years ago.  Patient has a history of a right ovarian cyst.  She has had an tubal ablation but still has her ovaries.  Patients last pap and pelvic exam was here at [facility] (which did not show any concerns)   She has had an ultrasound of her pelvis in the past which showed no concerns. In 2014 there was a small bowel intussusception which was not causing her any pain and so they decided to let it resolve.    Past Medical History  Diagnosis Date  . Back pain, chronic   . 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 Osteoarthritis   . Cancer ([place])     skin  . Anxiety     Current Outpatient Prescriptions on File Prior to Visit  Medication Sig Dispense Refill  . cloNIDine (CATAPRES) 0.2 MG tablet TAKE 1 TABLET BY MOUTH THREE TIMES A DAY patient may take an extra pill if blood pressure spikes. 120 tablet 11  . FLUoxetine (PROZAC) 10 MG tablet Take 1 tablet daily for the first week then 2 tablets daily. (Patient taking differently: 20 mg. Take 1 tablet daily for the first week then 2 tablets daily.) 60 tablet 11  . LORazepam (ATIVAN) 1 MG tablet take 1 tablet by mouth every 4 hours if needed for anxiety 120 tablet 5  . mometasone (NASONEX) 50 MCG/ACT nasal spray Place 2 sprays into the nose daily as needed (allergies).     . ondansetron (ZOFRAN-ODT) 8 MG disintegrating tablet dissolve 1 tablet ON TONGUE every 8 hours if needed 30 tablet 4  . polyethylene glycol (MIRALAX / GLYCOLAX) packet Take 17 g by mouth daily as needed.     . potassium chloride SA (K-DUR,KLOR-CON) 20 MEQ tablet Take 2 tablets in the morning 2 tablets at night for a total daily dose of 80 mEq 120 tablet 11  . Vitamin D, Ergocalciferol, (  DRISDOL) 50000 UNITS CAPS capsule TAKE 1 CAPSULE BY MOUTH EVERY 7 DAYS 4 capsule 0   No current facility-administered medications on file prior to visit.    Allergies  Allergen Reactions  . Sulfa Antibiotics Itching and Swelling    Throat, body swelling  . Doxycycline     Sun      Review of Systems  Constitutional: Positive for appetite change. Negative for fever and chills.  Eyes: Negative for pain, redness and itching.  Respiratory: Negative for cough, choking and shortness of breath.   Gastrointestinal: Negative for nausea, vomiting and blood in stool.  Genitourinary: Negative for hematuria, decreased urine volume, vaginal bleeding and vaginal discharge.  Musculoskeletal: Positive for back pain. Negative for neck pain and neck stiffness.  Skin: Negative for color change.    Neurological: Negative for syncope and speech difficulty.       Objective:   Physical Exam  Constitutional: She is oriented to person, place, and time.  HENT:  Head: Normocephalic and atraumatic.  Eyes: Pupils are equal, round, and reactive to light.  Cardiovascular: Normal rate, regular rhythm and normal heart sounds.  Exam reveals no friction rub.   No murmur heard. Pulmonary/Chest: Effort normal and breath sounds normal. No respiratory distress. She has no wheezes. She has no rales.  Abdominal: There is no CVA tenderness.  Normal bowel sounds but tympanitic.  Decreased over the right lower quadrant Reducible umbilical hernia Tenderness to palp in right lower quadrant without rebound or guarding or referred pain  Neurological: She is alert and oriented to person, place, and time.  Skin: Skin is warm and dry.  Psychiatric: She has a normal mood and affect. Her behavior is normal.   Filed Vitals:   09/24/15 0839  BP: 122/80  Pulse: 81  Temp: 98 F (36.7 C)  TempSrc: Oral  Resp: 16  Height: 5\' 7"  (1.702 m)  Weight: 149 lb (67.586 kg)  SpO2: 98%    Results for orders placed or performed in visit on 09/24/15  POCT Microscopic Urinalysis ([facility])  Result Value Ref Range   WBC,UR,HPF,POC None None WBC/hpf   RBC,UR,HPF,POC None None RBC/hpf   Bacteria Few (A) None, Too numerous to count   Mucus Absent Absent   Epithelial Cells, UR Per Microscopy Few (A) None, Too numerous to count cells/hpf  POCT urinalysis dipstick  Result Value Ref Range   Color, UA yellow yellow   Clarity, UA clear clear   Glucose, UA negative negative   Bilirubin, UA moderate (A) negative   Ketones, POC UA trace (5) (A) negative   Spec Grav, UA 1.020    Blood, UA negative negative   pH, UA 5.5    Protein Ur, POC =30 (A) negative   Urobilinogen, UA 0.2    Nitrite, UA Negative Negative   Leukocytes, UA Negative Negative  POCT CBC  Result Value Ref Range   WBC 9.1 4.6 - 10.2 K/uL   Lymph, poc  3.2 0.6 - 3.4   POC LYMPH PERCENT 34.8 10 - 50 %L   MID (cbc) 0.8 0 - 0.9   POC MID % 8.7 0 - 12 %M   POC Granulocyte 5.1 2 - 6.9   Granulocyte percent 56.5 37 - 80 %G   RBC 4.42 4.04 - 5.48 M/uL   Hemoglobin 14.4 12.2 - 16.2 g/dL   HCT, POC 41.6 37.7 - 47.9 %   MCV 94.2 80 - 97 fL   MCH, POC 32.6 (A) 27 - 31.2 pg  MCHC 34.6 31.8 - 35.4 g/dL   RDW, POC 14.0 %   Platelet Count, POC 239 142 - 424 K/uL   MPV 8.3 0 - 99.8 fL   Ct Abdomen Pelvis W Contrast  09/24/2015  CLINICAL DATA:  60 year old female with 3 weeks of abdominal pain, nausea and diarrhea EXAM: CT ABDOMEN AND PELVIS WITH CONTRAST TECHNIQUE: Multidetector CT imaging of the abdomen and pelvis was performed using the standard protocol following bolus administration of intravenous contrast. CONTRAST:  132mL ISOVUE-300 IOPAMIDOL (ISOVUE-300) INJECTION 61% COMPARISON:  Prior CT abdomen/pelvis 11/13/2012 FINDINGS: Lower chest: The lung bases are clear. Visualized cardiac structures are within normal limits for size. No pericardial effusion. Small hiatal hernia. Hepatobiliary: No masses or other significant abnormality. Pancreas: No mass, inflammatory changes, or other significant abnormality. Spleen: Within normal limits in size and appearance. Adrenals/Urinary Tract: No masses identified. No evidence of hydronephrosis. Stomach/Bowel: No evidence of obstruction or abnormal fluid collections. Fluid-filled colon consistent with diarrheal state. Slightly increased vascularity throughout the descending and sigmoid colonic mesenteries. Suggestion of diffuse mild colonic wall thickening in the involved segments, particularly in the sigmoid colon. No free fluid or suspicious adenopathy. Mild right-sided mesenteric adenopathy likely reactive and related to prior right hemicolectomy. Moderate size periampullary duodenum diverticulum. Surgical changes of prior right hemicolectomy with patent ileocolonic anastomosis. Vascular/Lymphatic: No pathologically  enlarged lymph nodes. No evidence of abdominal aortic aneurysm. Reproductive: No mass or other significant abnormality. Other: None. Musculoskeletal: No suspicious bone lesions identified. L5-S1 degenerative disc disease. Left-sided L4-L5 facet arthropathy. IMPRESSION: 1. Nonspecific colitis involving the rectum, sigmoid, and to a lesser extent descending colon. Findings include mild submucosal edema and increased mesenteric vascularity. Differential considerations include both infectious and inflammatory colitis. Given the imaging appearance, and inflammatory colitis is favored. 2. Fluid-filled colon consistent with the clinical history of diarrheal state. 3. Small hiatal hernia. 4. Surgical changes of prior right hemicolectomy with patent ileocolonic anastomosis and no evidence of complicating feature. 5. Left-sided L4-L5 facet arthropathy and L5-S1 degenerative disc disease. Electronically Signed   By: Jacqulynn Cadet M.D.   On: 09/24/2015 13:00   Dg Abd Acute W/chest  09/24/2015  CLINICAL DATA:  Increasing RIGHT lower quadrant pain pain. Hemicolectomy. Suspect small bowel obstruction EXAM: DG ABDOMEN ACUTE W/ 1V CHEST COMPARISON:  None. FINDINGS: Lungs are clear.  No free air beneath hemidiaphragm. Short segment of dilated small bowel in the RIGHT lower quadrant adjacent to surgical clips. There is air-fluid levels within these localize dilated loops of small bowel. There is gas the rectum. IMPRESSION: 1. No evidence of high-grade bowel obstruction. 2. Dilated loops of bowel in the RIGHT quadrant adjacent to surgical clips. Electronically Signed   By: Suzy Bouchard M.D.   On: 09/24/2015 09:24       Assessment & Plan:   1. Generalized abdominal pain   If sxs cont, collect a c. Diff and mega GI panel. I cam concerned that she could have a partial SBO from the scar tissue after her prior surgery but certainly having such as atypical surg GI sxs is also concerning so sent for stat CT - only clear  liquids.  Called pt's GI dr - she has an appt sched with Dr. Cristina Gong for next week - he is not in the office but spoke with the GI physician on call for his group who reviewed imaging and agrees that an episode of watchful waiting with pain management and liquid diet is reasonable for this infectious vs inflammatory colitis. If sxs worsen  at all over the wkend, RTC for further eval. Pt understands and agrees to plan.  Orders Placed This Encounter  Procedures  . DG Abd Acute W/Chest    Standing Status: Future     Number of Occurrences: 1     Standing Expiration Date: 09/23/2016    Order Specific Question:  Reason for exam:    Answer:  Increasing RLQ pain in area of hemicolectomy, suspect partial SBO    Order Specific Question:  Is the patient pregnant?    Answer:  No    Order Specific Question:  Preferred imaging location?    Answer:  [facility]  . CT Abdomen Pelvis W Contrast    Pt can leave after CT.    Standing Status: Future     Number of Occurrences: 1     Standing Expiration Date: 12/24/2016    Order Specific Question:  If indicated for the ordered procedure, I authorize the administration of contrast media per Radiology protocol    Answer:  Yes    Order Specific Question:  Reason for Exam (SYMPTOM  OR DIAGNOSIS REQUIRED)    Answer:  concern for SBO    Order Specific Question:  Is the patient pregnant?    Answer:  No    Order Specific Question:  Preferred imaging location?    Answer:  [facility]  . Comprehensive metabolic panel    Order Specific Question:  Has the patient fasted?    Answer:  No  . POCT Microscopic Urinalysis ([facility])  . POCT urinalysis dipstick  . POCT CBC    Meds ordered this encounter  Medications  . ondansetron (ZOFRAN-ODT) disintegrating tablet 8 mg    Sig:     I personally performed the services described in this documentation, which was scribed in my presence. The recorded information has been reviewed and considered, and addended by me as  needed.  Delman Cheadle, MD MPH

## 2015-09-25 LAB — SEDIMENTATION RATE: SED RATE: 7 mm/h (ref 0–30)

## 2015-09-29 ENCOUNTER — Encounter: Payer: BLUE CROSS/BLUE SHIELD | Admitting: Podiatry

## 2015-09-30 NOTE — Progress Notes (Signed)
Patient cancelled appt.

## 2015-10-03 ENCOUNTER — Other Ambulatory Visit: Payer: Self-pay | Admitting: Family Medicine

## 2015-10-19 ENCOUNTER — Ambulatory Visit: Payer: BLUE CROSS/BLUE SHIELD | Admitting: Emergency Medicine

## 2015-11-11 ENCOUNTER — Ambulatory Visit: Payer: BLUE CROSS/BLUE SHIELD | Admitting: Emergency Medicine

## 2015-12-18 ENCOUNTER — Telehealth: Payer: Self-pay | Admitting: Emergency Medicine

## 2015-12-18 NOTE — Telephone Encounter (Signed)
Please call patient. Can we schedule her for a thyroid ultrasound and biopsy of the thyroid  we previously discussed. Is she willing to let me schedule that. If she would like to call and talk to me about it that would be fine [phone] thank you

## 2015-12-18 NOTE — Telephone Encounter (Signed)
See previous note. Patient to be called regarding scheduling of a thyroid biopsy.

## 2015-12-20 NOTE — Telephone Encounter (Signed)
Left message for pt to call back  °

## 2015-12-22 NOTE — Telephone Encounter (Signed)
Attempted to call pt, left VM for pt to call back asap  

## 2015-12-24 NOTE — Telephone Encounter (Signed)
Sent unable to reach letter.

## 2015-12-28 ENCOUNTER — Other Ambulatory Visit: Payer: Self-pay | Admitting: Emergency Medicine

## 2015-12-28 DIAGNOSIS — Z1231 Encounter for screening mammogram for malignant neoplasm of breast: Secondary | ICD-10-CM

## 2015-12-30 ENCOUNTER — Ambulatory Visit (HOSPITAL_BASED_OUTPATIENT_CLINIC_OR_DEPARTMENT_OTHER)
Admission: RE | Admit: 2015-12-30 | Discharge: 2015-12-30 | Disposition: A | Discharge status: Home / Self Care | Payer: BLUE CROSS/BLUE SHIELD | Source: Ambulatory Visit | Attending: Emergency Medicine | Admitting: Emergency Medicine

## 2015-12-30 ENCOUNTER — Other Ambulatory Visit (HOSPITAL_BASED_OUTPATIENT_CLINIC_OR_DEPARTMENT_OTHER): Payer: Self-pay | Admitting: Emergency Medicine

## 2015-12-30 ENCOUNTER — Encounter (HOSPITAL_BASED_OUTPATIENT_CLINIC_OR_DEPARTMENT_OTHER): Payer: Self-pay

## 2015-12-30 DIAGNOSIS — Z1231 Encounter for screening mammogram for malignant neoplasm of breast: Secondary | ICD-10-CM

## 2015-12-30 DIAGNOSIS — R52 Pain, unspecified: Secondary | ICD-10-CM

## 2015-12-30 DIAGNOSIS — T1490XA Injury, unspecified, initial encounter: Secondary | ICD-10-CM

## 2015-12-30 DIAGNOSIS — E042 Nontoxic multinodular goiter: Secondary | ICD-10-CM | POA: Diagnosis not present

## 2015-12-30 DIAGNOSIS — T1490 Injury, unspecified: Secondary | ICD-10-CM | POA: Diagnosis not present

## 2015-12-30 DIAGNOSIS — S92511A Displaced fracture of proximal phalanx of right lesser toe(s), initial encounter for closed fracture: Secondary | ICD-10-CM | POA: Diagnosis not present

## 2015-12-30 DIAGNOSIS — E041 Nontoxic single thyroid nodule: Secondary | ICD-10-CM

## 2015-12-30 DIAGNOSIS — M795 Residual foreign body in soft tissue: Secondary | ICD-10-CM | POA: Diagnosis not present

## 2015-12-30 DIAGNOSIS — X58XXXA Exposure to other specified factors, initial encounter: Secondary | ICD-10-CM | POA: Diagnosis not present

## 2015-12-31 ENCOUNTER — Telehealth: Payer: Self-pay

## 2015-12-31 ENCOUNTER — Other Ambulatory Visit: Payer: Self-pay | Admitting: Emergency Medicine

## 2015-12-31 NOTE — Telephone Encounter (Signed)
Patient wants to let Dr. Everlene Farrier know that she finally got a mammogram and thyroid ultrasound. Patient would like for Dr. Everlene Farrier to look at it and call with the results.   [phone]

## 2016-01-01 ENCOUNTER — Other Ambulatory Visit: Payer: Self-pay | Admitting: Emergency Medicine

## 2016-01-01 ENCOUNTER — Telehealth: Payer: Self-pay | Admitting: *Deleted

## 2016-01-01 ENCOUNTER — Telehealth: Payer: Self-pay | Admitting: Emergency Medicine

## 2016-01-01 DIAGNOSIS — E042 Nontoxic multinodular goiter: Secondary | ICD-10-CM

## 2016-01-01 NOTE — Telephone Encounter (Signed)
Call patient and let her know I sent her to Dr. Loanne Drilling. He is a thyroid specialist who also performs his own biopsies.

## 2016-01-01 NOTE — Telephone Encounter (Signed)
Please call breast center on Monday 01/03/16 to get mammogram report.  It looks like she had it on 12/30/15.  No report in epic yet.

## 2016-01-01 NOTE — Telephone Encounter (Signed)
Pt.notified

## 2016-01-03 NOTE — Telephone Encounter (Signed)
There was one done today, check imaging tab.

## 2016-01-04 ENCOUNTER — Telehealth: Payer: Self-pay

## 2016-01-04 DIAGNOSIS — E041 Nontoxic single thyroid nodule: Secondary | ICD-10-CM

## 2016-01-04 NOTE — Telephone Encounter (Signed)
Patient would like for Dr. Everlene Farrier to put an order in for a bilateral thyroid biopsy at [facility]. This is so she can get the results before her referral appointment for the endocrinologist 02/08/16. Please advise!  [phone]

## 2016-01-04 NOTE — Telephone Encounter (Signed)
No evidence of cancer on her mammogram. Continue yearly screening. See if her appointment has been scheduled yet to see Dr. Loanne Drilling regarding her thyroid nodules.

## 2016-01-05 NOTE — Telephone Encounter (Signed)
Please place the order to interventional radiology for bilateral thyroid nodule biopsies as well as repeat thyroid ultrasound at the time of biopsy.

## 2016-01-05 NOTE — Telephone Encounter (Signed)
Left message for pt to call back  °

## 2016-01-06 ENCOUNTER — Telehealth: Payer: Self-pay | Admitting: *Deleted

## 2016-01-06 NOTE — Telephone Encounter (Signed)
Orders placed.

## 2016-01-06 NOTE — Telephone Encounter (Signed)
Pt notified about results.  She cannot get an appt til October for endocrinology.  She wants to know if you can make her a referral for biopsy at [facility]?

## 2016-01-06 NOTE — Telephone Encounter (Signed)
Ok I can change order

## 2016-01-06 NOTE — Telephone Encounter (Signed)
It looks like the order placed is just for an ultrasound, which she has already had.  I think the order needs to be a biopsy order.

## 2016-01-06 NOTE — Telephone Encounter (Signed)
Patient states that she needs the order for the ultrasound placed at [facility] and it's supposed to be for the bilateral thyroid biopsy. Please call patient. There was a little confusion in the phone call.   [phone]

## 2016-01-06 NOTE — Telephone Encounter (Signed)
-----   Message from Doylene Canning, [place] sent at 01/06/2016  8:49 AM EDT ----- Message should say mammogram shows no signs of malignancy.  I have made a referral to Dr. Loanne Drilling for evaluation of the thyroid nodules.  ----- Message ----- From: Darlyne Russian, MD Sent: 01/03/2016   2:07 PM To: [facility] Clinical Message Pool  Call Pap smear shows no signs of malignancy. I have made referral to Dr. Loanne Drilling for evaluation of the thyroid nodules.

## 2016-01-06 NOTE — Telephone Encounter (Signed)
Pt notified about results.  She cannot get an appt til October for endocrinology.  She wants to know if you can make her a referral for biopsy at [facility]?

## 2016-01-07 NOTE — Telephone Encounter (Signed)
Ok I think I have it now.

## 2016-01-07 NOTE — Telephone Encounter (Signed)
I think the orders are in for her to go ahead and have the biopsy done. Please check on this.

## 2016-01-11 DIAGNOSIS — Z85828 Personal history of other malignant neoplasm of skin: Secondary | ICD-10-CM | POA: Diagnosis not present

## 2016-01-11 DIAGNOSIS — L820 Inflamed seborrheic keratosis: Secondary | ICD-10-CM | POA: Diagnosis not present

## 2016-01-11 DIAGNOSIS — L989 Disorder of the skin and subcutaneous tissue, unspecified: Secondary | ICD-10-CM | POA: Diagnosis not present

## 2016-01-12 NOTE — Telephone Encounter (Signed)
Referrals is working on order.

## 2016-01-20 ENCOUNTER — Encounter (HOSPITAL_COMMUNITY): Payer: Self-pay | Admitting: Emergency Medicine

## 2016-01-20 ENCOUNTER — Emergency Department (HOSPITAL_COMMUNITY)
Admission: EM | Admit: 2016-01-20 | Discharge: 2016-01-21 | Disposition: A | Discharge status: Home / Self Care | Payer: BLUE CROSS/BLUE SHIELD | Attending: Emergency Medicine | Admitting: Emergency Medicine

## 2016-01-20 ENCOUNTER — Emergency Department (HOSPITAL_COMMUNITY): Payer: BLUE CROSS/BLUE SHIELD

## 2016-01-20 DIAGNOSIS — Z85828 Personal history of other malignant neoplasm of skin: Secondary | ICD-10-CM | POA: Diagnosis not present

## 2016-01-20 DIAGNOSIS — Z79899 Other long term (current) drug therapy: Secondary | ICD-10-CM | POA: Insufficient documentation

## 2016-01-20 DIAGNOSIS — Y929 Unspecified place or not applicable: Secondary | ICD-10-CM | POA: Insufficient documentation

## 2016-01-20 DIAGNOSIS — M533 Sacrococcygeal disorders, not elsewhere classified: Secondary | ICD-10-CM | POA: Diagnosis not present

## 2016-01-20 DIAGNOSIS — Y990 Civilian activity done for income or pay: Secondary | ICD-10-CM | POA: Diagnosis not present

## 2016-01-20 DIAGNOSIS — M79602 Pain in left arm: Secondary | ICD-10-CM | POA: Diagnosis not present

## 2016-01-20 DIAGNOSIS — W208XXA Other cause of strike by thrown, projected or falling object, initial encounter: Secondary | ICD-10-CM | POA: Insufficient documentation

## 2016-01-20 DIAGNOSIS — S6992XA Unspecified injury of left wrist, hand and finger(s), initial encounter: Secondary | ICD-10-CM | POA: Diagnosis present

## 2016-01-20 DIAGNOSIS — Y9389 Activity, other specified: Secondary | ICD-10-CM | POA: Diagnosis not present

## 2016-01-20 DIAGNOSIS — M25532 Pain in left wrist: Secondary | ICD-10-CM | POA: Diagnosis not present

## 2016-01-20 DIAGNOSIS — S63502A Unspecified sprain of left wrist, initial encounter: Secondary | ICD-10-CM | POA: Insufficient documentation

## 2016-01-20 DIAGNOSIS — T148 Other injury of unspecified body region: Secondary | ICD-10-CM | POA: Diagnosis not present

## 2016-01-20 MED ORDER — OXYCODONE-ACETAMINOPHEN 5-325 MG PO TABS: 1.0000 | ORAL_TABLET | ORAL | 0 refills | Status: DC | PRN

## 2016-01-20 MED ORDER — MORPHINE SULFATE (PF) 4 MG/ML IV SOLN
4.0000 mg | Freq: Once | INTRAVENOUS | Status: AC
  Administered 2016-01-20: 4 mg via INTRAVENOUS
  Filled 2016-01-20: qty 1

## 2016-01-20 MED ORDER — ONDANSETRON HCL 4 MG/2ML IJ SOLN
4.0000 mg | Freq: Once | INTRAMUSCULAR | Status: AC
  Administered 2016-01-20: 4 mg via INTRAVENOUS
  Filled 2016-01-20: qty 2

## 2016-01-20 MED ORDER — FENTANYL CITRATE (PF) 100 MCG/2ML IJ SOLN
50.0000 ug | Freq: Once | INTRAMUSCULAR | Status: AC
  Administered 2016-01-20: 50 ug via INTRAVENOUS
  Filled 2016-01-20: qty 2

## 2016-01-20 MED ORDER — KETOROLAC TROMETHAMINE 30 MG/ML IJ SOLN
30.0000 mg | Freq: Once | INTRAMUSCULAR | Status: AC
  Administered 2016-01-20: 30 mg via INTRAVENOUS
  Filled 2016-01-20: qty 1

## 2016-01-20 NOTE — ED Provider Notes (Signed)
[facility] DEPT Provider Note   CSN: [id number] Arrival date & time: 01/20/16  2104  By signing my name below, I, Beth Frey, attest that this documentation has been prepared under the direction and in the presence of [place], [place]. Electronically Signed: Gwenlyn Frey, ED Scribe. 01/20/16. 9:44 PM.  History   Chief Complaint Chief Complaint  Patient presents with  . Hand Injury   The history is provided by the patient. No language interpreter was used.    HPI Comments: Beth Frey is a 60 y.o. female with PMHx of chronic back pain and ostearthritis who presents to the Emergency Department complaining of constant left wrist pain s/p accident PTA. Pt was working out on a stair climber and the machine fell over and fell onto her left arm. Pt denies hitting her head or LOC. She states the wrist did not look displaced. She states pain is mainly localized to her left wrist. Pt is unaware of any other injuries or pain at this time. She thinks her sensation is intact in her left hand.    Past Medical History:  Diagnosis Date  . Anxiety   . Back pain, chronic   . Cancer ([place])    skin  . 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 Osteoarthritis     Patient Active Problem List   Diagnosis Date Noted  . Laxative abuse 07/28/2014  . Cellulitis 10/21/2013  . Cat bite of left lower leg 10/21/2013  . Septic arthritis of knee, left ([place]) 10/21/2013  . RLQ abdominal pain 11/13/2012  . Unspecified constipation 11/13/2012  . Hyperlipidemia 11/23/2011  . Vitamin D deficiency 11/23/2011  . History of abnormal cervical Pap smear 11/23/2011  . Anxiety disorder 11/23/2011  . Thyroid nodule 11/23/2011  . Skin cancer 11/23/2011  . Chronic hypokalemia 11/23/2011  . Incisional hernia 06/14/2011    Past Surgical History:  Procedure Laterality Date  . ABLATION SAPHENOUS VEIN W/ RFA    . BREAST SURGERY  August 2011   Implants removed  . CHOLECYSTECTOMY    . COLON SURGERY  02/20/2010   Right  hemicolectomy-cecal bascule  . ELBOW SURGERY     left elbow  . JOINT REPLACEMENT  2006   Left total knee replacement  . OVARIAN CYST REMOVAL     right  . SKIN CANCER EXCISION     multiple-SCC and BCC  . TUBAL LIGATION    . Uterine ablation    . WRIST SURGERY  2009   right wrist    OB History    No data available       Home Medications    Prior to Admission medications   Medication Sig Start Date End Date Taking? Authorizing Provider  cloNIDine (CATAPRES) 0.2 MG tablet TAKE 1 TABLET BY MOUTH THREE TIMES A DAY patient may take an extra pill if blood pressure spikes. Patient taking differently: Take 0.2 mg by mouth 3 (three) times daily. patient may take an extra pill if blood pressure spikes. 09/14/15  Yes Darlyne Russian, MD  FLUoxetine (PROZAC) 10 MG tablet Take 1 tablet daily for the first week then 2 tablets daily. Patient taking differently: Take 20 mg by mouth daily.  09/14/15  Yes Darlyne Russian, MD  LORazepam (ATIVAN) 1 MG tablet take 1 tablet by mouth every 4 hours if needed for anxiety Patient taking differently: Take 1 mg by mouth three times daily 01/01/16  Yes Darlyne Russian, MD  mometasone (NASONEX) 50 MCG/ACT nasal spray Place 2 sprays into the  nose daily as needed (allergies).    Yes Historical Provider, MD  ondansetron (ZOFRAN-ODT) 8 MG disintegrating tablet dissolve 1 tablet ON TONGUE every 8 hours if needed 04/12/15  Yes Elby Beck, FNP  polyethylene glycol (MIRALAX / GLYCOLAX) packet Take 17 g by mouth daily as needed for mild constipation or moderate constipation.    Yes Historical Provider, MD  potassium chloride SA (K-DUR,KLOR-CON) 20 MEQ tablet Take 2 tablets in the morning 2 tablets at night for a total daily dose of 80 mEq Patient taking differently: Take 40 mEq by mouth 2 (two) times daily.  09/15/15  Yes Darlyne Russian, MD  Vitamin D, Ergocalciferol, (DRISDOL) 50000 UNITS CAPS capsule TAKE 1 CAPSULE BY MOUTH EVERY 7 DAYS Patient taking differently: Take  50,000 Units by mouth every 7 (seven) days.  03/20/14  Yes Harrison Mons, PA-C    Family History Family History  Problem Relation Age of Onset  . Cancer Father     stomach cancer  . Cancer Brother     pancreatic cancer  . Stroke Maternal Grandmother   . Cancer Maternal Grandfather     lung cancer  . Cancer Paternal Grandmother     ovarian  . Heart disease Paternal Grandfather     Social History Social History  Substance Use Topics  . Smoking status: Never Smoker  . Smokeless tobacco: Never Used  . Alcohol use No     Allergies   Sulfa antibiotics and Doxycycline   Review of Systems Review of Systems  Constitutional: Negative for fever.  Musculoskeletal: Positive for arthralgias.  Neurological: Negative for syncope.  All other systems reviewed and are negative.    Physical Exam Updated Vital Signs BP 116/84 (BP Location: Right Arm)   Pulse 108   Temp 98.4 F (36.9 C) (Oral)   Resp 16   Ht 5\' 7"  (1.702 m)   Wt 145 lb (65.8 kg)   SpO2 96%   BMI 22.71 kg/m   Physical Exam  Constitutional: She is oriented to person, place, and time. She is active.  Pt tearful  HENT:  Head: Atraumatic.  Eyes: Conjunctivae are normal. No scleral icterus.  Cardiovascular: Normal rate.   Pulmonary/Chest: Effort normal. No respiratory distress.  Abdominal: She exhibits no distension.  Musculoskeletal:  Left wrist diffusely ttp with no gross deformity or edema. Limited ROM of wrist. Normal ROM of fingers with normal finger thumb opposition. No snuffbox tenderness. Brisk cap refill x 5 and 2+ radial pulse. No c-spine, t-spine, or l-spine tenderness. Mild bilateral SI tenderness  Neurological: She is alert and oriented to person, place, and time.  Skin: Skin is warm and dry.  Psychiatric: She has a normal mood and affect. Her behavior is normal.  Nursing note and vitals reviewed.  ED Treatments / Results  DIAGNOSTIC STUDIES: Oxygen Saturation is 96% on RA, adequate by my  interpretation.    COORDINATION OF CARE: 9:43 PM Discussed treatment plan with pt at bedside which includes Zofran and pt agreed to plan.  Labs (all labs ordered are listed, but only abnormal results are displayed) Labs Reviewed - No data to display  EKG  EKG Interpretation None       Radiology Dg Sacrum/coccyx  Result Date: 01/20/2016 CLINICAL DATA:  Status post fall.  Sacral pain. EXAM: SACRUM AND COCCYX - 2+ VIEW COMPARISON:  None. FINDINGS: There is no evidence of fracture or other focal bone lesions. Sacroiliac joints are symmetric. Limited visualization of the hips shows no acute abnormality. IMPRESSION:  No fracture of the sacrum or coccyx. Electronically Signed   By: Ulyses Jarred M.D.   On: 01/20/2016 23:22   Dg Forearm Left  Result Date: 01/20/2016 CLINICAL DATA:  Status post fall.  Left arm pain. EXAM: LEFT WRIST - COMPLETE 3+ VIEW; LEFT FOREARM - 2 VIEW COMPARISON:  Left upper extremity radiographs 12/21/2014 FINDINGS: There is no evidence of fracture or dislocation. There is no evidence of arthropathy or other focal bone abnormality. Soft tissues are unremarkable. IMPRESSION: No acute fracture or dislocation. Electronically Signed   By: Ulyses Jarred M.D.   On: 01/20/2016 23:20   Dg Wrist Complete Left  Result Date: 01/20/2016 CLINICAL DATA:  Status post fall.  Left arm pain. EXAM: LEFT WRIST - COMPLETE 3+ VIEW; LEFT FOREARM - 2 VIEW COMPARISON:  Left upper extremity radiographs 12/21/2014 FINDINGS: There is no evidence of fracture or dislocation. There is no evidence of arthropathy or other focal bone abnormality. Soft tissues are unremarkable. IMPRESSION: No acute fracture or dislocation. Electronically Signed   By: Ulyses Jarred M.D.   On: 01/20/2016 23:20    Procedures Procedures (including critical care time)  Medications Ordered in ED Medications - No data to display   Initial Impression / Assessment and Plan / ED Course  I have reviewed the triage vital signs  and the nursing notes.  Pertinent labs & imaging results that were available during my care of the patient were reviewed by me and considered in my medical decision making (see chart for details).  Clinical Course    Imaging unremarkable. Pt otherwise neurovascularly intact. Will place in velcro wrist splint. Rx given for pain meds. Pt follows with Dr. Leitha Bleak of orthopedics. I encouraged f/u as soon as possible. ER return precautions given.  Final Clinical Impressions(s) / ED Diagnoses   Final diagnoses:  Left wrist sprain, initial encounter    New Prescriptions Discharge Medication List as of 01/20/2016 11:35 PM    START taking these medications   Details  oxyCODONE-acetaminophen (PERCOCET/ROXICET) 5-325 MG tablet Take 1 tablet by mouth every 4 (four) hours as needed for severe pain., Starting Thu 01/20/2016, Print       I personally performed the services described in this documentation, which was scribed in my presence. The recorded information has been reviewed and is accurate.    Anne Ng, PA-C 01/21/16 2100    Charlesetta Shanks, MD 01/25/16 [phone]

## 2016-01-20 NOTE — ED Triage Notes (Signed)
Patient BIB [facility] from home after a piece of exercise equipment fell on her hand. EMS reports no no visible deformity, swelling or bruising, EMS reports patient very upset and tearful, painful to touch. Upon initial assessment patient is very anxious and uncomfortable. Patient given 50 of fentanyl by EMS.

## 2016-01-20 NOTE — ED Notes (Signed)
Bed: WA06 Expected date:  Expected time:  Means of arrival:  Comments: EMS 61 yo female right wrist pain

## 2016-01-20 NOTE — ED Notes (Signed)
PA at bedside.

## 2016-01-20 NOTE — ED Notes (Signed)
Patient transported to X-ray 

## 2016-01-20 NOTE — Discharge Instructions (Signed)
Your x-rays were unremarkable today. Please follow up with Dr. Caralyn Guile when you return from your trip.

## 2016-01-21 ENCOUNTER — Other Ambulatory Visit (HOSPITAL_COMMUNITY): Payer: Self-pay | Admitting: Orthopedic Surgery

## 2016-01-21 ENCOUNTER — Emergency Department (HOSPITAL_COMMUNITY)
Admit: 2016-01-21 | Discharge: 2016-01-21 | Disposition: A | Discharge status: Other Acute Inpatient Hospital | Payer: BLUE CROSS/BLUE SHIELD

## 2016-01-21 ENCOUNTER — Encounter (HOSPITAL_COMMUNITY): Payer: Self-pay | Admitting: Emergency Medicine

## 2016-01-21 ENCOUNTER — Ambulatory Visit (HOSPITAL_COMMUNITY)
Admission: RE | Admit: 2016-01-21 | Discharge: 2016-01-21 | Disposition: A | Discharge status: Home / Self Care | Payer: BLUE CROSS/BLUE SHIELD | Source: Ambulatory Visit | Attending: Orthopedic Surgery | Admitting: Orthopedic Surgery

## 2016-01-21 DIAGNOSIS — M25532 Pain in left wrist: Secondary | ICD-10-CM | POA: Insufficient documentation

## 2016-01-21 DIAGNOSIS — W19XXXA Unspecified fall, initial encounter: Secondary | ICD-10-CM

## 2016-01-21 DIAGNOSIS — M25432 Effusion, left wrist: Secondary | ICD-10-CM

## 2016-01-21 DIAGNOSIS — S59202A Unspecified physeal fracture of lower end of radius, left arm, initial encounter for closed fracture: Secondary | ICD-10-CM

## 2016-01-21 NOTE — ED Notes (Signed)
Patient states she would rather have an ace wrap for her arm because she has a splint at home. Provider notified.

## 2016-01-21 NOTE — ED Triage Notes (Addendum)
Pt states she fell onto her wrrist yesterday and injured it, pt states I was seen at [facility] last night and had xray's there and "had soft tissue injury". Pt has sensation and movement intact. Radial pulse +3.  Pt states she was sent here for a MRI by fred otfman

## 2016-02-02 ENCOUNTER — Telehealth: Payer: Self-pay | Admitting: Emergency Medicine

## 2016-02-02 DIAGNOSIS — M7061 Trochanteric bursitis, right hip: Secondary | ICD-10-CM | POA: Diagnosis not present

## 2016-02-02 DIAGNOSIS — M7541 Impingement syndrome of right shoulder: Secondary | ICD-10-CM | POA: Diagnosis not present

## 2016-02-02 DIAGNOSIS — M7551 Bursitis of right shoulder: Secondary | ICD-10-CM | POA: Diagnosis not present

## 2016-02-02 DIAGNOSIS — S52552A Other extraarticular fracture of lower end of left radius, initial encounter for closed fracture: Secondary | ICD-10-CM | POA: Diagnosis not present

## 2016-02-02 NOTE — Telephone Encounter (Signed)
Please call patient. Be sure she follows up with the endocrinologist since interventional radiology is hesitant to biopsy these nodules. If the nodules are not biopsied they need to be followed very closely.

## 2016-02-03 ENCOUNTER — Telehealth: Payer: Self-pay | Admitting: Emergency Medicine

## 2016-02-03 NOTE — Telephone Encounter (Signed)
Please be sure she keeps her follow-up appointment with the endocrinologist. If these nodules are not biopsied they need very close follow-up. If she has any questions she can call me at [phone] thank you

## 2016-02-04 ENCOUNTER — Telehealth: Payer: Self-pay | Admitting: Emergency Medicine

## 2016-02-04 NOTE — Telephone Encounter (Signed)
Left message on voice mail to return call or call Dr. Everlene Farrier with questions or concerns

## 2016-02-07 NOTE — Telephone Encounter (Signed)
Advised pt of Dr Perfecto Kingdom instr's on her VM. Asked for CB with any ?s.

## 2016-02-08 ENCOUNTER — Ambulatory Visit: Payer: BLUE CROSS/BLUE SHIELD | Admitting: Endocrinology

## 2016-02-10 ENCOUNTER — Encounter: Payer: Self-pay | Admitting: Emergency Medicine

## 2016-02-11 NOTE — Telephone Encounter (Signed)
Beth Frey, I'll forward this message to you since you are handling Dr Perfecto Kingdom pt's for now. I'm not sure if anything needs to be done for now though, or just a FYI from pt.Thank you.

## 2016-02-12 ENCOUNTER — Other Ambulatory Visit: Payer: Self-pay | Admitting: Physician Assistant

## 2016-02-12 DIAGNOSIS — E042 Nontoxic multinodular goiter: Secondary | ICD-10-CM

## 2016-03-12 ENCOUNTER — Emergency Department (HOSPITAL_BASED_OUTPATIENT_CLINIC_OR_DEPARTMENT_OTHER)
Admission: EM | Admit: 2016-03-12 | Discharge: 2016-03-12 | Disposition: A | Discharge status: Home / Self Care | Payer: BLUE CROSS/BLUE SHIELD | Attending: Emergency Medicine | Admitting: Emergency Medicine

## 2016-03-12 ENCOUNTER — Emergency Department (HOSPITAL_BASED_OUTPATIENT_CLINIC_OR_DEPARTMENT_OTHER): Payer: BLUE CROSS/BLUE SHIELD

## 2016-03-12 ENCOUNTER — Encounter (HOSPITAL_BASED_OUTPATIENT_CLINIC_OR_DEPARTMENT_OTHER): Payer: Self-pay | Admitting: Emergency Medicine

## 2016-03-12 DIAGNOSIS — Z85828 Personal history of other malignant neoplasm of skin: Secondary | ICD-10-CM | POA: Insufficient documentation

## 2016-03-12 DIAGNOSIS — Z79899 Other long term (current) drug therapy: Secondary | ICD-10-CM | POA: Diagnosis not present

## 2016-03-12 DIAGNOSIS — M25551 Pain in right hip: Secondary | ICD-10-CM | POA: Diagnosis not present

## 2016-03-12 MED ORDER — IBUPROFEN 400 MG PO TABS
600.0000 mg | ORAL_TABLET | Freq: Once | ORAL | Status: AC
  Administered 2016-03-12: 600 mg via ORAL
  Filled 2016-03-12: qty 1

## 2016-03-12 MED ORDER — IBUPROFEN 600 MG PO TABS: 600.0000 mg | ORAL_TABLET | Freq: Four times a day (QID) | ORAL | 0 refills | Status: DC | PRN

## 2016-03-12 MED ORDER — HYDROCODONE-ACETAMINOPHEN 5-325 MG PO TABS
1.0000 | ORAL_TABLET | ORAL | 0 refills | Status: DC | PRN
Start: 2016-03-12 — End: 2016-07-19

## 2016-03-12 NOTE — ED Notes (Signed)
Patient transported to X-ray 

## 2016-03-12 NOTE — ED Triage Notes (Signed)
Pt in c/o R hip pain after walking up the steps and hearing a crack and having acute pain from hip. No fall, no injury. Pt alert, interactive, in NAD.

## 2016-03-12 NOTE — ED Provider Notes (Signed)
[facility] DEPT [facility] Provider Note   CSN: [id number] Arrival date & time: 03/12/16  1710  By signing my name below, I, Irene Pap, attest that this documentation has been prepared under the direction and in the presence of Harlene Ram[place], [place]. Electronically Signed: Irene Pap, ED Scribe. 03/12/16. 5:31 PM.  History   Chief Complaint Chief Complaint  Patient presents with  . Hip Pain   The history is provided by the patient. No language interpreter was used.   HPI Comments: Beth Frey is a 60 y.o. Female with a hx of osteoarthritis and skin cancer who presents to the Emergency Department complaining of sudden onset, constant, non-radiating, right hip pain onset 6 hours ago. Pt reports that she was walking down stairs when she heard a "crack/pop" come from her right hip and began to have pain immediately after. Denies falling. Pt says that she is able to bear weight on the right leg but reports experiencing a significant increase in pain. She denies hx of similar pain to the area. Pt has not taken anything for her symptoms today PTA. She does not take regular medications for her osteoarthritis. She denies fall or injury to the area, joint swelling, wound, back pain, bladder or bowel incontinence, saddle anesthesia, abdominal pain, numbness, or weakness. Pt is not on blood thinners. She is allergic to Sulfa antibiotics.   Past Medical History:  Diagnosis Date  . Anxiety   . Back pain, chronic   . Cancer ([place])    skin  . 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 Osteoarthritis     Patient Active Problem List   Diagnosis Date Noted  . Laxative abuse 07/28/2014  . Cellulitis 10/21/2013  . Cat bite of left lower leg 10/21/2013  . Septic arthritis of knee, left ([place]) 10/21/2013  . RLQ abdominal pain 11/13/2012  . Unspecified constipation 11/13/2012  . Hyperlipidemia 11/23/2011  . Vitamin D deficiency 11/23/2011  . History of abnormal cervical Pap smear 11/23/2011  . Anxiety disorder  11/23/2011  . Thyroid nodule 11/23/2011  . Skin cancer 11/23/2011  . Chronic hypokalemia 11/23/2011  . Incisional hernia 06/14/2011    Past Surgical History:  Procedure Laterality Date  . ABLATION SAPHENOUS VEIN W/ RFA    . BREAST SURGERY  August 2011   Implants removed  . CHOLECYSTECTOMY    . COLON SURGERY  02/20/2010   Right hemicolectomy-cecal bascule  . ELBOW SURGERY     left elbow  . JOINT REPLACEMENT  2006   Left total knee replacement  . OVARIAN CYST REMOVAL     right  . SKIN CANCER EXCISION     multiple-SCC and BCC  . TUBAL LIGATION    . Uterine ablation    . WRIST SURGERY  2009   right wrist    OB History    No data available     Home Medications    Prior to Admission medications   Medication Sig Start Date End Date Taking? Authorizing Provider  cloNIDine (CATAPRES) 0.2 MG tablet TAKE 1 TABLET BY MOUTH THREE TIMES A DAY patient may take an extra pill if blood pressure spikes. Patient taking differently: Take 0.2 mg by mouth 3 (three) times daily. patient may take an extra pill if blood pressure spikes. 09/14/15   Darlyne Russian, MD  FLUoxetine (PROZAC) 10 MG tablet Take 1 tablet daily for the first week then 2 tablets daily. Patient taking differently: Take 20 mg by mouth daily.  09/14/15   Darlyne Russian, MD  HYDROcodone-acetaminophen (NORCO/VICODIN) 5-325 MG tablet Take 1 tablet by mouth every 4 (four) hours as needed. 03/12/16   Nona Dell, PA-C  ibuprofen (ADVIL,MOTRIN) 600 MG tablet Take 1 tablet (600 mg total) by mouth every 6 (six) hours as needed. 03/12/16   Nona Dell, PA-C  LORazepam (ATIVAN) 1 MG tablet take 1 tablet by mouth every 4 hours if needed for anxiety Patient taking differently: Take 1 mg by mouth three times daily 01/01/16   Darlyne Russian, MD  mometasone (NASONEX) 50 MCG/ACT nasal spray Place 2 sprays into the nose daily as needed (allergies).     Historical Provider, MD  ondansetron (ZOFRAN-ODT) 8 MG disintegrating tablet  dissolve 1 tablet ON TONGUE every 8 hours if needed 04/12/15   Elby Beck, FNP  oxyCODONE-acetaminophen (PERCOCET/ROXICET) 5-325 MG tablet Take 1 tablet by mouth every 4 (four) hours as needed for severe pain. 01/20/16   Olivia Canter Sam, PA-C  polyethylene glycol (MIRALAX / GLYCOLAX) packet Take 17 g by mouth daily as needed for mild constipation or moderate constipation.     Historical Provider, MD  potassium chloride SA (K-DUR,KLOR-CON) 20 MEQ tablet Take 2 tablets in the morning 2 tablets at night for a total daily dose of 80 mEq Patient taking differently: Take 40 mEq by mouth 2 (two) times daily.  09/15/15   Darlyne Russian, MD  Vitamin D, Ergocalciferol, (DRISDOL) 50000 UNITS CAPS capsule TAKE 1 CAPSULE BY MOUTH EVERY 7 DAYS Patient taking differently: Take 50,000 Units by mouth every 7 (seven) days.  03/20/14   Harrison Mons, PA-C    Family History Family History  Problem Relation Age of Onset  . Cancer Father     stomach cancer  . Cancer Brother     pancreatic cancer  . Stroke Maternal Grandmother   . Cancer Maternal Grandfather     lung cancer  . Cancer Paternal Grandmother     ovarian  . Heart disease Paternal Grandfather     Social History Social History  Substance Use Topics  . Smoking status: Never Smoker  . Smokeless tobacco: Never Used  . Alcohol use No     Allergies   Sulfa antibiotics and Doxycycline   Review of Systems Review of Systems  Musculoskeletal: Positive for arthralgias. Negative for joint swelling.  Skin: Negative for wound.  Neurological: Negative for weakness and numbness.  All other systems reviewed and are negative.   Physical Exam Updated Vital Signs BP 120/93 (BP Location: Right Arm)   Temp 98.3 F (36.8 C) (Oral)   Resp 18   Ht 5\' 8"  (1.727 m)   Wt 63.5 kg   SpO2 100%   BMI 21.29 kg/m   Physical Exam  Constitutional: She is oriented to person, place, and time. She appears well-developed and well-nourished. No distress.    HENT:  Head: Normocephalic and atraumatic.  Eyes: Conjunctivae and EOM are normal. Right eye exhibits no discharge. Left eye exhibits no discharge. No scleral icterus.  Neck: Normal range of motion. Neck supple.  Cardiovascular: Normal rate, regular rhythm, normal heart sounds and intact distal pulses.   HR 96  Pulmonary/Chest: Effort normal and breath sounds normal. No respiratory distress.  Abdominal: Soft. Bowel sounds are normal. She exhibits no distension. There is no tenderness.  Musculoskeletal: Normal range of motion. She exhibits tenderness. She exhibits no edema or deformity.       Right hip: She exhibits tenderness (right lateral hip). She exhibits normal range of motion, normal strength, no  swelling, no crepitus, no deformity and no laceration.  No midline C, T, or L tenderness. Full range of motion of neck and back. Full range of motion of bilateral upper and lower extremities, with 5/5 strength. Sensation intact. 2+ radial and PT pulses. Cap refill <2 seconds. Patient able to stand and ambulate without assistance but endorses pain.   TTP of right lateral hip which pt reports is worse with external rotation. No tenderness to right femur or knee. No swelling, erythema, abrasion or ecchymoses noted. No obvious deformity  Neurological: She is alert and oriented to person, place, and time. She has normal strength. No sensory deficit. Gait normal.  Skin: Skin is warm and dry. Capillary refill takes less than 2 seconds. She is not diaphoretic.  Nursing note and vitals reviewed.  ED Treatments / Results  DIAGNOSTIC STUDIES: Oxygen Saturation is 100% on RA, normal by my interpretation.    COORDINATION OF CARE: 5:27 PM-Discussed treatment plan which includes x-ray with pt at bedside and pt agreed to plan.    Labs (all labs ordered are listed, but only abnormal results are displayed) Labs Reviewed - No data to display  EKG  EKG Interpretation None       Radiology Dg Hip  Unilat W Or Wo Pelvis 2-3 Views Right  Result Date: 03/12/2016 CLINICAL DATA:  Initial evaluation for acute onset right hip pain. EXAM: DG HIP (WITH OR WITHOUT PELVIS) 2-3V RIGHT COMPARISON:  None. FINDINGS: No acute fracture or dislocation. Femoral heads in normal lie within the acetabula. Femoral head heights preserved. Moderate degenerative osteoporosis about the hips bilaterally. Bony pelvis intact. No soft tissue abnormality. IMPRESSION: 1. No acute fracture or dislocation. 2. Moderate degenerative osteoporosis about the hips bilaterally. Electronically Signed   By: Jeannine Boga M.D.   On: 03/12/2016 18:28    Procedures Procedures (including critical care time)  Medications Ordered in ED Medications  ibuprofen (ADVIL,MOTRIN) tablet 600 mg (600 mg Oral Given 03/12/16 1739)     Initial Impression / Assessment and Plan / ED Course  I have reviewed the triage vital signs and the nursing notes.  Pertinent labs & imaging results that were available during my care of the patient were reviewed by me and considered in my medical decision making (see chart for details).  Clinical Course     Patient presents with right hip pain that occurred while walking down stairs earlier this afternoon. Denies fall or blunt trauma. Pain worse with movement of right hip or when bearing weight. VSS. Exam revealed tenderness over right lateral hip, no tenderness over right trochanteric bursa. Full range of motion of right hip with reported pain with full external rotation. No midline spinal tenderness. Right large semi-neurovascular intact. Pt is able ambulate but endorses pain, no bony abnormality or deformity, no erythema or excessive heat, no evidence of cellulitis, DVT, or septic joint. Right hip x-ray revealed no acute fracture dislocation, moderate degenerative osteoporosis noted in bilateral hips. Discussed results and plan for discharge with patient. Plan to discharge patient home with pain meds,  symptomatic treatment and cryotherapy. Advised patient to follow up with her PCP if her symptoms have not improved over the next week. Discussed return precautions.  Final Clinical Impressions(s) / ED Diagnoses   Final diagnoses:  Right hip pain   I personally performed the services described in this documentation, which was scribed in my presence. The recorded information has been reviewed and is accurate.   New Prescriptions New Prescriptions   HYDROCODONE-ACETAMINOPHEN (NORCO/VICODIN) 5-325  MG TABLET    Take 1 tablet by mouth every 4 (four) hours as needed.   IBUPROFEN (ADVIL,MOTRIN) 600 MG TABLET    Take 1 tablet (600 mg total) by mouth every 6 (six) hours as needed.     Chesley Noon [place], [place] 03/12/16 1847    Fatima Blank, MD 03/13/16 1321

## 2016-03-12 NOTE — Discharge Instructions (Signed)
Take your medications as prescribed as needed for pain relief. I also recommend continuing to rest and apply ice to affected area for 15 minutes 3-4 times daily. I recommend refrain from doing any heavy lifting, repetitive or exacerbating movements that worsens her pain for the next few days. Follow-up with your primary care provider within the next week if your pain has not improved. Please return to the Emergency Department if symptoms worsen or new onset of fever, back pain, swelling, numbness, tingling, weakness, loss of control of bowel or bladder, groin numbness, urinary retention, decreased range of motion.

## 2016-03-12 NOTE — ED Notes (Signed)
States was at church and took a step and felt her right hip pop and has had pain since this afternoon.Pain worse with wt bearing, drove to ED

## 2016-03-15 DIAGNOSIS — M7061 Trochanteric bursitis, right hip: Secondary | ICD-10-CM | POA: Diagnosis not present

## 2016-03-18 DIAGNOSIS — M7061 Trochanteric bursitis, right hip: Secondary | ICD-10-CM | POA: Diagnosis not present

## 2016-05-26 ENCOUNTER — Other Ambulatory Visit: Payer: Self-pay | Admitting: Emergency Medicine

## 2016-05-26 NOTE — Telephone Encounter (Signed)
Needs OV for any additional refills. Ok to call in #90, same sig, no refills to give pt time to make an appointment.

## 2016-05-26 NOTE — Telephone Encounter (Signed)
12/2015 last refill #120 with 5 refills 09/2015 last ov shaw

## 2016-05-28 NOTE — Telephone Encounter (Signed)
Was this called in and pt called to inform her that she needs an appt? Thanks!

## 2016-05-31 ENCOUNTER — Other Ambulatory Visit: Payer: Self-pay | Admitting: Family Medicine

## 2016-05-31 DIAGNOSIS — R112 Nausea with vomiting, unspecified: Secondary | ICD-10-CM

## 2016-05-31 NOTE — Telephone Encounter (Signed)
Was this called in and pt called to inform her that she needs an appt? Thanks!

## 2016-06-02 MED ORDER — LORAZEPAM 1 MG PO TABS: ORAL_TABLET | ORAL | 0 refills | Status: DC

## 2016-06-02 NOTE — Addendum Note (Signed)
Addended by: Virgia Land on: 06/02/2016 12:46 PM   Modules accepted: Orders

## 2016-06-02 NOTE — Telephone Encounter (Signed)
Called to pharmacy and advised pt needs ov before refills

## 2016-06-13 DIAGNOSIS — M654 Radial styloid tenosynovitis [de Quervain]: Secondary | ICD-10-CM | POA: Diagnosis not present

## 2016-06-13 DIAGNOSIS — M7061 Trochanteric bursitis, right hip: Secondary | ICD-10-CM | POA: Diagnosis not present

## 2016-06-13 DIAGNOSIS — S63094D Other dislocation of right wrist and hand, subsequent encounter: Secondary | ICD-10-CM | POA: Diagnosis not present

## 2016-07-06 DIAGNOSIS — L820 Inflamed seborrheic keratosis: Secondary | ICD-10-CM | POA: Diagnosis not present

## 2016-07-06 DIAGNOSIS — L570 Actinic keratosis: Secondary | ICD-10-CM | POA: Diagnosis not present

## 2016-07-06 DIAGNOSIS — Z85828 Personal history of other malignant neoplasm of skin: Secondary | ICD-10-CM | POA: Diagnosis not present

## 2016-07-10 ENCOUNTER — Other Ambulatory Visit: Payer: Self-pay | Admitting: Family Medicine

## 2016-07-12 ENCOUNTER — Encounter: Payer: BLUE CROSS/BLUE SHIELD | Admitting: Family Medicine

## 2016-07-15 ENCOUNTER — Other Ambulatory Visit: Payer: Self-pay | Admitting: Family Medicine

## 2016-07-19 ENCOUNTER — Encounter: Payer: Self-pay | Admitting: Family Medicine

## 2016-07-19 ENCOUNTER — Ambulatory Visit (INDEPENDENT_AMBULATORY_CARE_PROVIDER_SITE_OTHER): Payer: BLUE CROSS/BLUE SHIELD | Admitting: Family Medicine

## 2016-07-19 VITALS — BP 116/78 | HR 88 | Temp 98.3°F | Ht 68.0 in | Wt 146.2 lb

## 2016-07-19 DIAGNOSIS — F411 Generalized anxiety disorder: Secondary | ICD-10-CM | POA: Diagnosis not present

## 2016-07-19 DIAGNOSIS — F4323 Adjustment disorder with mixed anxiety and depressed mood: Secondary | ICD-10-CM | POA: Diagnosis not present

## 2016-07-19 DIAGNOSIS — M816 Localized osteoporosis [Lequesne]: Secondary | ICD-10-CM

## 2016-07-19 DIAGNOSIS — I10 Essential (primary) hypertension: Secondary | ICD-10-CM | POA: Diagnosis not present

## 2016-07-19 DIAGNOSIS — Z636 Dependent relative needing care at home: Secondary | ICD-10-CM

## 2016-07-19 DIAGNOSIS — E041 Nontoxic single thyroid nodule: Secondary | ICD-10-CM

## 2016-07-19 DIAGNOSIS — Z0000 Encounter for general adult medical examination without abnormal findings: Secondary | ICD-10-CM

## 2016-07-19 DIAGNOSIS — E876 Hypokalemia: Secondary | ICD-10-CM

## 2016-07-19 DIAGNOSIS — E559 Vitamin D deficiency, unspecified: Secondary | ICD-10-CM | POA: Diagnosis not present

## 2016-07-19 DIAGNOSIS — T07XXXA Unspecified multiple injuries, initial encounter: Secondary | ICD-10-CM

## 2016-07-19 MED ORDER — LORAZEPAM 1 MG PO TABS: ORAL_TABLET | ORAL | 0 refills | Status: DC

## 2016-07-19 MED ORDER — CLONIDINE HCL 0.2 MG PO TABS: 0.2000 mg | ORAL_TABLET | Freq: Three times a day (TID) | ORAL | 1 refills | Status: DC

## 2016-07-19 NOTE — Patient Instructions (Addendum)
Vitamin D Deficiency Vitamin D deficiency is when your body does not have enough vitamin D. Vitamin D is important because:  It helps your body use other minerals that your body needs.  It helps keep your bones strong and healthy.  It may help to prevent some diseases.  It helps your heart and other muscles work well. You can get vitamin D by:  Eating foods with vitamin D in them.  Drinking or eating milk or other foods that have had vitamin D added to them.  Taking a vitamin D supplement.  Being in the sun. Not getting enough vitamin D can make your bones become soft. It can also cause other health problems. Follow these instructions at home:  Take medicines and supplements only as told by your doctor.  Eat foods that have vitamin D. These include:  Dairy products, cereals, or juices with added vitamin D. Check the label for vitamin D.  Fatty fish like salmon or trout.  Eggs.  Oysters.  Do not use tanning beds.  Stay at a healthy weight. Lose weight, if needed.  Keep all follow-up visits as told by your doctor. This is important. Contact a doctor if:  Your symptoms do not go away.  You feel sick to your stomach (nauseous).  Youthrow up (vomit).  You poop less often than usual or you have trouble pooping (constipation). This information is not intended to replace advice given to you by your health care provider. Make sure you discuss any questions you have with your health care provider. Document Released: 04/13/2011 Document Revised: 09/30/2015 Document Reviewed: 09/09/2014 Elsevier Interactive Patient Education  2017 [place].   Osteoporosis Osteoporosis is the thinning and loss of density in the bones. Osteoporosis makes the bones more brittle, fragile, and likely to break (fracture). Over time, osteoporosis can cause the bones to become so weak that they fracture after a simple fall. The bones most likely to fracture are the bones in the hip, wrist, and  spine. What are the causes? The exact cause is not known. What increases the risk? Anyone can develop osteoporosis. You may be at greater risk if you have a family history of the condition or have poor nutrition. You may also have a higher risk if you are:  Female.  10 years old or older.  A smoker.  Not physically active.  White or Asian.  Slender. What are the signs or symptoms? A fracture might be the first sign of the disease, especially if it results from a fall or injury that would not usually cause a bone to break. Other signs and symptoms include:  Low back and neck pain.  Stooped posture.  Height loss. How is this diagnosed? To make a diagnosis, your health care provider may:  Take a medical history.  Perform a physical exam.  Order tests, such as:  A bone mineral density test.  A dual-energy X-ray absorptiometry test. How is this treated? The goal of osteoporosis treatment is to strengthen your bones to reduce your risk of a fracture. Treatment may involve:  Making lifestyle changes, such as:  Eating a diet rich in calcium.  Doing weight-bearing and muscle-strengthening exercises.  Stopping tobacco use.  Limiting alcohol intake.  Taking medicine to slow the process of bone loss or to increase bone density.  Monitoring your levels of calcium and vitamin D. Follow these instructions at home:  Include calcium and vitamin D in your diet. Calcium is important for bone health, and vitamin D helps  the body absorb calcium.  Perform weight-bearing and muscle-strengthening exercises as directed by your health care provider.  Do not use any tobacco products, including cigarettes, chewing tobacco, and electronic cigarettes. If you need help quitting, ask your health care provider.  Limit your alcohol intake.  Take medicines only as directed by your health care provider.  Keep all follow-up visits as directed by your health care provider. This is  important.  Take precautions at home to lower your risk of falling, such as:  Keeping rooms well lit and clutter free.  Installing safety rails on stairs.  Using rubber mats in the bathroom and other areas that are often wet or slippery. Get help right away if: You fall or injure yourself. This information is not intended to replace advice given to you by your health care provider. Make sure you discuss any questions you have with your health care provider. Document Released: 02/01/2005 Document Revised: 09/27/2015 Document Reviewed: 10/02/2013 Elsevier Interactive Patient Education  2017 [employer].

## 2016-07-19 NOTE — Progress Notes (Signed)
Chief Complaint  Patient presents with  . Annual Exam    CPA    Subjective:  Beth Frey is a 61 y.o. female here for a health maintenance visit.  Patient is established pt  Osteoporosis: Patient complains of loss of height and reports that her bones are bad. She was diagnosed with osteoporosis by bone density scan in 03/2016. Patient admits to history of fracture.The cause of osteoporosis is felt to be due to postmenopausal estrogen deficiency, dietary calcium and/or vitamin D deficiency, glucocorticoid use, amenorrhea and hereditary factors.   She is not currently being treated with calcium and vitamin D supplementation.  She is not currently being treated with bisphosphonates  Osteoporosis Risk Factors  Nonmodifiable Personal Hx of fracture as an adult: yes - multiple fractures Hx of fracture in first-degree relative: did not ask Caucasian race: yes Advanced age: yes Female sex: yes Dementia: no Poor health/frailty: yes - pt is thin and has a history of multiple joint replacements  Potentially modifiable: Tobacco use: no Low body weight (<127 lbs): no Estrogen deficiency  early menopause (age <45) or bilateral ovariectomy: no  prolonged premenopausal amenorrhea (>1 yr): no Low calcium intake (lifelong): yes Alcoholism: did not ask Recurrent falls: yes Inadequate physical activity: no  Current calcium and Vit D intake: Dietary sources: limited intake Supplements: stopped taking   Psychiatric  Pt has a history of requiring clonidine and ativan She reports taking ativan up to every 4 hours  On the day that are "bad" when she gets anxious and has the shakes.  She reports that her bestfriend died. 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 She reports that she is stressed and has some anxiety  Patient Active Problem List   Diagnosis Date Noted  . Laxative abuse 07/28/2014  . Cellulitis 10/21/2013  . Cat bite of left lower leg 10/21/2013  .  Septic arthritis of knee, left ([facility]) 10/21/2013  . RLQ abdominal pain 11/13/2012  . Unspecified constipation 11/13/2012  . Carcinoma in situ of skin of eyelid including canthus 06/26/2012  . Hyperlipidemia 11/23/2011  . Vitamin D deficiency 11/23/2011  . History of abnormal cervical Pap smear 11/23/2011  . Anxiety disorder 11/23/2011  . Thyroid nodule 11/23/2011  . Skin cancer 11/23/2011  . Chronic hypokalemia 11/23/2011  . Incisional hernia 06/14/2011  . Personal history of other malignant neoplasm of skin 02/02/2011    Past Medical History:  Diagnosis Date  . Anxiety   . Back pain, chronic   . Cancer ([place])    skin  . 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 Osteoarthritis     Past Surgical History:  Procedure Laterality Date  . ABLATION SAPHENOUS VEIN W/ RFA    . BREAST SURGERY  August 2011   Implants removed  . CHOLECYSTECTOMY    . COLON SURGERY  02/20/2010   Right hemicolectomy-cecal bascule  . ELBOW SURGERY     left elbow  . JOINT REPLACEMENT  2006   Left total knee replacement  . OVARIAN CYST REMOVAL     right  . SKIN CANCER EXCISION     multiple-SCC and BCC  . TUBAL LIGATION    . Uterine ablation    . WRIST SURGERY  2009   right wrist     Outpatient Medications Prior to Visit  Medication Sig Dispense Refill  . mometasone (NASONEX) 50 MCG/ACT nasal spray Place 2 sprays into the nose daily as needed (allergies).     . ondansetron (ZOFRAN-ODT) 8  MG disintegrating tablet dissolve 1 tablet ON TONGUE every 8 hours if needed 30 tablet 0  . polyethylene glycol (MIRALAX / GLYCOLAX) packet Take 17 g by mouth daily as needed for mild constipation or moderate constipation.     . cloNIDine (CATAPRES) 0.2 MG tablet TAKE 1 TABLET BY MOUTH THREE TIMES A DAY patient may take an extra pill if blood pressure spikes. (Patient taking differently: Take 0.2 mg by mouth 3 (three) times daily. patient may take an extra pill if blood pressure spikes.) 120 tablet 11  . HYDROcodone-acetaminophen  (NORCO/VICODIN) 5-325 MG tablet Take 1 tablet by mouth every 4 (four) hours as needed. 10 tablet 0  . ibuprofen (ADVIL,MOTRIN) 600 MG tablet Take 1 tablet (600 mg total) by mouth every 6 (six) hours as needed. 30 tablet 0  . LORazepam (ATIVAN) 1 MG tablet take 1 tablet by mouth every 4 hours if needed for anxiety 90 tablet 0  . potassium chloride SA (K-DUR,KLOR-CON) 20 MEQ tablet Take 2 tablets in the morning 2 tablets at night for a total daily dose of 80 mEq (Patient taking differently: Take 40 mEq by mouth 2 (two) times daily. ) 120 tablet 11  . FLUoxetine (PROZAC) 10 MG tablet Take 1 tablet daily for the first week then 2 tablets daily. (Patient not taking: Reported on 07/19/2016) 60 tablet 11  . oxyCODONE-acetaminophen (PERCOCET/ROXICET) 5-325 MG tablet Take 1 tablet by mouth every 4 (four) hours as needed for severe pain. (Patient not taking: Reported on 07/19/2016) 20 tablet 0  . Vitamin D, Ergocalciferol, (DRISDOL) 50000 UNITS CAPS capsule TAKE 1 CAPSULE BY MOUTH EVERY 7 DAYS (Patient not taking: Reported on 07/19/2016) 4 capsule 0   No facility-administered medications prior to visit.     Allergies  Allergen Reactions  . Sulfa Antibiotics Itching and Swelling    Throat, body swelling  . Doxycycline     Sun      Family History  Problem Relation Age of Onset  . Cancer Father     stomach cancer  . Cancer Brother     pancreatic cancer  . Stroke Maternal Grandmother   . Cancer Maternal Grandfather     lung cancer  . Cancer Paternal Grandmother     ovarian  . Heart disease Paternal Grandfather      Health Habits: Dental Exam: up to date Eye Exam: not up to date Exercise: 2-3 times/week on average Current exercise activities: walking/running Diet: balanced  Social History   Social History  . Marital status: Married    Spouse name: N/A  . Number of children: N/A  . Years of education: N/A   Occupational History  . Not on file.   Social History Main Topics  .  Smoking status: Never Smoker  . Smokeless tobacco: Never Used  . Alcohol use No  . Drug use: No  . Sexual activity: Not on file     Comment: married   Other Topics Concern  . Not on file   Social History Narrative  . No narrative on file   History  Alcohol Use No   History  Smoking Status  . Never Smoker  Smokeless Tobacco  . Never Used   History  Drug Use No    GYN: Sexual Health Menstrual status: absent LMP: No LMP recorded. Patient is postmenopausal. Last pap smear: see HM section History of abnormal pap smears:     Health Maintenance: See under health Maintenance activity for review of completion dates as well. Immunization History  Administered Date(s) Administered  . Tdap 10/20/2013      Depression Screen-PHQ2/9 Depression screen [facility] 2/9 07/19/2016 09/24/2015 09/14/2015 04/12/2015 01/07/2015  Decreased Interest 0 0 1 0 0  Down, Depressed, Hopeless 0 0 1 0 0  PHQ - 2 Score 0 0 2 0 0  Altered sleeping - - 1 - -  Tired, decreased energy - - 1 - -  Change in appetite - - 0 - -  Feeling bad or failure about yourself  - - 1 - -  Trouble concentrating - - 0 - -  Moving slowly or fidgety/restless - - 1 - -  Suicidal thoughts - - 0 - -  PHQ-9 Score - - 6 - -  Difficult doing work/chores - - Somewhat difficult - -      Depression Severity and Treatment Recommendations:  0-4= None  5-9= Mild / Treatment: Support, educate to call if worse; return in one month  10-14= Moderate / Treatment: Support, watchful waiting; Antidepressant or Psycotherapy  15-19= Moderately severe / Treatment: Antidepressant OR Psychotherapy  >= 20 = Major depression, severe / Antidepressant AND Psychotherapy    Review of Systems   Review of Systems  Constitutional: Negative for chills, fever and weight loss.  HENT: Negative for ear discharge, ear pain, hearing loss and tinnitus.   Respiratory: Negative for cough, shortness of breath and wheezing.   Cardiovascular: Negative for  chest pain, palpitations, orthopnea and leg swelling.  Gastrointestinal: Positive for constipation. Negative for abdominal pain, nausea and vomiting.  Genitourinary: Negative for dysuria, frequency and urgency.  Skin: Negative for itching and rash.       bruising all over the skin  Neurological: Negative for dizziness, tingling and headaches.  Psychiatric/Behavioral: Negative for depression. The patient is nervous/anxious. The patient does not have insomnia.        Several family members and best friend died so pt has been grieving    See HPI for ROS as well.    Objective:   Vitals:   07/19/16 1056  BP: 116/78  Pulse: 88  Temp: 98.3 F (36.8 C)  TempSrc: Oral  SpO2: 99%  Weight: 146 lb 3.2 oz (66.3 kg)  Height: 5\' 8"  (1.727 m)    Body mass index is 22.23 kg/m.  Physical Exam  Constitutional: She is oriented to person, place, and time. She appears well-developed and well-nourished.  HENT:  Head: Atraumatic.  Right Ear: External ear normal.  Left Ear: External ear normal.  Eyes: Conjunctivae and EOM are normal. Pupils are equal, round, and reactive to light.  Neck: Normal range of motion. Neck supple.  Cardiovascular: Normal rate, regular rhythm, normal heart sounds and intact distal pulses.   No murmur heard. Pulmonary/Chest: Effort normal and breath sounds normal. No respiratory distress. She has no wheezes. She has no rales. She exhibits no tenderness.  Abdominal: Bowel sounds are normal. She exhibits no distension and no mass. There is no tenderness. There is no rebound and no guarding.  Musculoskeletal: Normal range of motion. She exhibits no edema or deformity.  Neurological: She is alert and oriented to person, place, and time. She has normal reflexes.  Skin: Skin is warm. No erythema.  Psychiatric: She has a normal mood and affect. Her behavior is normal. Judgment and thought content normal.    CLINICAL DATA:  Initial evaluation for acute onset right hip  pain.  EXAM: DG HIP (WITH OR WITHOUT PELVIS) 2-3V RIGHT  COMPARISON:  None.  FINDINGS: No acute fracture or dislocation.  Femoral heads in normal lie within the acetabula. Femoral head heights preserved. Moderate degenerative osteoporosis about the hips bilaterally. Bony pelvis intact. No soft tissue abnormality.  IMPRESSION: 1. No acute fracture or dislocation. 2. Moderate degenerative osteoporosis about the hips bilaterally.   Electronically Signed   By: Jeannine Boga M.D.   On: 03/12/2016 18:28   Assessment/Plan:   Patient was seen for a health maintenance exam.  Counseled the patient on health maintenance issues. Reviewed her health mainteance schedule and ordered appropriate tests (see orders.) Counseled on regular exercise and weight management. Recommend regular eye exams and dental cleaning.   The following issues were addressed today for health maintenance:   Emanie was seen today for annual exam.  Diagnoses and all orders for this visit:  Encounter for health maintenance examination in adult- age appropriate health maintenance reviewed -     Lipid panel -     Comprehensive metabolic panel -     CBC with Differential/Platelet  Hypokalemia- will check cmp  Vitamin D deficiency- advised to follow up with Endocrinology to discuss ways to improve her bone breakdown and resorption  Will get vit d levels and PTH today as well as renal function -     VITAMIN D 25 Hydroxy (Vit-D Deficiency, Fractures) -     Parathyroid hormone, intact (no Ca) -     Ambulatory referral to Endocrinology  Thyroid nodule- will check thyroid function -     TSH -     T4, Free  Generalized anxiety disorder -     cloNIDine (CATAPRES) 0.2 MG tablet; Take 1 tablet (0.2 mg total) by mouth 3 (three) times daily. patient may take an extra pill if blood pressure spikes. -     Ambulatory referral to Psychiatry  Essential hypertension, benign -     cloNIDine (CATAPRES) 0.2 MG  tablet; Take 1 tablet (0.2 mg total) by mouth 3 (three) times daily. patient may take an extra pill if blood pressure spikes.  Adjustment disorder with mixed anxiety and depressed mood Caregiver stress -   Discussed with patient that she is on VERY high doses of ativan  She is also on clonidine Will refill her meds so that she does not withdraw but discussed that she should follow up with Psychiatry to discuss her benzodiazepine dependence and her ongoing adjustment disorder Explained that she should also try cognitive behavioral therapy and low impact exercise -     Ambulatory referral to Psychiatry  Localized osteoporosis without current pathological fracture- discussed  -     Parathyroid hormone, intact (no Ca) -     Ambulatory referral to Endocrinology  Multiple fractures- will check pt and renal function -     Parathyroid hormone, intact (no Ca)  Other orders -     LORazepam (ATIVAN) 1 MG tablet; take 1 tablet by mouth every 4 hours if needed for anxiety   This patient was advised to follow up with her referrals especially Psychiatry. There is not plan for any further refills of ativan from this provider. [facility] was reviewed and there were no aberrant refills.   Follow up TBD   Body mass index is 22.23 kg/m.:  Discussed the patient's BMI with patient. The BMI body mass index is 22.23 kg/m.     No future appointments.  Patient Instructions  Vitamin D Deficiency Vitamin D deficiency is when your body does not have enough vitamin D. Vitamin D is important because:  It helps your body use other minerals that  your body needs.  It helps keep your bones strong and healthy.  It may help to prevent some diseases.  It helps your heart and other muscles work well. You can get vitamin D by:  Eating foods with vitamin D in them.  Drinking or eating milk or other foods that have had vitamin D added to them.  Taking a vitamin D supplement.  Being in the sun. Not getting  enough vitamin D can make your bones become soft. It can also cause other health problems. Follow these instructions at home:  Take medicines and supplements only as told by your doctor.  Eat foods that have vitamin D. These include:  Dairy products, cereals, or juices with added vitamin D. Check the label for vitamin D.  Fatty fish like salmon or trout.  Eggs.  Oysters.  Do not use tanning beds.  Stay at a healthy weight. Lose weight, if needed.  Keep all follow-up visits as told by your doctor. This is important. Contact a doctor if:  Your symptoms do not go away.  You feel sick to your stomach (nauseous).  Youthrow up (vomit).  You poop less often than usual or you have trouble pooping (constipation). This information is not intended to replace advice given to you by your health care provider. Make sure you discuss any questions you have with your health care provider. Document Released: 04/13/2011 Document Revised: 09/30/2015 Document Reviewed: 09/09/2014 Elsevier Interactive Patient Education  2017 [place].   Osteoporosis Osteoporosis is the thinning and loss of density in the bones. Osteoporosis makes the bones more brittle, fragile, and likely to break (fracture). Over time, osteoporosis can cause the bones to become so weak that they fracture after a simple fall. The bones most likely to fracture are the bones in the hip, wrist, and spine. What are the causes? The exact cause is not known. What increases the risk? Anyone can develop osteoporosis. You may be at greater risk if you have a family history of the condition or have poor nutrition. You may also have a higher risk if you are:  Female.  34 years old or older.  A smoker.  Not physically active.  White or Asian.  Slender. What are the signs or symptoms? A fracture might be the first sign of the disease, especially if it results from a fall or injury that would not usually cause a bone to  break. Other signs and symptoms include:  Low back and neck pain.  Stooped posture.  Height loss. How is this diagnosed? To make a diagnosis, your health care provider may:  Take a medical history.  Perform a physical exam.  Order tests, such as:  A bone mineral density test.  A dual-energy X-ray absorptiometry test. How is this treated? The goal of osteoporosis treatment is to strengthen your bones to reduce your risk of a fracture. Treatment may involve:  Making lifestyle changes, such as:  Eating a diet rich in calcium.  Doing weight-bearing and muscle-strengthening exercises.  Stopping tobacco use.  Limiting alcohol intake.  Taking medicine to slow the process of bone loss or to increase bone density.  Monitoring your levels of calcium and vitamin D. Follow these instructions at home:  Include calcium and vitamin D in your diet. Calcium is important for bone health, and vitamin D helps the body absorb calcium.  Perform weight-bearing and muscle-strengthening exercises as directed by your health care provider.  Do not use any tobacco products, including cigarettes, chewing tobacco, and  electronic cigarettes. If you need help quitting, ask your health care provider.  Limit your alcohol intake.  Take medicines only as directed by your health care provider.  Keep all follow-up visits as directed by your health care provider. This is important.  Take precautions at home to lower your risk of falling, such as:  Keeping rooms well lit and clutter free.  Installing safety rails on stairs.  Using rubber mats in the bathroom and other areas that are often wet or slippery. Get help right away if: You fall or injure yourself. This information is not intended to replace advice given to you by your health care provider. Make sure you discuss any questions you have with your health care provider. Document Released: 02/01/2005 Document Revised: 09/27/2015 Document  Reviewed: 10/02/2013 Elsevier Interactive Patient Education  2017 [employer].

## 2016-07-20 ENCOUNTER — Other Ambulatory Visit: Payer: Self-pay | Admitting: Family Medicine

## 2016-07-20 LAB — LIPID PANEL
Chol/HDL Ratio: 4.9 ratio units — ABNORMAL HIGH (ref 0.0–4.4)
Cholesterol, Total: 316 mg/dL — ABNORMAL HIGH (ref 100–199)
HDL: 64 mg/dL (ref >39)
LDL Calculated: 215 mg/dL — ABNORMAL HIGH (ref 0–99)
Triglycerides: 187 mg/dL — ABNORMAL HIGH (ref 0–149)
VLDL CHOLESTEROL CAL: 37 mg/dL (ref 5–40)

## 2016-07-20 LAB — COMPREHENSIVE METABOLIC PANEL
A/G RATIO: 1.6 (ref 1.2–2.2)
ALT: 34 IU/L — AB (ref 0–32)
AST: 27 IU/L (ref 0–40)
Albumin: 4.6 g/dL (ref 3.5–5.5)
Alkaline Phosphatase: 42 IU/L (ref 39–117)
BILIRUBIN TOTAL: 0.6 mg/dL (ref 0.0–1.2)
BUN / CREAT RATIO: 17 (ref 9–23)
BUN: 13 mg/dL (ref 6–24)
CHLORIDE: 93 mmol/L — AB (ref 96–106)
CO2: 24 mmol/L (ref 18–29)
Calcium: 9.9 mg/dL (ref 8.7–10.2)
Creatinine, Ser: 0.75 mg/dL (ref 0.57–1.00)
GFR calc non Af Amer: 88 mL/min/{1.73_m2} (ref >59)
GFR, EST AFRICAN AMERICAN: 101 mL/min/{1.73_m2} (ref >59)
Globulin, Total: 2.8 g/dL (ref 1.5–4.5)
Glucose: 106 mg/dL — ABNORMAL HIGH (ref 65–99)
POTASSIUM: 3.3 mmol/L — AB (ref 3.5–5.2)
Sodium: 140 mmol/L (ref 134–144)
TOTAL PROTEIN: 7.4 g/dL (ref 6.0–8.5)

## 2016-07-20 LAB — CBC WITH DIFFERENTIAL/PLATELET
BASOS ABS: 0 10*3/uL (ref 0.0–0.2)
Basos: 0 %
EOS (ABSOLUTE): 0.2 10*3/uL (ref 0.0–0.4)
Eos: 1 %
Hematocrit: 43.8 % (ref 34.0–46.6)
Hemoglobin: 15.1 g/dL (ref 11.1–15.9)
Immature Grans (Abs): 0 10*3/uL (ref 0.0–0.1)
Immature Granulocytes: 0 %
LYMPHS: 39 %
Lymphocytes Absolute: 4.1 10*3/uL — ABNORMAL HIGH (ref 0.7–3.1)
MCH: 31.3 pg (ref 26.6–33.0)
MCHC: 34.5 g/dL (ref 31.5–35.7)
MCV: 91 fL (ref 79–97)
Monocytes Absolute: 1 10*3/uL — ABNORMAL HIGH (ref 0.1–0.9)
Monocytes: 9 %
NEUTROS ABS: 5.2 10*3/uL (ref 1.4–7.0)
Neutrophils: 51 %
Platelets: 283 10*3/uL (ref 150–379)
RBC: 4.83 x10E6/uL (ref 3.77–5.28)
RDW: 13.2 % (ref 12.3–15.4)
WBC: 10.4 10*3/uL (ref 3.4–10.8)

## 2016-07-20 LAB — TSH: TSH: 1.76 u[IU]/mL (ref 0.450–4.500)

## 2016-07-20 LAB — VITAMIN D 25 HYDROXY (VIT D DEFICIENCY, FRACTURES): VIT D 25 HYDROXY: 19.6 ng/mL — AB (ref 30.0–100.0)

## 2016-07-20 LAB — PARATHYROID HORMONE, INTACT (NO CA): PTH: 41 pg/mL (ref 15–65)

## 2016-07-20 LAB — T4, FREE: Free T4: 1.26 ng/dL (ref 0.82–1.77)

## 2016-07-20 MED ORDER — VITAMIN D3 50 MCG (2000 UT) PO CAPS: 2000.0000 [IU] | ORAL_CAPSULE | Freq: Every day | ORAL | Status: DC

## 2016-07-20 MED ORDER — OMEGA 3 1000 MG PO CAPS: 1.0000 | ORAL_CAPSULE | Freq: Every day | ORAL | Status: DC

## 2016-07-20 MED ORDER — ATORVASTATIN CALCIUM 20 MG PO TABS: 20.0000 mg | ORAL_TABLET | Freq: Every day | ORAL | 1 refills | Status: DC

## 2016-07-20 NOTE — Progress Notes (Signed)
Atorvastatin 20mg  daily Added omega 3 and vitamin D3 otc

## 2016-08-03 ENCOUNTER — Encounter: Payer: Self-pay | Admitting: Family Medicine

## 2016-08-03 ENCOUNTER — Ambulatory Visit (INDEPENDENT_AMBULATORY_CARE_PROVIDER_SITE_OTHER): Payer: BLUE CROSS/BLUE SHIELD | Admitting: Family Medicine

## 2016-08-03 VITALS — BP 110/68 | HR 68 | Temp 98.1°F | Ht 67.25 in | Wt 149.6 lb

## 2016-08-03 DIAGNOSIS — E876 Hypokalemia: Secondary | ICD-10-CM | POA: Diagnosis not present

## 2016-08-03 DIAGNOSIS — F418 Other specified anxiety disorders: Secondary | ICD-10-CM | POA: Diagnosis not present

## 2016-08-03 MED ORDER — LORAZEPAM 1 MG PO TABS: 1.0000 mg | ORAL_TABLET | Freq: Four times a day (QID) | ORAL | 2 refills | Status: DC | PRN

## 2016-08-03 MED ORDER — POTASSIUM CHLORIDE ER 10 MEQ PO TBCR: 20.0000 meq | EXTENDED_RELEASE_TABLET | Freq: Two times a day (BID) | ORAL | 2 refills | Status: DC

## 2016-08-03 MED ORDER — VENLAFAXINE HCL ER 37.5 MG PO CP24: 37.5000 mg | ORAL_CAPSULE | Freq: Every day | ORAL | 2 refills | Status: DC

## 2016-08-03 NOTE — Patient Instructions (Signed)
Please consider counseling/cognitive behavioral threapy. The medical literature and evidence-based guidelines support it. Contact [phone] to schedule an appointment or inquire about cost/insurance coverage.  Let our office know if the medicine is not working well for you from a side effect stand point.

## 2016-08-03 NOTE — Progress Notes (Signed)
Chief Complaint  Patient presents with  . Establish Care    pt want to discuss medication management       New Patient Visit SUBJECTIVE: HPI: Beth Frey is an 61 y.o.female who is being seen for establishing care.  The patient was previously seen at Dr. Kaleen Mask office. She left due to a difference of opinion.  She is here with her husband who is an emergency department physician.  The patient has a history of anxiety currently on Ativan 1 mg every 8 hours. She has tried and failed Zoloft and Prozac in the past. She does not follow w a counselor, psychologist, and does not follow a psychiatrist. She has never had cognitive behavioral therapy. She does have contributing life stressors including a mother with dementia and difficult finding a placement for her. She is not self-medicating. No homicidal or suicidal ideation.  The patient has a history of chronic hypokalemia. She states she's been to a nephrologist and had extensive laboratory workup done that was unremarkable. One of her specialists felt that this is related to poor absorption secondary to bowel resection. She currently takes 20 mEq of Klor-Con twice daily. She is requesting a smaller pill if possible.  Allergies  Allergen Reactions  . Sulfa Antibiotics Itching and Swelling    Throat, body swelling  . Doxycycline     Sun     Past Medical History:  Diagnosis Date  . Anxiety   . Back pain, chronic   . Cancer ([place])    skin  . 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 Osteoarthritis    Past Surgical History:  Procedure Laterality Date  . ABLATION SAPHENOUS VEIN W/ RFA    . BREAST SURGERY  August 2011   Implants removed  . CHOLECYSTECTOMY    . COLON SURGERY  02/20/2010   Right hemicolectomy-cecal bascule  . ELBOW SURGERY     left elbow  . JOINT REPLACEMENT  2006   Left total knee replacement  . OVARIAN CYST REMOVAL     right  . SKIN CANCER EXCISION     multiple-SCC and BCC  . TUBAL LIGATION    . Uterine ablation    .  WRIST SURGERY  2009   right wrist   Social History   Social History  . Marital status: Married   Social History Main Topics  . Smoking status: Never Smoker  . Smokeless tobacco: Never Used  . Alcohol use No  . Drug use: No     Comment: married   Family History  Problem Relation Age of Onset  . Cancer Father     stomach cancer  . Cancer Brother     pancreatic cancer  . Stroke Maternal Grandmother   . Cancer Maternal Grandfather     lung cancer  . Cancer Paternal Grandmother     ovarian  . Heart disease Paternal Grandfather      Current Outpatient Prescriptions:  .  Cholecalciferol (VITAMIN D3) 5000 units CAPS, Take 1 capsule by mouth daily., Disp: , Rfl:  .  cloNIDine (CATAPRES) 0.2 MG tablet, Take 1 tablet (0.2 mg total) by mouth 3 (three) times daily. patient may take an extra pill if blood pressure spikes., Disp: 90 tablet, Rfl: 1 .  LORazepam (ATIVAN) 1 MG tablet, Take 1 tablet (1 mg total) by mouth every 6 (six) hours as needed., Disp: 120 tablet, Rfl: 2 .  mometasone (NASONEX) 50 MCG/ACT nasal spray, Place 2 sprays into the nose daily as needed (allergies). , Disp: ,  Rfl:  .  mupirocin ointment (BACTROBAN) 2 %, apply TO WOUND SITES TWICE A DAY, Disp: , Rfl: 0 .  Omega 3 1000 MG CAPS, Take 1 capsule (1,000 mg total) by mouth daily., Disp: 90 each, Rfl:  .  ondansetron (ZOFRAN-ODT) 8 MG disintegrating tablet, dissolve 1 tablet ON TONGUE every 8 hours if needed, Disp: 30 tablet, Rfl: 0 .  polyethylene glycol (MIRALAX / GLYCOLAX) packet, Take 17 g by mouth daily as needed for mild constipation or moderate constipation. , Disp: , Rfl:  .  potassium chloride (KLOR-CON 10) 10 MEQ tablet, Take 2 tablets (20 mEq total) by mouth 2 (two) times daily., Disp: 60 tablet, Rfl: 2 .  venlafaxine XR (EFFEXOR-XR) 37.5 MG 24 hr capsule, Take 1 capsule (37.5 mg total) by mouth daily with breakfast., Disp: 30 capsule, Rfl: 2  No LMP recorded. Patient is  postmenopausal.  ROS Cardiovascular: Denies palpitations  Psych: As noted in HPI   OBJECTIVE: BP 110/68 (BP Location: Left Arm, Patient Position: Sitting, Cuff Size: Normal)   Pulse 68   Temp 98.1 F (36.7 C) (Oral)   Ht 5' 7.25" (1.708 m)   Wt 149 lb 9.6 oz (67.9 kg)   SpO2 99%   BMI 23.26 kg/m   Constitutional: -  VS reviewed -  Well developed, well nourished, appears stated age -  No apparent distress  Psychiatric: -  Oriented to person, place, and time -  Memory intact -  Affect and mood normal -  Fluent conversation, good eye contact -  Judgment and insight age appropriate  Eye: -  Conjunctivae clear, no discharge -  Pupils symmetric, round, reactive to light  ENMT: -  Oral mucosa without lesions, tongue and uvula midline    Tonsils not enlarged, no erythema, no exudate, trachea midline    Pharynx moist, no lesions, no erythema  Neck: -  No gross swelling, no palpable masses -  Thyroid midline, not enlarged, mobile, no palpable masses  Cardiovascular: -  RRR, no murmurs -  No LE edema  Respiratory: -  Normal respiratory effort, no accessory muscle use, no retraction -  Breath sounds equal, no wheezes, no ronchi, no crackles  Gastrointestinal: -  Bowel sounds normal -  No tenderness, no distention, no guarding, no masses  Neurological:  -  CN II - XII grossly intact -  Sensation grossly intact to light touch, equal bilaterally  Skin: -  No significant lesion on inspection -  Warm and dry to palpation   ASSESSMENT/PLAN: Situational anxiety - Plan: venlafaxine XR (EFFEXOR-XR) 37.5 MG 24 hr capsule, LORazepam (ATIVAN) 1 MG tablet  Hypokalemia - Plan: potassium chloride (KLOR-CON 10) 10 MEQ tablet  Orders as above, start Effexor. Discussed that her current regimen of Ativan is not my first choice. I would like her to be on something daily if possible. Requirements for this. Another option would be the call for counseling/cognitive behavioral therapy. She was unaware  of the latter option. The number was given her after visit summary. If she does not improve with SSRI, will trial duloxetine vs propranolol that can try propranolol versus BuSpar versus Abilify. Will change 20 mEq dose to 10 mEq. Patient should return in 6 weeks. She will let us know she says having issues with medicine. Will have her fill out a controlled substance contract and perhaps a UDS. The patient voiced understanding and agreement to the plan.   [place], DO 08/03/16  11:40 AM

## 2016-08-03 NOTE — Progress Notes (Signed)
Pre visit review using our clinic review tool, if applicable. No additional management support is needed unless otherwise documented below in the visit note. 

## 2016-08-10 ENCOUNTER — Encounter: Payer: Self-pay | Admitting: Endocrinology

## 2016-08-11 DIAGNOSIS — H5213 Myopia, bilateral: Secondary | ICD-10-CM | POA: Diagnosis not present

## 2016-08-11 DIAGNOSIS — H04123 Dry eye syndrome of bilateral lacrimal glands: Secondary | ICD-10-CM | POA: Diagnosis not present

## 2016-09-07 DIAGNOSIS — M1812 Unilateral primary osteoarthritis of first carpometacarpal joint, left hand: Secondary | ICD-10-CM | POA: Diagnosis not present

## 2016-09-07 DIAGNOSIS — M1811 Unilateral primary osteoarthritis of first carpometacarpal joint, right hand: Secondary | ICD-10-CM | POA: Diagnosis not present

## 2016-09-08 DIAGNOSIS — M1711 Unilateral primary osteoarthritis, right knee: Secondary | ICD-10-CM | POA: Diagnosis not present

## 2016-09-08 DIAGNOSIS — Z96652 Presence of left artificial knee joint: Secondary | ICD-10-CM | POA: Diagnosis not present

## 2016-09-11 ENCOUNTER — Ambulatory Visit: Payer: BLUE CROSS/BLUE SHIELD | Admitting: Podiatry

## 2016-09-11 DIAGNOSIS — M722 Plantar fascial fibromatosis: Secondary | ICD-10-CM | POA: Diagnosis not present

## 2016-09-14 ENCOUNTER — Encounter: Payer: Self-pay | Admitting: Family Medicine

## 2016-09-14 ENCOUNTER — Ambulatory Visit (INDEPENDENT_AMBULATORY_CARE_PROVIDER_SITE_OTHER): Payer: BLUE CROSS/BLUE SHIELD | Admitting: Family Medicine

## 2016-09-14 VITALS — BP 126/80 | HR 87 | Temp 98.0°F | Ht 67.25 in | Wt 147.6 lb

## 2016-09-14 DIAGNOSIS — F418 Other specified anxiety disorders: Secondary | ICD-10-CM

## 2016-09-14 NOTE — Progress Notes (Signed)
Chief Complaint  Patient presents with  . Follow-up    6 weeks-recheck on anxiety-pt states the medication did not work-SE:swelling and nausea    Subjective Beth Frey is an 61 y.o. female who presents with anxiety/depression Anxiety symptoms: anxiousness, racing thoughts, insomnia Social stressors include her mother has dementia and she is struggling through an unsupportive assisted living facility. She is currently being treated with Ativan and has failed Effexor, Zoloft and Prozac. She is not following with a psychologist.  Past Medical History:  Diagnosis Date  . Anxiety   . Back pain, chronic   . Cancer ([place])    skin  . 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 Osteoarthritis     Medications Current Outpatient Prescriptions on File Prior to Visit  Medication Sig Dispense Refill  . Cholecalciferol (VITAMIN D3) 5000 units CAPS Take 1 capsule by mouth daily.    . cloNIDine (CATAPRES) 0.2 MG tablet Take 1 tablet (0.2 mg total) by mouth 3 (three) times daily. patient may take an extra pill if blood pressure spikes. 90 tablet 1  . LORazepam (ATIVAN) 1 MG tablet Take 1 tablet (1 mg total) by mouth every 6 (six) hours as needed. 120 tablet 2  . mometasone (NASONEX) 50 MCG/ACT nasal spray Place 2 sprays into the nose daily as needed (allergies).     . mupirocin ointment (BACTROBAN) 2 % apply TO WOUND SITES TWICE A DAY  0  . Omega 3 1000 MG CAPS Take 1 capsule (1,000 mg total) by mouth daily. 90 each   . ondansetron (ZOFRAN-ODT) 8 MG disintegrating tablet dissolve 1 tablet ON TONGUE every 8 hours if needed 30 tablet 0  . polyethylene glycol (MIRALAX / GLYCOLAX) packet Take 17 g by mouth daily as needed for mild constipation or moderate constipation.     . potassium chloride (KLOR-CON 10) 10 MEQ tablet Take 2 tablets (20 mEq total) by mouth 2 (two) times daily. 60 tablet 2  . venlafaxine XR (EFFEXOR-XR) 37.5 MG 24 hr capsule Take 1 capsule (37.5 mg total) by mouth daily with breakfast. (Patient not  taking: Reported on 09/14/2016) 30 capsule 2   Allergies Allergies  Allergen Reactions  . Sulfa Antibiotics Itching and Swelling    Throat, body swelling  . Doxycycline     Sun    Family History Family History  Problem Relation Age of Onset  . Cancer Father        stomach cancer  . Cancer Brother        pancreatic cancer  . Stroke Maternal Grandmother   . Cancer Maternal Grandfather        lung cancer  . Cancer Paternal Grandmother        ovarian  . Heart disease Paternal Grandfather      Review Of Systems Constitutional:  no unexplained fevers, sweats, or chills Cardiovascular:  no chest pain, no palpitations Gastrointestinal:  no nausea, vomiting, diarrhea, or constipation Psychiatric: as noted in HPI  Exam BP 126/80 (BP Location: Left Arm, Patient Position: Sitting, Cuff Size: Normal)   Pulse 87   Temp 98 F (36.7 C) (Oral)   Ht 5' 7.25" (1.708 m)   Wt 147 lb 9.6 oz (67 kg)   SpO2 99%   BMI 22.95 kg/m  General:  well developed, well nourished, in no apparent distress Neck: neck supple without adenopathy, thyromegaly, or masses Lungs:  clear to auscultation, breath sounds equal bilaterally, normal respiratory effort without accessory muscle use Cardio:  regular rate and rhythm without murmurs  Abdomen:  abdomen soft, nontender; bowel sounds normal; no masses or organomegaly Neuro:  deep tendon reflexes normal and symmetric and no cerebellar signs or ataxia noted Psych: well oriented with normal range of affect and age-appropriate judgement/insight  Assessment and Plan  Situational anxiety - Plan: Ambulatory referral to Psychology  I reiterated that I will keep her current Ativan dosing and frequency as is for now, but would like her to consider CBT. Will set up with Terri here to help with sleep and possibly anxiety symptoms as well. Number also given. I ideally do not want her on this much. Will consider Trintellix vs Trazodone in future if considering daily  medicine again. I do not believe that she would be taking this much Ativan were it not for the situation regarding her mother. F/u in 6 mo for med check or prn.  [place], DO 09/14/16 11:53 AM

## 2016-09-14 NOTE — Patient Instructions (Addendum)
Please consider "cognitive behavioral therapy". The medical literature and evidence-based guidelines support it. Contact [phone] to schedule an appointment or inquire about cost/insurance coverage.  If you do not hear anything about your referral in the next 1-2 weeks, call our office and ask for an update.

## 2016-09-16 ENCOUNTER — Other Ambulatory Visit: Payer: Self-pay | Admitting: Family Medicine

## 2016-09-16 DIAGNOSIS — E876 Hypokalemia: Secondary | ICD-10-CM

## 2016-09-18 NOTE — Telephone Encounter (Signed)
Rx sent to the pharmacy by e-script.//AB/CMA 

## 2016-09-20 DIAGNOSIS — M25562 Pain in left knee: Secondary | ICD-10-CM | POA: Diagnosis not present

## 2016-09-20 DIAGNOSIS — M25561 Pain in right knee: Secondary | ICD-10-CM | POA: Diagnosis not present

## 2016-09-27 DIAGNOSIS — Z471 Aftercare following joint replacement surgery: Secondary | ICD-10-CM | POA: Diagnosis not present

## 2016-09-27 DIAGNOSIS — M25561 Pain in right knee: Secondary | ICD-10-CM | POA: Diagnosis not present

## 2016-09-27 DIAGNOSIS — Z96652 Presence of left artificial knee joint: Secondary | ICD-10-CM | POA: Diagnosis not present

## 2016-09-27 DIAGNOSIS — M25562 Pain in left knee: Secondary | ICD-10-CM | POA: Diagnosis not present

## 2016-10-06 ENCOUNTER — Ambulatory Visit (INDEPENDENT_AMBULATORY_CARE_PROVIDER_SITE_OTHER): Payer: BLUE CROSS/BLUE SHIELD | Admitting: Psychology

## 2016-10-06 DIAGNOSIS — F4323 Adjustment disorder with mixed anxiety and depressed mood: Secondary | ICD-10-CM | POA: Diagnosis not present

## 2016-10-13 ENCOUNTER — Ambulatory Visit: Payer: BLUE CROSS/BLUE SHIELD | Admitting: Psychology

## 2016-10-16 DIAGNOSIS — R229 Localized swelling, mass and lump, unspecified: Secondary | ICD-10-CM | POA: Diagnosis not present

## 2016-10-17 ENCOUNTER — Telehealth: Payer: Self-pay | Admitting: Family Medicine

## 2016-10-17 DIAGNOSIS — G479 Sleep disorder, unspecified: Secondary | ICD-10-CM

## 2016-10-17 NOTE — Telephone Encounter (Signed)
Relation to [id number]  Call back [phone]   Reason for call:  Patient requesting a referral to Dr. Asencion Partridge Dohmeier MD neurologist due to irregular sleep patterns for the past 4 months. Patient started crying whiling explaining why she was in need of referral, stating irregular sleep patterns is taking a toll on her life, patient did state she contacted Dr. Brett Fairy office and there next availability is not until a few weeks and she's ok with seeing another physcian within there office, please advise

## 2016-10-18 NOTE — Telephone Encounter (Signed)
OK 

## 2016-10-18 NOTE — Telephone Encounter (Signed)
Referral placed and sent.//AB/CMA

## 2016-10-26 ENCOUNTER — Ambulatory Visit (INDEPENDENT_AMBULATORY_CARE_PROVIDER_SITE_OTHER): Payer: BLUE CROSS/BLUE SHIELD | Admitting: Psychology

## 2016-10-26 DIAGNOSIS — F4323 Adjustment disorder with mixed anxiety and depressed mood: Secondary | ICD-10-CM

## 2016-10-31 ENCOUNTER — Ambulatory Visit (INDEPENDENT_AMBULATORY_CARE_PROVIDER_SITE_OTHER): Payer: BLUE CROSS/BLUE SHIELD | Admitting: Neurology

## 2016-10-31 ENCOUNTER — Encounter: Payer: Self-pay | Admitting: Neurology

## 2016-10-31 ENCOUNTER — Other Ambulatory Visit: Payer: Self-pay | Admitting: Family Medicine

## 2016-10-31 VITALS — BP 142/91 | HR 81 | Ht 65.5 in | Wt 146.0 lb

## 2016-10-31 DIAGNOSIS — F5102 Adjustment insomnia: Secondary | ICD-10-CM | POA: Insufficient documentation

## 2016-10-31 DIAGNOSIS — R0683 Snoring: Secondary | ICD-10-CM | POA: Diagnosis not present

## 2016-10-31 DIAGNOSIS — F411 Generalized anxiety disorder: Secondary | ICD-10-CM

## 2016-10-31 DIAGNOSIS — F418 Other specified anxiety disorders: Secondary | ICD-10-CM

## 2016-10-31 DIAGNOSIS — I10 Essential (primary) hypertension: Secondary | ICD-10-CM

## 2016-10-31 MED ORDER — SERTRALINE HCL 25 MG PO TABS: 25.0000 mg | ORAL_TABLET | Freq: Every day | ORAL | 1 refills | Status: DC

## 2016-10-31 NOTE — Patient Instructions (Signed)

## 2016-10-31 NOTE — Progress Notes (Signed)
SLEEP MEDICINE CLINIC   Provider:  Larey Seat, M D  Primary Care Physician:  Shelda Pal, DO   Referring Provider: Shelda Pal*   Chief Complaint  Patient presents with  . New Patient (Initial Visit)    snoring, lack of sleep, never had a sleep study    HPI:  Beth Frey is a 61 y.o. female , seen here as in a referral/ revisit  from Dr. Riki Sheer for evaluation of insomnia.   Chief complaint according to patient : " since December 17 I have been unable to sleep " , I broke down in June.  My mother has parkinson's disease, she became deliriant and demented and I became her guardian. My father died of cancer  before age 79, my 2 brother's died of cancer before 24, and I am the only one left. I had to sell her home, she has trusts to look after, and she moved to [place].   She struggled with insomnia for years- In 2011 her OB- GYN d/c oestrogen and she went into "a crisis"- Dr. Everlene Farrier started her on Ativan q 6 hours and benadryl at night. She only sleeps 3 hours with this mix. She is very anxious. She is now working with Dr. Nani Ravens on changing this regimen to q 8 hours.   Sleep habits are as follows:" I do not nap in day time". 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 I go to bed by 10. 30 and stay awake - when Orene Desanctis goes home by 7.30 AM I can go to sleep" .  She only sleeps until 9 AM.    Sleep medical history and family sleep history: PD I her mother, one brother died of bone cancer, another of pancreatic cancer. Father died at age 42 of gastric adenocarcinoma.   Social history:  Retired from ICU and cancer pain Immunologist, used to work at [employer], [employer]- [place]. Married to ER physician. Childless. She retired when her brother got sick and she cared.   Review of Systems: Out of a complete 14 system review, the patient complains of only the following symptoms, and all other reviewed systems are negative. Snoring -  wakes her up, anxiety, panic.   Epworth score 2 , Fatigue severity score 36  , depression score 2/15   Social History   Social History  . Marital status: Married    Spouse name: N/A  . Number of children: N/A  . Years of education: N/A   Occupational History  . Not on file.   Social History Main Topics  . Smoking status: Never Smoker  . Smokeless tobacco: Never Used  . Alcohol use No  . Drug use: No  . Sexual activity: Not on file     Comment: married   Other Topics Concern  . Not on file   Social History Narrative  . No narrative on file    Family History  Problem Relation Age of Onset  . Cancer Father        stomach cancer  . Cancer Brother        pancreatic cancer  . Stroke Maternal Grandmother   . Cancer Maternal Grandfather        lung cancer  . Cancer Paternal Grandmother        ovarian  . Heart disease Paternal Grandfather     Past Medical History:  Diagnosis Date  . Anxiety   . Back pain, chronic   .  Cancer ([place])    skin  . 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 Osteoarthritis     Past Surgical History:  Procedure Laterality Date  . ABLATION SAPHENOUS VEIN W/ RFA    . BREAST SURGERY  August 2011   Implants removed  . CHOLECYSTECTOMY    . COLON SURGERY  02/20/2010   Right hemicolectomy-cecal bascule  . ELBOW SURGERY     left elbow  . JOINT REPLACEMENT  2006   Left total knee replacement  . OVARIAN CYST REMOVAL     right  . SKIN CANCER EXCISION     multiple-SCC and BCC  . TUBAL LIGATION    . Uterine ablation    . WRIST SURGERY  2009   right wrist    Current Outpatient Prescriptions  Medication Sig Dispense Refill  . Cholecalciferol (VITAMIN D3) 5000 units CAPS Take 1 capsule by mouth daily.    . cloNIDine (CATAPRES) 0.2 MG tablet Take 1 tablet (0.2 mg total) by mouth 3 (three) times daily. patient may take an extra pill if blood pressure spikes. 90 tablet 1  . LORazepam (ATIVAN) 1 MG tablet Take 1 tablet (1 mg total) by mouth every 6 (six) hours  as needed. 120 tablet 2  . mometasone (NASONEX) 50 MCG/ACT nasal spray Place 2 sprays into the nose daily as needed (allergies).     . mupirocin ointment (BACTROBAN) 2 % apply TO WOUND SITES TWICE A DAY  0  . Omega 3 1000 MG CAPS Take 1 capsule (1,000 mg total) by mouth daily. 90 each   . ondansetron (ZOFRAN-ODT) 8 MG disintegrating tablet dissolve 1 tablet ON TONGUE every 8 hours if needed 30 tablet 0  . polyethylene glycol (MIRALAX / GLYCOLAX) packet Take 17 g by mouth daily as needed for mild constipation or moderate constipation.     . potassium chloride (K-DUR) 10 MEQ tablet take 2 tablets by mouth twice a day 60 tablet 2   No current facility-administered medications for this visit.     Allergies as of 10/31/2016 - Review Complete 10/31/2016  Allergen Reaction Noted  . Sulfa antibiotics Itching and Swelling 11/26/2010  . Doxycycline  11/03/2013    Vitals: BP (!) 142/91   Pulse 81   Ht 5' 5.5" (1.664 m)   Wt 146 lb (66.2 kg)   BMI 23.93 kg/m  Last Weight:  Wt Readings from Last 1 Encounters:  10/31/16 146 lb (66.2 kg)   IZT:IWPY mass index is 23.93 kg/m.     Last Height:   Ht Readings from Last 1 Encounters:  10/31/16 5' 5.5" (1.664 m)    Physical exam:  General: The patient is awake, alert and appears not in acute distress. The patient is well groomed. Head: Normocephalic, atraumatic. Neck is supple. Mallampati 2,  neck circumference:14. Nasal airflow patent  Cardiovascular:  Regular rate and rhythm  without  murmurs or carotid bruit, and without distended neck veins. Respiratory: Lungs are clear to auscultation. Skin:  Without evidence of edema, or rash Trunk: BMI is 24. The patient's posture is erect   Neurologic exam : The patient is awake and alert, oriented to place and time.   Memory subjective described as intact.   Attention span & concentration ability appears normal.  Speech is fluent,  without dysarthria, dysphonia or aphasia.  Mood and affect are  appropriate.  Cranial nerves: Pupils are equal and briskly reactive to light.  Funduscopic exam without evidence of pallor or edema. Extraocular movements  in vertical and horizontal  planes intact and without nystagmus. Visual fields by finger perimetry are intact.Hearing to finger rub intact.  Facial sensation intact to fine touch. Facial motor strength is symmetric and tongue and uvula move midline. Shoulder shrug was symmetrical.   Motor exam:  Normal tone, muscle bulk and symmetric strength in all extremities. Sensory:  Fine touch, pinprick and vibration were tested in all extremities. Proprioception tested in the upper extremities was normal. Coordination: Rapid alternating movements in the fingers/hands was normal. Finger-to-nose maneuver  normal without evidence of ataxia, dysmetria or tremor. Gait and station: Patient walks without assistive device and is able unassisted to climb up to the exam table. Strength within normal limits.  Stance is stable and normal.  Tandem gait is unfragmented. Turns with  3 Steps. Deep tendon reflexes: in the  upper and lower extremities are symmetric and intact. Babinski maneuver response is downgoing.    Assessment:  After physical and neurologic examination, review of laboratory studies,  Personal review of imaging studies, reports of other /same  Imaging studies, results of polysomnography and / or neurophysiology testing and pre-existing records as far as provided in visit., my assessment is   1)  Mrs. Bossler has just noted that she begun snoring about 2-3 weeks ago and this coincides with her taking Benadryl at night. I think the snoring may be simply a medication side effect. Anatomically and by body mass index she is not prone to develop obstructive sleep apnea. She does take muscle relaxant medication and tranquilizers in the treatment of her long-standing anxiety condition but is working with cognitive therapy and with her primary care physician to  reduce the intake frequency. She developed nightmares on melatonin.   The patient was advised of the nature of the diagnosed disorder , the treatment options and the  risks for general health and wellness arising from not treating the condition.   I spent more than 45  minutes of face to face time with the patient.  Greater than 50% of time was spent in counseling and coordination of care. We have discussed the diagnosis and differential and I answered the patient's questions.    Plan:  Treatment plan and additional workup :  Insomnia - with strong psychological causes.  She may benefit from an antidepressant. I will not continue benzodiazepines.  I will order a HST to check out snoring / OSA.    Larey Seat, MD   8/67/6195, 0:93 AM  Certified in Neurology by ABPN Certified in [place] by [facility] [address] [place], [place] [address]

## 2016-11-02 DIAGNOSIS — M1712 Unilateral primary osteoarthritis, left knee: Secondary | ICD-10-CM | POA: Diagnosis not present

## 2016-11-03 ENCOUNTER — Other Ambulatory Visit: Payer: Self-pay | Admitting: Family Medicine

## 2016-11-03 DIAGNOSIS — E876 Hypokalemia: Secondary | ICD-10-CM

## 2016-11-03 NOTE — Telephone Encounter (Signed)
Rx sent to the pharmacy by e-script.//AB/CMA 

## 2016-11-06 NOTE — Telephone Encounter (Signed)
Faxed hardcopy to [facility]

## 2016-11-06 NOTE — Telephone Encounter (Signed)
Changed to every 8 hours as we are trying to wean down. TY.

## 2016-11-06 NOTE — Telephone Encounter (Signed)
Had to phone in prescription/faxed failed twice.

## 2016-11-06 NOTE — Telephone Encounter (Signed)
Requesting:   lorazepam Contract    none UDS   none Last OV    09/14/2016 Last Refill     #120 with 2 refills on 08/03/2016  Please Advise

## 2016-11-10 DIAGNOSIS — M1712 Unilateral primary osteoarthritis, left knee: Secondary | ICD-10-CM | POA: Diagnosis not present

## 2016-11-13 ENCOUNTER — Ambulatory Visit (INDEPENDENT_AMBULATORY_CARE_PROVIDER_SITE_OTHER): Payer: BLUE CROSS/BLUE SHIELD | Admitting: Psychology

## 2016-11-13 DIAGNOSIS — F4323 Adjustment disorder with mixed anxiety and depressed mood: Secondary | ICD-10-CM | POA: Diagnosis not present

## 2016-11-16 DIAGNOSIS — M1712 Unilateral primary osteoarthritis, left knee: Secondary | ICD-10-CM | POA: Diagnosis not present

## 2016-11-29 ENCOUNTER — Ambulatory Visit (INDEPENDENT_AMBULATORY_CARE_PROVIDER_SITE_OTHER): Payer: BLUE CROSS/BLUE SHIELD | Admitting: Neurology

## 2016-11-29 DIAGNOSIS — G4730 Sleep apnea, unspecified: Principal | ICD-10-CM

## 2016-11-29 DIAGNOSIS — G4733 Obstructive sleep apnea (adult) (pediatric): Secondary | ICD-10-CM

## 2016-11-29 DIAGNOSIS — R0683 Snoring: Secondary | ICD-10-CM

## 2016-11-29 DIAGNOSIS — G4700 Insomnia, unspecified: Secondary | ICD-10-CM

## 2016-11-30 NOTE — Addendum Note (Signed)
Addended by: Larey Seat on: 11/30/2016 05:16 PM   Modules accepted: Orders

## 2016-11-30 NOTE — Procedures (Signed)
[facility]  Neurologic Associate [address] [place], [place] [address] NAME: Beth Frey    DOB: 1955/07/30 MEDICAL RECORD No: [id number]   DOS: 11/29/16 REFERRING PHYSICIAN: Riki Sheer, DO STUDY PERFORMED: HST HISTORY: Delina Kruczek was referred for evaluation of insomnia.  She struggled with insomnia for years- recalls w how the abrupt discontinuation of her HRT started a crisis in 2011 , when she could not sleep at all. Dr. Everlene Farrier started her on Ativan q 6 hours and Benadryl at night. She only sleeps 3 hours with this mix. She is very anxious. She is now working with Dr. Nani Ravens on changing this regimen to q 8 hours.  Epworth Sleepiness score endorsed at 2 points, Fatigue severity score at 36, depression score at 2/15. BMI 24.   STUDY RESULTS:  Total Recording Time: 9h 50m Total Apnea/Hypopnea Index (AHI): 8.1/hr Average Oxygen Saturation: SpO2 94% Lowest Oxygen Saturation: SpO2 84% only 1 minute duration.  Average Heart Rate: 72 bpm, between 105 and 65 bpm.  IMPRESSION: mild sleep apnea, with mild upper respiratory resistance (RDI was 10.8/hr.) In the context of insomnia, this may be one of several contributing factors.  I would much prefer an attended CPAP titration -sleep study to see if other physiologic reasons for insomnia and arousals are present.    RECOMMENDATION:  I certify that I have reviewed the raw data recording prior to the issuance of this report in accordance with the standards of the American Academy of Sleep Medicine (AASM). Larey Seat, MD  11-30-2016  [facility], Sleep Medical Director Diplomat, ABPN and ABSM Member of the AASM

## 2016-12-01 ENCOUNTER — Telehealth: Payer: Self-pay | Admitting: Family Medicine

## 2016-12-01 NOTE — Telephone Encounter (Signed)
Pt called in to request to have letter that was discussed with assistant mailed to her at home. She said that she is unable to come in to pick up as she told assistant that she would.    Per pt letter is at the front for pick up however I don't see it. Asked pt if assistant would know what letter she is speaking about, she said that she would.

## 2016-12-04 NOTE — Telephone Encounter (Signed)
Letter mailed.//AB/CMA

## 2016-12-05 ENCOUNTER — Telehealth: Payer: Self-pay | Admitting: Neurology

## 2016-12-05 NOTE — Telephone Encounter (Signed)
Called to discuss sleep study results. LVM for pt to return call

## 2016-12-05 NOTE — Telephone Encounter (Signed)
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  There was mild sleep apnea, mild UAR Syndrome is suspected. RDI was 10.8. Patient needs attended sleep study to better evaluate her arousals and insomnia. I will order an attended CPAP titration with one hour baseline. If denied , will discuss with patient if she wants to pursue further work up, dental device , etc. CD

## 2016-12-05 NOTE — Telephone Encounter (Signed)
Patient returning your call regarding sleep results.

## 2016-12-05 NOTE — Telephone Encounter (Signed)
Called and discussed the sleep study results with the pt. I made her aware of all the findings in the study. I explained that Dr Brett Fairy was looking for her to receive a titration study in order to see if there were any other physiologic reasons for the insomnia. In speaking with the pt she stated that she had been under a lot of stress and Dr Brett Fairy had ordered some Zoloft for her and since that she has been much better and is sleeping much better. At this time she doesn't want to move forward with the titration study. She wanted to thank Dr Brett Fairy because she has made her feel like a new person and she is also seeking therapy which is helpful. Pt verbalized understanding of the results and had no further questions

## 2016-12-05 NOTE — Telephone Encounter (Signed)
Pt apologizes for missing your call.  Pt is asking for a call back please from [facility]

## 2016-12-06 ENCOUNTER — Telehealth: Payer: Self-pay | Admitting: Neurology

## 2016-12-06 NOTE — Telephone Encounter (Signed)
Pt at this time would not like to proceed forward with CPAP.

## 2016-12-06 NOTE — Telephone Encounter (Signed)
[employer] denied CPAP Titration suggest Auto pap °

## 2016-12-21 ENCOUNTER — Other Ambulatory Visit: Payer: Self-pay

## 2016-12-21 DIAGNOSIS — F411 Generalized anxiety disorder: Secondary | ICD-10-CM

## 2016-12-21 DIAGNOSIS — I10 Essential (primary) hypertension: Secondary | ICD-10-CM

## 2016-12-21 MED ORDER — CLONIDINE HCL 0.2 MG PO TABS: ORAL_TABLET | ORAL | 1 refills | Status: DC

## 2016-12-21 NOTE — Telephone Encounter (Signed)
[facility]ed [facility]d+1 [facility]/thx dmf,rma

## 2017-01-03 ENCOUNTER — Ambulatory Visit (HOSPITAL_BASED_OUTPATIENT_CLINIC_OR_DEPARTMENT_OTHER)
Admission: RE | Admit: 2017-01-03 | Discharge: 2017-01-03 | Disposition: A | Discharge status: Home / Self Care | Payer: BLUE CROSS/BLUE SHIELD | Source: Ambulatory Visit | Attending: Emergency Medicine | Admitting: Emergency Medicine

## 2017-01-03 ENCOUNTER — Other Ambulatory Visit (HOSPITAL_BASED_OUTPATIENT_CLINIC_OR_DEPARTMENT_OTHER): Payer: Self-pay | Admitting: Emergency Medicine

## 2017-01-03 DIAGNOSIS — M79672 Pain in left foot: Secondary | ICD-10-CM

## 2017-01-03 DIAGNOSIS — M7732 Calcaneal spur, left foot: Secondary | ICD-10-CM | POA: Diagnosis not present

## 2017-01-09 DIAGNOSIS — M722 Plantar fascial fibromatosis: Secondary | ICD-10-CM | POA: Diagnosis not present

## 2017-01-20 ENCOUNTER — Other Ambulatory Visit: Payer: Self-pay | Admitting: Family Medicine

## 2017-01-22 ENCOUNTER — Other Ambulatory Visit: Payer: Self-pay | Admitting: Neurology

## 2017-01-22 MED ORDER — ONDANSETRON 8 MG PO TBDP: ORAL_TABLET | ORAL | 0 refills | Status: DC

## 2017-01-23 DIAGNOSIS — M722 Plantar fascial fibromatosis: Secondary | ICD-10-CM | POA: Diagnosis not present

## 2017-02-01 ENCOUNTER — Other Ambulatory Visit: Payer: Self-pay | Admitting: Family Medicine

## 2017-02-01 DIAGNOSIS — Z1231 Encounter for screening mammogram for malignant neoplasm of breast: Secondary | ICD-10-CM

## 2017-02-08 DIAGNOSIS — M722 Plantar fascial fibromatosis: Secondary | ICD-10-CM | POA: Diagnosis not present

## 2017-02-12 ENCOUNTER — Ambulatory Visit (HOSPITAL_BASED_OUTPATIENT_CLINIC_OR_DEPARTMENT_OTHER)
Admission: RE | Admit: 2017-02-12 | Discharge: 2017-02-12 | Disposition: A | Discharge status: Home / Self Care | Payer: BLUE CROSS/BLUE SHIELD | Source: Ambulatory Visit | Attending: Family Medicine | Admitting: Family Medicine

## 2017-02-12 DIAGNOSIS — Z1231 Encounter for screening mammogram for malignant neoplasm of breast: Secondary | ICD-10-CM | POA: Diagnosis not present

## 2017-02-14 ENCOUNTER — Other Ambulatory Visit: Payer: Self-pay | Admitting: Orthopedic Surgery

## 2017-02-14 DIAGNOSIS — M722 Plantar fascial fibromatosis: Secondary | ICD-10-CM

## 2017-02-15 ENCOUNTER — Ambulatory Visit
Admission: RE | Admit: 2017-02-15 | Discharge: 2017-02-15 | Disposition: A | Discharge status: Home / Self Care | Payer: BLUE CROSS/BLUE SHIELD | Source: Ambulatory Visit | Attending: Orthopedic Surgery | Admitting: Orthopedic Surgery

## 2017-02-15 DIAGNOSIS — M722 Plantar fascial fibromatosis: Secondary | ICD-10-CM

## 2017-02-15 DIAGNOSIS — M25572 Pain in left ankle and joints of left foot: Secondary | ICD-10-CM | POA: Diagnosis not present

## 2017-02-19 ENCOUNTER — Other Ambulatory Visit: Payer: Self-pay | Admitting: Family Medicine

## 2017-02-19 DIAGNOSIS — F430 Acute stress reaction: Secondary | ICD-10-CM | POA: Diagnosis not present

## 2017-02-19 DIAGNOSIS — F411 Generalized anxiety disorder: Secondary | ICD-10-CM

## 2017-02-19 DIAGNOSIS — I10 Essential (primary) hypertension: Secondary | ICD-10-CM

## 2017-02-19 DIAGNOSIS — M722 Plantar fascial fibromatosis: Secondary | ICD-10-CM | POA: Diagnosis not present

## 2017-02-21 DIAGNOSIS — M9905 Segmental and somatic dysfunction of pelvic region: Secondary | ICD-10-CM | POA: Diagnosis not present

## 2017-02-21 DIAGNOSIS — M461 Sacroiliitis, not elsewhere classified: Secondary | ICD-10-CM | POA: Diagnosis not present

## 2017-02-21 DIAGNOSIS — M5136 Other intervertebral disc degeneration, lumbar region: Secondary | ICD-10-CM | POA: Diagnosis not present

## 2017-02-21 DIAGNOSIS — M9903 Segmental and somatic dysfunction of lumbar region: Secondary | ICD-10-CM | POA: Diagnosis not present

## 2017-03-16 ENCOUNTER — Other Ambulatory Visit: Payer: Self-pay | Admitting: Family Medicine

## 2017-03-19 ENCOUNTER — Ambulatory Visit: Payer: BLUE CROSS/BLUE SHIELD | Admitting: Family Medicine

## 2017-03-21 ENCOUNTER — Ambulatory Visit: Payer: Self-pay | Admitting: Family Medicine

## 2017-03-26 ENCOUNTER — Other Ambulatory Visit (INDEPENDENT_AMBULATORY_CARE_PROVIDER_SITE_OTHER): Payer: BLUE CROSS/BLUE SHIELD

## 2017-03-26 ENCOUNTER — Other Ambulatory Visit: Payer: Self-pay | Admitting: Family Medicine

## 2017-03-26 ENCOUNTER — Encounter: Payer: Self-pay | Admitting: Family Medicine

## 2017-03-26 DIAGNOSIS — Z79899 Other long term (current) drug therapy: Secondary | ICD-10-CM | POA: Diagnosis not present

## 2017-03-26 DIAGNOSIS — F418 Other specified anxiety disorders: Secondary | ICD-10-CM

## 2017-03-26 MED ORDER — LORAZEPAM 0.5 MG PO TABS
0.5000 mg | ORAL_TABLET | Freq: Three times a day (TID) | ORAL | 2 refills | Status: DC | PRN
Start: 2017-03-26 — End: 2017-11-14

## 2017-03-26 NOTE — Telephone Encounter (Signed)
Contacted the patient informed hardcopy is ready, but needs to do UDS/contract before getting this refill. She will schedule appt at that time.

## 2017-03-26 NOTE — Telephone Encounter (Signed)
Please have her fill out UDS and leave a urine sample per office policy. We will continue to try to wean down to lower levels. TY.

## 2017-03-26 NOTE — Telephone Encounter (Signed)
Requesting:  lorazepam Contract   None UDS   None Last OV    09/14/2016 Last Refill   #90 with 2 refills on 11/06/2016  Please Advise

## 2017-03-28 ENCOUNTER — Encounter: Payer: Self-pay | Admitting: Family Medicine

## 2017-03-28 ENCOUNTER — Ambulatory Visit: Payer: BLUE CROSS/BLUE SHIELD | Admitting: Family Medicine

## 2017-03-28 VITALS — BP 108/72 | HR 72 | Temp 98.3°F | Ht 68.0 in | Wt 149.2 lb

## 2017-03-28 DIAGNOSIS — F411 Generalized anxiety disorder: Secondary | ICD-10-CM | POA: Diagnosis not present

## 2017-03-28 MED ORDER — SERTRALINE HCL 50 MG PO TABS: 50.0000 mg | ORAL_TABLET | Freq: Every day | ORAL | 1 refills | Status: DC

## 2017-03-28 NOTE — Progress Notes (Signed)
Pre visit review using our clinic review tool, if applicable. No additional management support is needed unless otherwise documented below in the visit note. 

## 2017-03-28 NOTE — Patient Instructions (Signed)
Send me a MyChart message if you go back to the 25 mg dose of Zoloft.  Let us know if you need anything.

## 2017-03-28 NOTE — Progress Notes (Signed)
Chief Complaint  Patient presents with  . Follow-up    Subjective Beth Frey presents for f/u anxiety/depression.  She is currently being treated with Zoloft 25 mg daily and is doing well.  Reports improvement since treatment, less use of Ativan as we are weaning down. Has failed Cymbalta. Reports healthy diet overall.  Reports getting routine exercise. No thoughts of harming self or others. No self-medication with alcohol, prescription drugs or illicit drugs. She is following with a counselor/psychologist.  ROS Psych: No homicidal or suicidal thoughts  Past Medical History:  Diagnosis Date  . Anxiety   . Back pain, chronic   . Cancer ([place])    skin  . 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 Osteoarthritis    Exam BP 108/72 (BP Location: Left Arm, Patient Position: Sitting, Cuff Size: Normal)   Pulse 72   Temp 98.3 F (36.8 C) (Oral)   Ht 5\' 8"  (1.727 m)   Wt 149 lb 4 oz (67.7 kg)   SpO2 98%   BMI 22.69 kg/m  General:  well developed, well nourished, in no apparent distress Neck: neck supple without adenopathy, thyromegaly, or masses Lungs:  clear to auscultation, breath sounds equal bilaterally, no respiratory distress Cardio:  regular rate and rhythm without murmurs, heart sounds without clicks or rubs Psych: well oriented with normal range of affect and age-appropriate judgement/insight, alert and oriented x4.  Assessment and Plan  GAD (generalized anxiety disorder)  Increase dose of Zoloft from 25 mg daily to 50 mg daily. She will let us know how this goes. She is agreeable to cont to decrease dose of Ativan.  Counseled on diet and exercise affecting mood. F/u in 6 mo or prn. The patient voiced understanding and agreement to the plan.  [place], DO 03/28/17 10:58 AM

## 2017-03-31 LAB — PAIN MGMT, PROFILE 8 W/CONF, U
6 ACETYLMORPHINE: NEGATIVE ng/mL (ref <10)
ALPHAHYDROXYTRIAZOLAM: NEGATIVE ng/mL (ref <50)
AMPHETAMINES: NEGATIVE ng/mL (ref <500)
Alcohol Metabolites: POSITIVE ng/mL — AB (ref <500)
Alphahydroxyalprazolam: NEGATIVE ng/mL (ref <25)
Alphahydroxymidazolam: NEGATIVE ng/mL (ref <50)
Aminoclonazepam: NEGATIVE ng/mL (ref <25)
Benzodiazepines: POSITIVE ng/mL — AB (ref <100)
Buprenorphine, Urine: NEGATIVE ng/mL (ref <5)
Cocaine Metabolite: NEGATIVE ng/mL (ref <150)
Creatinine: 172.9 mg/dL
ETHYL SULFATE (ETS): 19189 ng/mL — AB (ref <100)
Ethyl Glucuronide (ETG): 77020 ng/mL — ABNORMAL HIGH (ref <500)
HYDROXYETHYLFLURAZEPAM: NEGATIVE ng/mL (ref <50)
Lorazepam: >1000 ng/mL — ABNORMAL HIGH (ref <50)
MARIJUANA METABOLITE: NEGATIVE ng/mL (ref <20)
MDMA: NEGATIVE ng/mL (ref <500)
NORDIAZEPAM: NEGATIVE ng/mL (ref <50)
OPIATES: NEGATIVE ng/mL (ref <100)
OXAZEPAM: NEGATIVE ng/mL (ref <50)
OXIDANT: NEGATIVE ug/mL (ref <200)
OXYCODONE: NEGATIVE ng/mL (ref <100)
PH: 6.92 (ref 4.5–9.0)
Temazepam: NEGATIVE ng/mL (ref <50)

## 2017-04-02 ENCOUNTER — Other Ambulatory Visit: Payer: Self-pay | Admitting: Family Medicine

## 2017-04-02 DIAGNOSIS — I10 Essential (primary) hypertension: Secondary | ICD-10-CM

## 2017-04-02 DIAGNOSIS — F411 Generalized anxiety disorder: Secondary | ICD-10-CM

## 2017-04-02 DIAGNOSIS — E876 Hypokalemia: Secondary | ICD-10-CM

## 2017-04-02 MED ORDER — POTASSIUM CHLORIDE ER 10 MEQ PO TBCR: 20.0000 meq | EXTENDED_RELEASE_TABLET | Freq: Two times a day (BID) | ORAL | 2 refills | Status: DC

## 2017-04-02 MED ORDER — CLONIDINE HCL 0.2 MG PO TABS: ORAL_TABLET | ORAL | 1 refills | Status: DC

## 2017-04-18 DIAGNOSIS — M7061 Trochanteric bursitis, right hip: Secondary | ICD-10-CM | POA: Diagnosis not present

## 2017-04-18 DIAGNOSIS — M7062 Trochanteric bursitis, left hip: Secondary | ICD-10-CM | POA: Diagnosis not present

## 2017-05-06 ENCOUNTER — Other Ambulatory Visit: Payer: Self-pay | Admitting: Neurology

## 2017-05-09 ENCOUNTER — Encounter: Payer: Self-pay | Admitting: Family Medicine

## 2017-05-09 MED ORDER — ONDANSETRON 8 MG PO TBDP: ORAL_TABLET | ORAL | 0 refills | Status: DC

## 2017-05-17 ENCOUNTER — Encounter: Payer: Self-pay | Admitting: Family Medicine

## 2017-05-21 DIAGNOSIS — H01004 Unspecified blepharitis left upper eyelid: Secondary | ICD-10-CM | POA: Diagnosis not present

## 2017-05-21 DIAGNOSIS — H01001 Unspecified blepharitis right upper eyelid: Secondary | ICD-10-CM | POA: Diagnosis not present

## 2017-07-23 DIAGNOSIS — M1711 Unilateral primary osteoarthritis, right knee: Secondary | ICD-10-CM | POA: Diagnosis not present

## 2017-07-23 DIAGNOSIS — M25562 Pain in left knee: Secondary | ICD-10-CM | POA: Diagnosis not present

## 2017-07-23 DIAGNOSIS — M170 Bilateral primary osteoarthritis of knee: Secondary | ICD-10-CM | POA: Diagnosis not present

## 2017-07-23 DIAGNOSIS — Z96652 Presence of left artificial knee joint: Secondary | ICD-10-CM | POA: Insufficient documentation

## 2017-07-26 ENCOUNTER — Telehealth: Payer: Self-pay | Admitting: Family Medicine

## 2017-07-26 NOTE — Telephone Encounter (Signed)
Copied from [place] [id number]. Topic: Quick Communication - See Telephone Encounter >> Jul 26, 2017  3:46 PM Genella Rife H wrote: CRM for notification. See Telephone encounter for: 07/26/17.  Left vm to reschedule appt per pcp.

## 2017-08-14 DIAGNOSIS — H2513 Age-related nuclear cataract, bilateral: Secondary | ICD-10-CM | POA: Diagnosis not present

## 2017-08-14 DIAGNOSIS — H04123 Dry eye syndrome of bilateral lacrimal glands: Secondary | ICD-10-CM | POA: Diagnosis not present

## 2017-08-14 DIAGNOSIS — H5213 Myopia, bilateral: Secondary | ICD-10-CM | POA: Diagnosis not present

## 2017-09-24 ENCOUNTER — Ambulatory Visit (HOSPITAL_BASED_OUTPATIENT_CLINIC_OR_DEPARTMENT_OTHER)
Admission: RE | Admit: 2017-09-24 | Discharge: 2017-09-24 | Disposition: A | Discharge status: Home / Self Care | Payer: BLUE CROSS/BLUE SHIELD | Source: Ambulatory Visit | Attending: Emergency Medicine | Admitting: Emergency Medicine

## 2017-09-24 ENCOUNTER — Other Ambulatory Visit (HOSPITAL_BASED_OUTPATIENT_CLINIC_OR_DEPARTMENT_OTHER): Payer: Self-pay | Admitting: Emergency Medicine

## 2017-09-24 DIAGNOSIS — X58XXXA Exposure to other specified factors, initial encounter: Secondary | ICD-10-CM | POA: Insufficient documentation

## 2017-09-24 DIAGNOSIS — S92515A Nondisplaced fracture of proximal phalanx of left lesser toe(s), initial encounter for closed fracture: Secondary | ICD-10-CM | POA: Diagnosis not present

## 2017-09-24 DIAGNOSIS — T1490XA Injury, unspecified, initial encounter: Secondary | ICD-10-CM

## 2017-09-24 DIAGNOSIS — S92512A Displaced fracture of proximal phalanx of left lesser toe(s), initial encounter for closed fracture: Secondary | ICD-10-CM | POA: Insufficient documentation

## 2017-09-26 ENCOUNTER — Ambulatory Visit: Payer: Self-pay | Admitting: Family Medicine

## 2017-09-26 ENCOUNTER — Other Ambulatory Visit: Payer: Self-pay | Admitting: Family Medicine

## 2017-10-02 ENCOUNTER — Encounter: Payer: Self-pay | Admitting: Family Medicine

## 2017-10-02 ENCOUNTER — Ambulatory Visit: Payer: BLUE CROSS/BLUE SHIELD | Admitting: Family Medicine

## 2017-10-02 VITALS — BP 142/90 | HR 69 | Temp 98.4°F | Ht 67.5 in | Wt 151.5 lb

## 2017-10-02 DIAGNOSIS — R233 Spontaneous ecchymoses: Secondary | ICD-10-CM

## 2017-10-02 DIAGNOSIS — R238 Other skin changes: Secondary | ICD-10-CM

## 2017-10-02 DIAGNOSIS — R030 Elevated blood-pressure reading, without diagnosis of hypertension: Secondary | ICD-10-CM

## 2017-10-02 DIAGNOSIS — E876 Hypokalemia: Secondary | ICD-10-CM | POA: Diagnosis not present

## 2017-10-02 DIAGNOSIS — F411 Generalized anxiety disorder: Secondary | ICD-10-CM

## 2017-10-02 LAB — CBC
HEMATOCRIT: 41.4 % (ref 36.0–46.0)
HEMOGLOBIN: 14.5 g/dL (ref 12.0–15.0)
MCHC: 35 g/dL (ref 30.0–36.0)
MCV: 92.2 fl (ref 78.0–100.0)
PLATELETS: 274 10*3/uL (ref 150.0–400.0)
RBC: 4.49 Mil/uL (ref 3.87–5.11)
RDW: 13.1 % (ref 11.5–15.5)
WBC: 9.3 10*3/uL (ref 4.0–10.5)

## 2017-10-02 LAB — COMPREHENSIVE METABOLIC PANEL
ALT: 23 U/L (ref 0–35)
AST: 21 U/L (ref 0–37)
Albumin: 4.4 g/dL (ref 3.5–5.2)
Alkaline Phosphatase: 43 U/L (ref 39–117)
BILIRUBIN TOTAL: 0.8 mg/dL (ref 0.2–1.2)
BUN: 14 mg/dL (ref 6–23)
CALCIUM: 9.6 mg/dL (ref 8.4–10.5)
CO2: 25 meq/L (ref 19–32)
Chloride: 101 mEq/L (ref 96–112)
Creatinine, Ser: 0.8 mg/dL (ref 0.40–1.20)
GFR: 77.49 mL/min (ref >60.00)
Glucose, Bld: 109 mg/dL — ABNORMAL HIGH (ref 70–99)
Potassium: 4.2 mEq/L (ref 3.5–5.1)
Sodium: 138 mEq/L (ref 135–145)
Total Protein: 7.5 g/dL (ref 6.0–8.3)

## 2017-10-02 MED ORDER — POTASSIUM CHLORIDE ER 10 MEQ PO TBCR: 10.0000 meq | EXTENDED_RELEASE_TABLET | Freq: Two times a day (BID) | ORAL | 2 refills | Status: DC

## 2017-10-02 MED ORDER — TRAZODONE HCL 50 MG PO TABS: 25.0000 mg | ORAL_TABLET | Freq: Every evening | ORAL | 3 refills | Status: DC | PRN

## 2017-10-02 NOTE — Progress Notes (Signed)
Chief Complaint  Patient presents with  . Follow-up    Subjective Beth Frey presents for f/u anxiety/depression.  She is currently being treated with Zoloft 50 mg/d and Ativan 0.5 mg tid prn.  Reports improvement since treatment. She is having trouble sleeping intermittently.  She uses Ativan 3-4 times per month to help her sleep.  She has tried Benadryl, melatonin with minimal success. No thoughts of harming self or others. No self-medication with alcohol, prescription drugs or illicit drugs. Pt is not following with a counselor/psychologist.  BP- hx of intermittently elevated BP, not on maintenance meds.  She intermittently checks her blood pressure at home.  She is staying active and her diet is healthy.  Denies any chest pain or shortness of breath.  ROS Psych: No homicidal or suicidal thoughts Heart: No CP  Past Medical History:  Diagnosis Date  . Anxiety   . Back pain, chronic   . Cancer ([place])    skin  . 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 Osteoarthritis    Allergies as of 10/02/2017      Reactions   Sulfa Antibiotics Itching, Swelling   Throat, body swelling   Doxycycline    Sun       Medication List        Accurate as of 10/02/17 12:12 PM. Always use your most recent med list.          cloNIDine 0.2 MG tablet Commonly known as:  CATAPRES TAKE 1 TABLET BY MOUTH THREE TIMES A DAY PATIENT MAY TAKE AN EXTRA TABLET IF BLOOD PRESSURE SPIKES   LORazepam 0.5 MG tablet Commonly known as:  ATIVAN Take 1 tablet (0.5 mg total) every 8 (eight) hours as needed by mouth for anxiety.   mometasone 50 MCG/ACT nasal spray Commonly known as:  NASONEX Place 2 sprays into the nose daily as needed (allergies).   Omega 3 1000 MG Caps Take 1 capsule (1,000 mg total) by mouth daily.   polyethylene glycol packet Commonly known as:  MIRALAX / GLYCOLAX Take 17 g by mouth daily as needed for mild constipation or moderate constipation.   potassium chloride 10 MEQ tablet Commonly known  as:  K-DUR Take 1 tablet (10 mEq total) by mouth 2 (two) times daily.   sertraline 50 MG tablet Commonly known as:  ZOLOFT TAKE 1 TABLET BY MOUTH EVERY DAY   traZODone 50 MG tablet Commonly known as:  DESYREL Take 0.5-1 tablets (25-50 mg total) by mouth at bedtime as needed for sleep.   Vitamin D3 5000 units Caps Take 1 capsule by mouth daily.       Exam BP (!) 142/90 (BP Location: Left Arm, Patient Position: Sitting, Cuff Size: Normal)   Pulse 69   Temp 98.4 F (36.9 C) (Oral)   Ht 5' 7.5" (1.715 m)   Wt 151 lb 8 oz (68.7 kg)   SpO2 97%   BMI 23.38 kg/m  General:  well developed, well nourished, in no apparent distress Neck: neck supple without adenopathy, thyromegaly, or masses Lungs:  clear to auscultation, breath sounds equal bilaterally, no respiratory distress Cardio:  regular rate and rhythm without murmurs, heart sounds without clicks or rubs Psych: well oriented with normal range of affect and age-appropriate judgement/insight, alert and oriented x4.  Assessment and Plan  Generalized anxiety disorder - Plan: traZODone (DESYREL) 50 MG tablet  Elevated blood pressure reading  Hypokalemia - Plan: potassium chloride (K-DUR) 10 MEQ tablet, Comprehensive metabolic panel  Easy bruising - Plan: CBC, Comprehensive metabolic  panel  Orders as above. Try trazodone.  She declined counseling at this time. Check blood pressures at home 3-4 times per week over the next several weeks and let me know still elevated. GYN info provided.  F/u in in 6 months for physical. The patient voiced understanding and agreement to the plan.  [place], DO 10/02/17 12:12 PM

## 2017-10-02 NOTE — Patient Instructions (Addendum)
Check blood pressures at home three times weekly and let me know in 2-3 weeks if too high.   Call Center for [place] at [facility] at [phone] for an appointment.  They are located at [address], [place], [place], [address] (right across the hall from our office).  Let us know if you need anything.

## 2017-10-02 NOTE — Progress Notes (Signed)
Pre visit review using our clinic review tool, if applicable. No additional management support is needed unless otherwise documented below in the visit note. 

## 2017-10-10 DIAGNOSIS — M1712 Unilateral primary osteoarthritis, left knee: Secondary | ICD-10-CM | POA: Diagnosis not present

## 2017-10-17 ENCOUNTER — Encounter: Payer: Self-pay | Admitting: Family Medicine

## 2017-10-17 DIAGNOSIS — M25562 Pain in left knee: Secondary | ICD-10-CM | POA: Diagnosis not present

## 2017-10-21 ENCOUNTER — Emergency Department (HOSPITAL_BASED_OUTPATIENT_CLINIC_OR_DEPARTMENT_OTHER)
Admission: EM | Admit: 2017-10-21 | Discharge: 2017-10-21 | Disposition: A | Discharge status: Home / Self Care | Payer: BLUE CROSS/BLUE SHIELD | Attending: Emergency Medicine | Admitting: Emergency Medicine

## 2017-10-21 ENCOUNTER — Encounter (HOSPITAL_BASED_OUTPATIENT_CLINIC_OR_DEPARTMENT_OTHER): Payer: Self-pay | Admitting: *Deleted

## 2017-10-21 ENCOUNTER — Other Ambulatory Visit: Payer: Self-pay

## 2017-10-21 DIAGNOSIS — T50905A Adverse effect of unspecified drugs, medicaments and biological substances, initial encounter: Secondary | ICD-10-CM | POA: Diagnosis not present

## 2017-10-21 DIAGNOSIS — T7840XA Allergy, unspecified, initial encounter: Secondary | ICD-10-CM | POA: Diagnosis not present

## 2017-10-21 DIAGNOSIS — R51 Headache: Secondary | ICD-10-CM | POA: Diagnosis not present

## 2017-10-21 DIAGNOSIS — T465X5A Adverse effect of other antihypertensive drugs, initial encounter: Secondary | ICD-10-CM | POA: Diagnosis not present

## 2017-10-21 DIAGNOSIS — I10 Essential (primary) hypertension: Secondary | ICD-10-CM | POA: Insufficient documentation

## 2017-10-21 DIAGNOSIS — Z85828 Personal history of other malignant neoplasm of skin: Secondary | ICD-10-CM | POA: Diagnosis not present

## 2017-10-21 DIAGNOSIS — Z79899 Other long term (current) drug therapy: Secondary | ICD-10-CM | POA: Insufficient documentation

## 2017-10-21 DIAGNOSIS — Z96652 Presence of left artificial knee joint: Secondary | ICD-10-CM | POA: Insufficient documentation

## 2017-10-21 DIAGNOSIS — T887XXA Unspecified adverse effect of drug or medicament, initial encounter: Secondary | ICD-10-CM | POA: Diagnosis not present

## 2017-10-21 DIAGNOSIS — Y69 Unspecified misadventure during surgical and medical care: Secondary | ICD-10-CM | POA: Insufficient documentation

## 2017-10-21 LAB — CBC WITH DIFFERENTIAL/PLATELET
BASOS ABS: 0 10*3/uL (ref 0.0–0.1)
Basophils Relative: 0 %
EOS ABS: 0.2 10*3/uL (ref 0.0–0.7)
Eosinophils Relative: 2 %
HCT: 39.8 % (ref 36.0–46.0)
HEMOGLOBIN: 13.9 g/dL (ref 12.0–15.0)
LYMPHS ABS: 5.9 10*3/uL — AB (ref 0.7–4.0)
LYMPHS PCT: 53 %
MCH: 32 pg (ref 26.0–34.0)
MCHC: 34.9 g/dL (ref 30.0–36.0)
MCV: 91.5 fL (ref 78.0–100.0)
Monocytes Absolute: 0.5 10*3/uL (ref 0.1–1.0)
Monocytes Relative: 5 %
Neutro Abs: 4.5 10*3/uL (ref 1.7–7.7)
Neutrophils Relative %: 40 %
Platelets: 262 10*3/uL (ref 150–400)
RBC: 4.35 MIL/uL (ref 3.87–5.11)
RDW: 12.6 % (ref 11.5–15.5)
WBC: 11.1 10*3/uL — AB (ref 4.0–10.5)

## 2017-10-21 LAB — COMPREHENSIVE METABOLIC PANEL WITH GFR
ALT: 25 U/L (ref 14–54)
AST: 25 U/L (ref 15–41)
Albumin: 4.1 g/dL (ref 3.5–5.0)
Alkaline Phosphatase: 47 U/L (ref 38–126)
Anion gap: 12 (ref 5–15)
BUN: 12 mg/dL (ref 6–20)
CO2: 23 mmol/L (ref 22–32)
Calcium: 9.6 mg/dL (ref 8.9–10.3)
Chloride: 106 mmol/L (ref 101–111)
Creatinine, Ser: 0.79 mg/dL (ref 0.44–1.00)
GFR calc Af Amer: >60 mL/min
GFR calc non Af Amer: >60 mL/min
Glucose, Bld: 135 mg/dL — ABNORMAL HIGH (ref 65–99)
Potassium: 3.1 mmol/L — ABNORMAL LOW (ref 3.5–5.1)
Sodium: 141 mmol/L (ref 135–145)
Total Bilirubin: 0.3 mg/dL (ref 0.3–1.2)
Total Protein: 7.4 g/dL (ref 6.5–8.1)

## 2017-10-21 LAB — CBG MONITORING, ED: Glucose-Capillary: 131 mg/dL — ABNORMAL HIGH (ref 65–99)

## 2017-10-21 LAB — CK: Total CK: 170 U/L (ref 38–234)

## 2017-10-21 LAB — TROPONIN I

## 2017-10-21 MED ORDER — SODIUM CHLORIDE 0.9 % IV BOLUS
1000.0000 mL | Freq: Once | INTRAVENOUS | Status: AC
  Administered 2017-10-21: 1000 mL via INTRAVENOUS

## 2017-10-21 MED ORDER — POTASSIUM CHLORIDE CRYS ER 20 MEQ PO TBCR
40.0000 meq | EXTENDED_RELEASE_TABLET | Freq: Once | ORAL | Status: AC
  Administered 2017-10-21: 40 meq via ORAL
  Filled 2017-10-21: qty 2

## 2017-10-21 MED ORDER — LORAZEPAM 2 MG/ML IJ SOLN
1.0000 mg | Freq: Once | INTRAMUSCULAR | Status: AC
  Administered 2017-10-21: 1 mg via INTRAVENOUS
  Filled 2017-10-21: qty 1

## 2017-10-21 NOTE — ED Provider Notes (Signed)
[facility] EMERGENCY DEPARTMENT Provider Note   CSN: [id number] Arrival date & time: 10/21/17  2005     History   Chief Complaint Chief Complaint  Patient presents with  . Allergic Reaction    HPI Beth Frey is a 61 y.o. female.  HPI  62 year old female presents with concern for an allergic reaction.  She is been tremulous and having hypertension.  Her blood pressure has been as high as 093 systolic.  This is been downtrending as she has taken multiple doses of clonidine.  Has been progressive throughout the day.  She is also been very tremulous.  She recently started on trazodone about 3 days ago to help with sleep.  She is been starting to feel tremulous and not quite right since.  Headache has been progressive for about a week.  She denies any focal weakness or numbness.  No chest pain or shortness of breath.  No vomiting.  She feels anxious.  Past Medical History:  Diagnosis Date  . Anxiety   . Back pain, chronic   . Cancer ([place])    skin  . 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 Osteoarthritis     Patient Active Problem List   Diagnosis Date Noted  . Adjustment insomnia 10/31/2016  . Snoring 10/31/2016  . Laxative abuse 07/28/2014  . Cellulitis 10/21/2013  . Cat bite of left lower leg 10/21/2013  . Septic arthritis of knee, left ([place]) 10/21/2013  . RLQ abdominal pain 11/13/2012  . Unspecified constipation 11/13/2012  . Carcinoma in situ of skin of eyelid including canthus 06/26/2012  . Hyperlipidemia 11/23/2011  . Vitamin D deficiency 11/23/2011  . History of abnormal cervical Pap smear 11/23/2011  . Anxiety disorder 11/23/2011  . Thyroid nodule 11/23/2011  . Skin cancer 11/23/2011  . Chronic hypokalemia 11/23/2011  . Incisional hernia 06/14/2011  . Personal history of other malignant neoplasm of skin 02/02/2011    Past Surgical History:  Procedure Laterality Date  . ABLATION SAPHENOUS VEIN W/ RFA    . BREAST SURGERY  August 2011   Implants removed  .  CHOLECYSTECTOMY    . COLON SURGERY  02/20/2010   Right hemicolectomy-cecal bascule  . ELBOW SURGERY     left elbow  . JOINT REPLACEMENT  2006   Left total knee replacement  . OVARIAN CYST REMOVAL     right  . SKIN CANCER EXCISION     multiple-SCC and BCC  . TUBAL LIGATION    . Uterine ablation    . WRIST SURGERY  2009   right wrist     OB History   None      Home Medications    Prior to Admission medications   Medication Sig Start Date End Date Taking? Authorizing Provider  Cholecalciferol (VITAMIN D3) 5000 units CAPS Take 1 capsule by mouth daily.    [provider]  cloNIDine (CATAPRES) 0.2 MG tablet TAKE 1 TABLET BY MOUTH THREE TIMES A DAY PATIENT MAY TAKE AN EXTRA TABLET IF BLOOD PRESSURE SPIKES 04/02/17   Wendling, Crosby Oyster, DO  LORazepam (ATIVAN) 0.5 MG tablet Take 1 tablet (0.5 mg total) every 8 (eight) hours as needed by mouth for anxiety. 03/26/17   Shelda Pal, DO  mometasone (NASONEX) 50 MCG/ACT nasal spray Place 2 sprays into the nose daily as needed (allergies).     [provider]  Omega 3 1000 MG CAPS Take 1 capsule (1,000 mg total) by mouth daily. 07/20/16   Forrest Moron, MD  polyethylene glycol (MIRALAX / GLYCOLAX) packet Take 17 g by mouth daily as needed for mild constipation or moderate constipation.     [provider]  potassium chloride (K-DUR) 10 MEQ tablet Take 1 tablet (10 mEq total) by mouth 2 (two) times daily. 10/02/17   Shelda Pal, DO  sertraline (ZOLOFT) 50 MG tablet TAKE 1 TABLET BY MOUTH EVERY DAY 09/26/17   Shelda Pal, DO    Family History Family History  Problem Relation Age of Onset  . Cancer Father        stomach cancer  . Cancer Brother        pancreatic cancer  . Stroke Maternal Grandmother   . Cancer Maternal Grandfather        lung cancer  . Cancer Paternal Grandmother        ovarian  . Heart disease Paternal Grandfather     Social History Social History    Tobacco Use  . Smoking status: Never Smoker  . Smokeless tobacco: Never Used  Substance Use Topics  . Alcohol use: No    Alcohol/week: 0.0 oz  . Drug use: No     Allergies   Sulfa antibiotics and Doxycycline   Review of Systems Review of Systems  Constitutional: Negative for fever.  Respiratory: Negative for shortness of breath.   Cardiovascular: Negative for chest pain.  Gastrointestinal: Negative for vomiting.  Neurological: Positive for tremors and headaches.  Psychiatric/Behavioral: The patient is nervous/anxious.   All other systems reviewed and are negative.    Physical Exam Updated Vital Signs BP (!) 143/83   Pulse 77   Temp 98.6 F (37 C) (Oral)   Resp (!) 21   Ht 5\' 4"  (1.626 m)   Wt 67.1 kg (148 lb)   SpO2 97%   BMI 25.40 kg/m   Physical Exam  Constitutional: She is oriented to person, place, and time. She appears well-developed and well-nourished. She appears distressed.  HENT:  Head: Normocephalic and atraumatic.  Right Ear: External ear normal.  Left Ear: External ear normal.  Nose: Nose normal.  Eyes: Pupils are equal, round, and reactive to light. EOM are normal. Right eye exhibits no discharge. Left eye exhibits no discharge.  Cardiovascular: Normal rate, regular rhythm and normal heart sounds.  Pulmonary/Chest: Effort normal and breath sounds normal.  Abdominal: Soft. She exhibits no distension. There is no tenderness.  Neurological: She is alert and oriented to person, place, and time. She displays tremor.  CN 3-12 grossly intact. 5/5 strength in all 4 extremities. Grossly normal sensation. Normal finger to nose.  No clonus  Skin: Skin is warm and dry. She is not diaphoretic.  Psychiatric: Her mood appears anxious.  Nursing note and vitals reviewed.    ED Treatments / Results  Labs (all labs ordered are listed, but only abnormal results are displayed) Labs Reviewed  COMPREHENSIVE METABOLIC PANEL - Abnormal; Notable for the following  components:      Result Value   Potassium 3.1 (*)    Glucose, Bld 135 (*)    All other components within normal limits  CBC WITH DIFFERENTIAL/PLATELET - Abnormal; Notable for the following components:   WBC 11.1 (*)    Lymphs Abs 5.9 (*)    All other components within normal limits  CBG MONITORING, ED - Abnormal; Notable for the following components:   Glucose-Capillary 131 (*)    All other components within normal limits  TROPONIN I  CK    EKG EKG Interpretation  Date/Time:  Sunday October 21 2017 20:59:26 EDT Ventricular Rate:  80 PR Interval:    QRS Duration: 90 QT Interval:  405 QTC Calculation: 468 R Axis:   -18 Text Interpretation:  Sinus rhythm Borderline left axis deviation quality improved compared to earlier in the day otherwise no acute ST/T changes, similar to June 2015 Confirmed by Sherwood Gambler [phone]) on 10/21/2017 9:17:50 PM   Radiology No results found.  Procedures Procedures (including critical care time)  Medications Ordered in ED Medications  sodium chloride 0.9 % bolus 1,000 mL (0 mLs Intravenous Stopped 10/21/17 2114)  LORazepam (ATIVAN) injection 1 mg (1 mg Intravenous Given 10/21/17 2028)  potassium chloride SA (K-DUR,KLOR-CON) CR tablet 40 mEq (40 mEq Oral Given 10/21/17 2101)     Initial Impression / Assessment and Plan / ED Course  I have reviewed the triage vital signs and the nursing notes.  Pertinent labs & imaging results that were available during my care of the patient were reviewed by me and considered in my medical decision making (see chart for details).     Patient's symptoms significantly improved with ativan IV, bolus of fluids, and some time. BP has downtrended. Given progressive symptoms, this is likely related to recent trazadone Rx and possible development of serotonin syndrome. However, no clonus noted. No focal neuro deficits. Labs otherwise reassuring besides acute on chronic hypokalemia. She feels well enough for discharge. I  don't think she needs admission at this time. We discussed discontinuing trazadone, drinking plenty of fluids and close PCP f/u. Return precautions.   Final Clinical Impressions(s) / ED Diagnoses   Final diagnoses:  Adverse effect of drug, initial encounter    ED Discharge Orders    None       Sherwood Gambler, MD 10/21/17 2349

## 2017-10-21 NOTE — ED Triage Notes (Addendum)
From registration to room 9, steady gait. EDP into room prior to triage or RN assessment. See MD notes. Here for possible medication reaction, reports high BP, HA, nausea, dizziness, shaky/tremulous, anxious. Answers questions, follows commands. Mentions new Rx for trazodone. Sx onset after recent dose. Also takes zoloft.   Alert, NAD, anxious, tremulous/shaky, interactive, resps e/u, speaking clearly, no dyspnea noted, skin pink W&D, VSS, BP elevated, (denies: fever, sob, recent sickness, VD, bleeding, itching). Family at [facility].

## 2017-10-21 NOTE — ED Notes (Signed)
Dr. Regenia Skeeter into room

## 2017-10-21 NOTE — Discharge Instructions (Addendum)
Stop taking trazodone.  Call your doctor tomorrow to let them know what has happened and for change in medicines on a long-term basis.  Do not drink alcohol as this could make the problem worse.  If you develop worsening headache, tremors, restlessness, or anxiety, or any other new/concerning symptoms then return to the ER for evaluation.  Otherwise follow-up with your primary care physician.

## 2017-10-21 NOTE — ED Notes (Signed)
EDP at [facility]. Pt "feels she is ready for d/c/  to go home". Family at [facility].

## 2017-10-21 NOTE — ED Notes (Signed)
Pt given d/c instructions as per chart. Verbalizes understanding. No questions. 

## 2017-10-22 ENCOUNTER — Encounter: Payer: Self-pay | Admitting: Family Medicine

## 2017-10-22 ENCOUNTER — Other Ambulatory Visit: Payer: Self-pay | Admitting: Family Medicine

## 2017-10-22 DIAGNOSIS — I10 Essential (primary) hypertension: Secondary | ICD-10-CM

## 2017-10-22 DIAGNOSIS — F411 Generalized anxiety disorder: Secondary | ICD-10-CM

## 2017-10-25 ENCOUNTER — Telehealth: Payer: Self-pay | Admitting: Family Medicine

## 2017-10-25 NOTE — Telephone Encounter (Signed)
OK 

## 2017-10-25 NOTE — Telephone Encounter (Signed)
Copied from [place] [id number]. Topic: Quick Communication - See Telephone Encounter >> Oct 25, 2017 12:40 PM Vernona Rieger wrote: CRM for notification. See Telephone encounter for: 10/25/17.  [facility] said [facility] have [facility] several times to get a medication changed to a different form  potassium chloride (K-DUR) 10 MEQ tablet, can not get this to absorb in her system. He wants to know can this be switched to a MICRO K 10MEQ. She thinks the capsule would absorb better for her. Call back @ [phone]

## 2017-10-26 NOTE — Telephone Encounter (Signed)
Called pharmacy spoke to the pharmacist and informed of PCP ok to change.

## 2017-10-29 ENCOUNTER — Emergency Department (HOSPITAL_COMMUNITY)
Admission: EM | Admit: 2017-10-29 | Discharge: 2017-10-29 | Disposition: A | Discharge status: Home / Self Care | Payer: BLUE CROSS/BLUE SHIELD | Attending: Emergency Medicine | Admitting: Emergency Medicine

## 2017-10-29 ENCOUNTER — Encounter (HOSPITAL_COMMUNITY): Payer: Self-pay | Admitting: Emergency Medicine

## 2017-10-29 ENCOUNTER — Emergency Department (HOSPITAL_COMMUNITY): Payer: BLUE CROSS/BLUE SHIELD

## 2017-10-29 DIAGNOSIS — G43909 Migraine, unspecified, not intractable, without status migrainosus: Secondary | ICD-10-CM | POA: Diagnosis not present

## 2017-10-29 DIAGNOSIS — Z79899 Other long term (current) drug therapy: Secondary | ICD-10-CM | POA: Diagnosis not present

## 2017-10-29 DIAGNOSIS — R51 Headache: Secondary | ICD-10-CM

## 2017-10-29 DIAGNOSIS — Z85828 Personal history of other malignant neoplasm of skin: Secondary | ICD-10-CM | POA: Insufficient documentation

## 2017-10-29 DIAGNOSIS — R519 Headache, unspecified: Secondary | ICD-10-CM

## 2017-10-29 LAB — BASIC METABOLIC PANEL
ANION GAP: 10 (ref 5–15)
BUN: 17 mg/dL (ref 6–20)
CALCIUM: 9.7 mg/dL (ref 8.9–10.3)
CHLORIDE: 111 mmol/L (ref 101–111)
CO2: 20 mmol/L — AB (ref 22–32)
Creatinine, Ser: 0.69 mg/dL (ref 0.44–1.00)
GFR calc Af Amer: >60 mL/min (ref >60)
GFR calc non Af Amer: >60 mL/min (ref >60)
GLUCOSE: 111 mg/dL — AB (ref 65–99)
Potassium: 3.6 mmol/L (ref 3.5–5.1)
Sodium: 141 mmol/L (ref 135–145)

## 2017-10-29 LAB — CBC
HEMATOCRIT: 39.8 % (ref 36.0–46.0)
HEMOGLOBIN: 13.2 g/dL (ref 12.0–15.0)
MCH: 31.1 pg (ref 26.0–34.0)
MCHC: 33.2 g/dL (ref 30.0–36.0)
MCV: 93.9 fL (ref 78.0–100.0)
Platelets: 253 10*3/uL (ref 150–400)
RBC: 4.24 MIL/uL (ref 3.87–5.11)
RDW: 12.7 % (ref 11.5–15.5)
WBC: 7.3 10*3/uL (ref 4.0–10.5)

## 2017-10-29 LAB — SEDIMENTATION RATE: Sed Rate: 12 mm/hr (ref 0–22)

## 2017-10-29 MED ORDER — SODIUM CHLORIDE 0.9 % IV SOLN
INTRAVENOUS | Status: DC
  Administered 2017-10-29: 22:00:00 via INTRAVENOUS

## 2017-10-29 MED ORDER — MORPHINE SULFATE (PF) 4 MG/ML IV SOLN
4.0000 mg | Freq: Once | INTRAVENOUS | Status: AC
  Administered 2017-10-29: 4 mg via INTRAVENOUS
  Filled 2017-10-29: qty 1

## 2017-10-29 MED ORDER — PROCHLORPERAZINE EDISYLATE 10 MG/2ML IJ SOLN
10.0000 mg | Freq: Once | INTRAMUSCULAR | Status: AC
  Administered 2017-10-29: 10 mg via INTRAVENOUS
  Filled 2017-10-29: qty 2

## 2017-10-29 MED ORDER — DIPHENHYDRAMINE HCL 50 MG/ML IJ SOLN
12.5000 mg | Freq: Once | INTRAMUSCULAR | Status: AC
  Administered 2017-10-29: 12.5 mg via INTRAVENOUS
  Filled 2017-10-29: qty 1

## 2017-10-29 NOTE — ED Triage Notes (Signed)
Patient c/o headache since last week. Reports left headache worsening today. Reports one episode of vomiting today.

## 2017-10-29 NOTE — ED Notes (Signed)
Bed: WA18 Expected date:  Expected time:  Means of arrival:  Comments: 

## 2017-10-29 NOTE — ED Provider Notes (Signed)
[facility] DEPT Provider Note   CSN: [id number] Arrival date & time: 10/29/17  2104     History   Chief Complaint Chief Complaint  Patient presents with  . Headache    HPI Beth Frey is a 62 y.o. female.  HPI Patient presents to the emergency room for evaluation of headache.  Patient states she started on trazodone a couple weeks ago.  Patient started having trouble with feeling tremulousness and not quite feeling right.  She also started having headache at that time.  She was evaluated in the emergency room and they discontinued her trazodone.  Patient states unfortunately her headache is getting worse.  It is in the front of her head and on the left side.  Patient is concerned that she might have something like a tumor.  She denies any fevers or chills.  She is having some photophobia and some nausea.  No neck pain or stiffness.  Patient does not regularly have headaches.  She did have one episode 7 years ago that prompted her to come to the emergency room but otherwise no other symptoms. Past Medical History:  Diagnosis Date  . Anxiety   . Back pain, chronic   . Cancer ([place])    skin  . 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 Osteoarthritis     Patient Active Problem List   Diagnosis Date Noted  . Adjustment insomnia 10/31/2016  . Snoring 10/31/2016  . Laxative abuse 07/28/2014  . Cellulitis 10/21/2013  . Cat bite of left lower leg 10/21/2013  . Septic arthritis of knee, left ([place]) 10/21/2013  . RLQ abdominal pain 11/13/2012  . Unspecified constipation 11/13/2012  . Carcinoma in situ of skin of eyelid including canthus 06/26/2012  . Hyperlipidemia 11/23/2011  . Vitamin D deficiency 11/23/2011  . History of abnormal cervical Pap smear 11/23/2011  . Anxiety disorder 11/23/2011  . Thyroid nodule 11/23/2011  . Skin cancer 11/23/2011  . Chronic hypokalemia 11/23/2011  . Incisional hernia 06/14/2011  . Personal history of other malignant neoplasm of skin  02/02/2011    Past Surgical History:  Procedure Laterality Date  . ABLATION SAPHENOUS VEIN W/ RFA    . BREAST SURGERY  August 2011   Implants removed  . CHOLECYSTECTOMY    . COLON SURGERY  02/20/2010   Right hemicolectomy-cecal bascule  . ELBOW SURGERY     left elbow  . JOINT REPLACEMENT  2006   Left total knee replacement  . OVARIAN CYST REMOVAL     right  . SKIN CANCER EXCISION     multiple-SCC and BCC  . TUBAL LIGATION    . Uterine ablation    . WRIST SURGERY  2009   right wrist     OB History   None      Home Medications    Prior to Admission medications   Medication Sig Start Date End Date Taking? Authorizing Provider  Cholecalciferol (VITAMIN D3) 5000 units CAPS Take 1 capsule by mouth daily.   Yes [provider]  cloNIDine (CATAPRES) 0.2 MG tablet TAKE 1 TABLET BY MOUTH THREE TIMES A DAY PATIENT MAY TAKE AN EXTRA TABLET IF BLOOD PRESSURE SPIKES 10/22/17  Yes Wendling, Crosby Oyster, DO  LORazepam (ATIVAN) 0.5 MG tablet Take 1 tablet (0.5 mg total) every 8 (eight) hours as needed by mouth for anxiety. 03/26/17  Yes Shelda Pal, DO  mometasone (NASONEX) 50 MCG/ACT nasal spray Place 2 sprays into the nose daily as needed (allergies).    Yes  [provider]  Multiple Vitamin (MULTIVITAMIN WITH MINERALS) TABS tablet Take 1 tablet by mouth daily.   Yes [provider]  ondansetron (ZOFRAN-ODT) 8 MG disintegrating tablet Take 8 mg by mouth every 8 (eight) hours as needed for nausea or vomiting.   Yes [provider]  polyethylene glycol (MIRALAX / GLYCOLAX) packet Take 17 g by mouth daily as needed for mild constipation or moderate constipation.    Yes [provider]  potassium chloride (K-DUR) 10 MEQ tablet Take 1 tablet (10 mEq total) by mouth 2 (two) times daily. Patient taking differently: Take 20 mEq by mouth 2 (two) times daily.  10/02/17  Yes Shelda Pal, DO  sertraline (ZOLOFT) 50 MG tablet TAKE 1  TABLET BY MOUTH EVERY DAY 09/26/17  Yes Wendling, Crosby Oyster, DO  Omega 3 1000 MG CAPS Take 1 capsule (1,000 mg total) by mouth daily. Patient not taking: Reported on 10/29/2017 07/20/16   Forrest Moron, MD    Family History Family History  Problem Relation Age of Onset  . Cancer Father        stomach cancer  . Cancer Brother        pancreatic cancer  . Stroke Maternal Grandmother   . Cancer Maternal Grandfather        lung cancer  . Cancer Paternal Grandmother        ovarian  . Heart disease Paternal Grandfather     Social History Social History   Tobacco Use  . Smoking status: Never Smoker  . Smokeless tobacco: Never Used  Substance Use Topics  . Alcohol use: No    Alcohol/week: 0.0 oz  . Drug use: No     Allergies   Fish allergy; Sulfa antibiotics; Doxycycline; and Trazodone and nefazodone   Review of Systems Review of Systems  All other systems reviewed and are negative.    Physical Exam Updated Vital Signs BP (!) 155/82   Pulse 70   Temp 98.4 F (36.9 C) (Oral)   Resp 16   SpO2 96%   Physical Exam  Constitutional: She appears well-developed and well-nourished. No distress.  HENT:  Head: Normocephalic and atraumatic.  Right Ear: External ear normal.  Left Ear: External ear normal.  ttp left temporal region  Eyes: Conjunctivae are normal. Right eye exhibits no discharge. Left eye exhibits no discharge. No scleral icterus.  Neck: Neck supple. No tracheal deviation present.  Cardiovascular: Normal rate, regular rhythm and intact distal pulses.  Pulmonary/Chest: Effort normal and breath sounds normal. No stridor. No respiratory distress. She has no wheezes. She has no rales.  Abdominal: Soft. Bowel sounds are normal. She exhibits no distension. There is no tenderness. There is no rebound and no guarding.  Musculoskeletal: She exhibits no edema or tenderness.  Neurological: She is alert. She has normal strength. No cranial nerve deficit (no facial  droop, extraocular movements intact, no slurred speech) or sensory deficit. She exhibits normal muscle tone. She displays no seizure activity. Coordination normal.  Skin: Skin is warm and dry. No rash noted.  Psychiatric: Her mood appears anxious.  Nursing note and vitals reviewed.    ED Treatments / Results  Labs (all labs ordered are listed, but only abnormal results are displayed) Labs Reviewed  BASIC METABOLIC PANEL - Abnormal; Notable for the following components:      Result Value   CO2 20 (*)    Glucose, Bld 111 (*)    All other components within normal limits  CBC  SEDIMENTATION RATE    EKG None  Radiology Ct Head Wo Contrast  Result Date: 10/29/2017 CLINICAL DATA:  Severe worsening headache. Vomiting today. No reported injury. EXAM: CT HEAD WITHOUT CONTRAST TECHNIQUE: Contiguous axial images were obtained from the base of the skull through the vertex without intravenous contrast. COMPARISON:  02/08/2011 head CT. FINDINGS: Brain: No evidence of parenchymal hemorrhage or extra-axial fluid collection. No mass lesion, mass effect, or midline shift. No CT evidence of acute infarction. Cerebral volume is age appropriate. No ventriculomegaly. Vascular: No acute abnormality. Skull: No evidence of calvarial fracture. Stable lucent lesions in the posterior calvarium compatible with arachnoid granulations. Sinuses/Orbits: No fluid levels. Mild partial opacification of the ethmoidal air cells bilaterally. Other:  The mastoid air cells are unopacified. IMPRESSION: 1.  No evidence of acute intracranial abnormality. 2. Mild partial opacification of the ethmoidal air cells bilaterally. Electronically Signed   By: Ilona Sorrel M.D.   On: 10/29/2017 22:29    Procedures Procedures (including critical care time)  Medications Ordered in ED Medications  0.9 %  sodium chloride infusion ( Intravenous New Bag/Given 10/29/17 2155)  prochlorperazine (COMPAZINE) injection 10 mg (10 mg Intravenous  Given 10/29/17 2150)  diphenhydrAMINE (BENADRYL) injection 12.5 mg (12.5 mg Intravenous Given 10/29/17 2146)  morphine 4 MG/ML injection 4 mg (4 mg Intravenous Given 10/29/17 2148)     Initial Impression / Assessment and Plan / ED Course  I have reviewed the triage vital signs and the nursing notes.  Pertinent labs & imaging results that were available during my care of the patient were reviewed by me and considered in my medical decision making (see chart for details).  Clinical Course as of Oct 30 2318  Mon Oct 29, 2017  2320 Labs are reassuring.  CT scan is negative for acute abnormality   [JK]    Clinical Course User Index [JK] Dorie Rank, MD    Pt presented to the ED for acute headache.  No history of recurrent headaches.  Pt was concerned about the possibility of tumor.  CT scan is reassuring.   No signs of hemorrhage.  ESR normal.  Doubt temporal arteritis. Sx improved with treatment.  At this time there does not appear to be any evidence of an acute emergency medical condition and the patient appears stable for discharge with appropriate outpatient follow up.    Final Clinical Impressions(s) / ED Diagnoses   Final diagnoses:  Acute nonintractable headache, unspecified headache type    ED Discharge Orders    None       Dorie Rank, MD 10/29/17 2321

## 2017-10-29 NOTE — Discharge Instructions (Addendum)
Continue your current medications, follow up with your primary care doctor, return as needed for worsening symptoms °

## 2017-11-12 ENCOUNTER — Other Ambulatory Visit: Payer: Self-pay | Admitting: Family Medicine

## 2017-11-12 DIAGNOSIS — Z1231 Encounter for screening mammogram for malignant neoplasm of breast: Secondary | ICD-10-CM

## 2017-11-13 ENCOUNTER — Encounter: Payer: Self-pay | Admitting: Family Medicine

## 2017-11-14 MED ORDER — LORAZEPAM 0.5 MG PO TABS: 0.5000 mg | ORAL_TABLET | Freq: Two times a day (BID) | ORAL | 2 refills | Status: DC | PRN

## 2017-12-07 ENCOUNTER — Other Ambulatory Visit: Payer: Self-pay

## 2017-12-07 ENCOUNTER — Encounter: Payer: Self-pay | Admitting: Family Medicine

## 2017-12-07 ENCOUNTER — Ambulatory Visit (INDEPENDENT_AMBULATORY_CARE_PROVIDER_SITE_OTHER): Payer: BLUE CROSS/BLUE SHIELD | Admitting: Family Medicine

## 2017-12-07 VITALS — BP 113/72 | HR 75 | Temp 97.4°F | Ht 67.5 in | Wt 153.0 lb

## 2017-12-07 DIAGNOSIS — Z01419 Encounter for gynecological examination (general) (routine) without abnormal findings: Secondary | ICD-10-CM

## 2017-12-07 DIAGNOSIS — Z1151 Encounter for screening for human papillomavirus (HPV): Secondary | ICD-10-CM | POA: Diagnosis not present

## 2017-12-07 DIAGNOSIS — Z124 Encounter for screening for malignant neoplasm of cervix: Secondary | ICD-10-CM | POA: Diagnosis not present

## 2017-12-07 DIAGNOSIS — R1031 Right lower quadrant pain: Secondary | ICD-10-CM

## 2017-12-07 NOTE — Progress Notes (Signed)
  Subjective:     Beth Frey is a 62 y.o. female and is here for a comprehensive physical exam. The patient reports problems - hot flashes and RLQ pain following hemicolectomy.  The following portions of the patient's history were reviewed and updated as appropriate: allergies, current medications, past family history, past medical history, past social history, past surgical history and problem list.  Review of Systems Pertinent items noted in HPI and remainder of comprehensive ROS otherwise negative.   Objective:    BP 113/72 (BP Location: Left Arm, Patient Position: Sitting, Cuff Size: Normal)   Pulse 75   Temp (!) 97.4 F (36.3 C) (Oral)   Ht 5' 7.5" (1.715 m)   Wt 153 lb (69.4 kg)   BMI 23.61 kg/m  General appearance: alert, cooperative and appears stated age Head: Normocephalic, without obvious abnormality, atraumatic Neck: no adenopathy, supple, symmetrical, trachea midline and thyroid not enlarged, symmetric, no tenderness/mass/nodules Lungs: clear to auscultation bilaterally Breasts: normal appearance, no masses or tenderness Heart: regular rate and rhythm, S1, S2 normal, no murmur, click, rub or gallop Abdomen: soft, non-tender; bowel sounds normal; no masses,  no organomegaly Pelvic: cervix normal in appearance, external genitalia normal, no adnexal masses or tenderness, no cervical motion tenderness, uterus normal size, shape, and consistency and vaginal atrophy Extremities: extremities normal, atraumatic, no cyanosis or edema Pulses: 2+ and symmetric Skin: Skin color, texture, turgor normal. No rashes or lesions Lymph nodes: Cervical, supraclavicular, and axillary nodes normal. Neurologic: Grossly normal    Assessment:    Healthy female exam.      Plan:  Mammogram up to date due 10/19--scheduled F/u with PCP cancer screening (colon, genetic), osteoporosis screening/treatment.     Problem List Items Addressed This Visit      Unprioritized   RLQ abdominal pain     Likely not related to GYN       Other Visit Diagnoses    Pap smear for cervical cancer screening    -  Primary   Relevant Orders   Cytology - PAP   Encounter for gynecological examination without abnormal finding         Return in 1 year (on 12/08/2018).  See After Visit Summary for Counseling Recommendations

## 2017-12-07 NOTE — Patient Instructions (Signed)
Preventive Care 40-64 Years, Female Preventive care refers to lifestyle choices and visits with your health care provider that can promote health and wellness. What does preventive care include?  A yearly physical exam. This is also called an annual well check.  Dental exams once or twice a year.  Routine eye exams. Ask your health care provider how often you should have your eyes checked.  Personal lifestyle choices, including: ? Daily care of your teeth and gums. ? Regular physical activity. ? Eating a healthy diet. ? Avoiding tobacco and drug use. ? Limiting alcohol use. ? Practicing safe sex. ? Taking low-dose aspirin daily starting at age 58. ? Taking vitamin and mineral supplements as recommended by your health care provider. What happens during an annual well check? The services and screenings done by your health care provider during your annual well check will depend on your age, overall health, lifestyle risk factors, and family history of disease. Counseling Your health care provider may ask you questions about your:  Alcohol use.  Tobacco use.  Drug use.  Emotional well-being.  Home and relationship well-being.  Sexual activity.  Eating habits.  Work and work Statistician.  Method of birth control.  Menstrual cycle.  Pregnancy history.  Screening You may have the following tests or measurements:  Height, weight, and BMI.  Blood pressure.  Lipid and cholesterol levels. These may be checked every 5 years, or more frequently if you are over 81 years old.  Skin check.  Lung cancer screening. You may have this screening every year starting at age 78 if you have a 30-pack-year history of smoking and currently smoke or have quit within the past 15 years.  Fecal occult blood test (FOBT) of the stool. You may have this test every year starting at age 65.  Flexible sigmoidoscopy or colonoscopy. You may have a sigmoidoscopy every 5 years or a colonoscopy  every 10 years starting at age 30.  Hepatitis C blood test.  Hepatitis B blood test.  Sexually transmitted disease (STD) testing.  Diabetes screening. This is done by checking your blood sugar (glucose) after you have not eaten for a while (fasting). You may have this done every 1-3 years.  Mammogram. This may be done every 1-2 years. Talk to your health care provider about when you should start having regular mammograms. This may depend on whether you have a family history of breast cancer.  BRCA-related cancer screening. This may be done if you have a family history of breast, ovarian, tubal, or peritoneal cancers.  Pelvic exam and Pap test. This may be done every 3 years starting at age 80. Starting at age 36, this may be done every 5 years if you have a Pap test in combination with an HPV test.  Bone density scan. This is done to screen for osteoporosis. You may have this scan if you are at high risk for osteoporosis.  Discuss your test results, treatment options, and if necessary, the need for more tests with your health care provider. Vaccines Your health care provider may recommend certain vaccines, such as:  Influenza vaccine. This is recommended every year.  Tetanus, diphtheria, and acellular pertussis (Tdap, Td) vaccine. You may need a Td booster every 10 years.  Varicella vaccine. You may need this if you have not been vaccinated.  Zoster vaccine. You may need this after age 5.  Measles, mumps, and rubella (MMR) vaccine. You may need at least one dose of MMR if you were born in  1956 or later. You may also need a second dose.  Pneumococcal 13-valent conjugate (PCV13) vaccine. You may need this if you have certain conditions and were not previously vaccinated.  Pneumococcal polysaccharide (PPSV23) vaccine. You may need one or two doses if you smoke cigarettes or if you have certain conditions.  Meningococcal vaccine. You may need this if you have certain  conditions.  Hepatitis A vaccine. You may need this if you have certain conditions or if you travel or work in places where you may be exposed to hepatitis A.  Hepatitis B vaccine. You may need this if you have certain conditions or if you travel or work in places where you may be exposed to hepatitis B.  Haemophilus influenzae type b (Hib) vaccine. You may need this if you have certain conditions.  Talk to your health care provider about which screenings and vaccines you need and how often you need them. This information is not intended to replace advice given to you by your health care provider. Make sure you discuss any questions you have with your health care provider. Document Released: 05/21/2015 Document Revised: 01/12/2016 Document Reviewed: 02/23/2015 Elsevier Interactive Patient Education  2018 [employer]  

## 2017-12-07 NOTE — Assessment & Plan Note (Signed)
Likely not related to GYN

## 2017-12-10 LAB — CYTOLOGY - PAP
ADEQUACY: ABSENT
Diagnosis: NEGATIVE
HPV: NOT DETECTED

## 2017-12-14 ENCOUNTER — Encounter: Payer: Self-pay | Admitting: Family Medicine

## 2017-12-17 ENCOUNTER — Other Ambulatory Visit: Payer: Self-pay

## 2017-12-17 ENCOUNTER — Encounter (HOSPITAL_COMMUNITY): Payer: Self-pay | Admitting: *Deleted

## 2017-12-17 ENCOUNTER — Emergency Department (HOSPITAL_COMMUNITY)
Admission: EM | Admit: 2017-12-17 | Discharge: 2017-12-18 | Disposition: A | Discharge status: Home / Self Care | Payer: BLUE CROSS/BLUE SHIELD | Attending: Emergency Medicine | Admitting: Emergency Medicine

## 2017-12-17 ENCOUNTER — Emergency Department (HOSPITAL_COMMUNITY): Payer: BLUE CROSS/BLUE SHIELD

## 2017-12-17 DIAGNOSIS — S80212A Abrasion, left knee, initial encounter: Secondary | ICD-10-CM | POA: Diagnosis not present

## 2017-12-17 DIAGNOSIS — Y92009 Unspecified place in unspecified non-institutional (private) residence as the place of occurrence of the external cause: Secondary | ICD-10-CM | POA: Insufficient documentation

## 2017-12-17 DIAGNOSIS — Y999 Unspecified external cause status: Secondary | ICD-10-CM | POA: Diagnosis not present

## 2017-12-17 DIAGNOSIS — S0181XA Laceration without foreign body of other part of head, initial encounter: Secondary | ICD-10-CM | POA: Insufficient documentation

## 2017-12-17 DIAGNOSIS — Y9301 Activity, walking, marching and hiking: Secondary | ICD-10-CM | POA: Diagnosis not present

## 2017-12-17 DIAGNOSIS — S50812A Abrasion of left forearm, initial encounter: Secondary | ICD-10-CM | POA: Diagnosis not present

## 2017-12-17 DIAGNOSIS — S40212A Abrasion of left shoulder, initial encounter: Secondary | ICD-10-CM | POA: Insufficient documentation

## 2017-12-17 DIAGNOSIS — Z79899 Other long term (current) drug therapy: Secondary | ICD-10-CM | POA: Insufficient documentation

## 2017-12-17 DIAGNOSIS — W01198A Fall on same level from slipping, tripping and stumbling with subsequent striking against other object, initial encounter: Secondary | ICD-10-CM | POA: Insufficient documentation

## 2017-12-17 DIAGNOSIS — T07XXXA Unspecified multiple injuries, initial encounter: Secondary | ICD-10-CM

## 2017-12-17 DIAGNOSIS — S0990XA Unspecified injury of head, initial encounter: Secondary | ICD-10-CM | POA: Diagnosis not present

## 2017-12-17 DIAGNOSIS — S0083XA Contusion of other part of head, initial encounter: Secondary | ICD-10-CM

## 2017-12-17 DIAGNOSIS — S0093XA Contusion of unspecified part of head, initial encounter: Secondary | ICD-10-CM | POA: Diagnosis not present

## 2017-12-17 MED ORDER — CEPHALEXIN 500 MG PO CAPS
500.0000 mg | ORAL_CAPSULE | Freq: Once | ORAL | Status: AC
  Administered 2017-12-17: 500 mg via ORAL
  Filled 2017-12-17: qty 1

## 2017-12-17 MED ORDER — LIDOCAINE-EPINEPHRINE (PF) 2 %-1:200000 IJ SOLN
20.0000 mL | Freq: Once | INTRAMUSCULAR | Status: AC
  Administered 2017-12-17: 20 mL via INTRADERMAL

## 2017-12-17 MED ORDER — OXYCODONE-ACETAMINOPHEN 5-325 MG PO TABS
1.0000 | ORAL_TABLET | Freq: Once | ORAL | Status: AC
  Administered 2017-12-17: 1 via ORAL
  Filled 2017-12-17: qty 1

## 2017-12-17 MED ORDER — LIDOCAINE-EPINEPHRINE-TETRACAINE (LET) SOLUTION
3.0000 mL | Freq: Once | NASAL | Status: AC
  Administered 2017-12-17: 3 mL via TOPICAL
  Filled 2017-12-17: qty 3

## 2017-12-17 MED ORDER — BACITRACIN ZINC 500 UNIT/GM EX OINT
TOPICAL_OINTMENT | CUTANEOUS | Status: DC | PRN
  Filled 2017-12-17: qty 4.5

## 2017-12-17 MED ORDER — LIDOCAINE-EPINEPHRINE (PF) 2 %-1:200000 IJ SOLN
INTRAMUSCULAR | Status: AC
  Administered 2017-12-17: 20 mL via INTRADERMAL
  Filled 2017-12-17: qty 20

## 2017-12-17 NOTE — ED Triage Notes (Signed)
Pt sustained a fall around 1630, she hit her left face, left shoulder and left knee area. 2 tylenol around 1900 for pain.

## 2017-12-17 NOTE — ED Notes (Signed)
Bed: WA09 Expected date:  Expected time:  Means of arrival:  Comments: 

## 2017-12-17 NOTE — Telephone Encounter (Signed)
Author phoned pt. to f/u on RLQ pain for which pt. made an appointment with Dr. Nani Ravens. Pt. denies fever, and stated symptoms have improved. "I think I have lesions, scar tissue, and I thought maybe my potassium pills are changing the color of my stool". Author reminded pt. About 8/15 appointment, and pt. Verbalized understanding. Routed to Dr. Nani Ravens as Juluis Rainier.

## 2017-12-17 NOTE — ED Provider Notes (Signed)
[facility] DEPT Provider Note   CSN: [id number] Arrival date & time: 12/17/17  2139     History   Chief Complaint Chief Complaint  Patient presents with  . Fall    HPI Beth Frey is a 62 y.o. female.  HPI   She presents for evaluation of injuries to face, shoulder and left knee, and a fall around 5 PM tonight.  Tripped while carrying garbage out and fell face forward onto concrete.  She decided to seek help later after she contacted her plastic surgeon in [facility].  She had a facelift done 3 weeks ago, and subsequently had a chemical peel on the face, neck and upper chest.  She came here by private vehicle.  She denies loss of consciousness, blurred vision, nausea, vomiting, trismus, dental injury or neck pain.  She states her last tetanus booster was 4 years ago.  There are no other known modifying factors.  Past Medical History:  Diagnosis Date  . Anxiety   . Back pain, chronic   . Cancer ([place])    skin  . 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 Osteoarthritis     Patient Active Problem List   Diagnosis Date Noted  . Adjustment insomnia 10/31/2016  . Snoring 10/31/2016  . Laxative abuse 07/28/2014  . Septic arthritis of knee, left ([place]) 10/21/2013  . RLQ abdominal pain 11/13/2012  . Unspecified constipation 11/13/2012  . Carcinoma in situ of skin of eyelid including canthus 06/26/2012  . Hyperlipidemia 11/23/2011  . Vitamin D deficiency 11/23/2011  . History of abnormal cervical Pap smear 11/23/2011  . Anxiety disorder 11/23/2011  . Thyroid nodule 11/23/2011  . Skin cancer 11/23/2011  . Chronic hypokalemia 11/23/2011  . Incisional hernia 06/14/2011  . Personal history of other malignant neoplasm of skin 02/02/2011    Past Surgical History:  Procedure Laterality Date  . ABLATION SAPHENOUS VEIN W/ RFA    . BREAST SURGERY  August 2011   Implants removed  . CHOLECYSTECTOMY    . COLON SURGERY  02/20/2010   Right  hemicolectomy-cecal bascule  . ELBOW SURGERY     left elbow  . JOINT REPLACEMENT  2006   Left total knee replacement  . OVARIAN CYST REMOVAL     right  . SKIN CANCER EXCISION     multiple-SCC and BCC  . TUBAL LIGATION    . Uterine ablation    . WRIST SURGERY  2009   right wrist     OB History    Gravida  0   Para  0   Term  0   Preterm  0   AB  0   Living  0     SAB  0   TAB  0   Ectopic  0   Multiple  0   Live Births  0            Home Medications    Prior to Admission medications   Medication Sig Start Date End Date Taking? Authorizing Provider  Cholecalciferol (VITAMIN D3) 5000 units CAPS Take 1 capsule by mouth daily.   Yes [provider]  cloNIDine (CATAPRES) 0.2 MG tablet TAKE 1 TABLET BY MOUTH THREE TIMES A DAY PATIENT MAY TAKE AN EXTRA TABLET IF BLOOD PRESSURE SPIKES Patient taking differently: Take 0.2 mg by mouth 3 (three) times daily. TAKE 1 TABLET BY MOUTH THREE TIMES A DAY PATIENT MAY TAKE AN EXTRA TABLET IF BLOOD PRESSURE SPIKES 10/22/17  Yes Wendling,  Crosby Oyster, DO  LORazepam (ATIVAN) 1 MG tablet Take 1 mg by mouth at bedtime as needed for sleep.   Yes [provider]  mometasone (NASONEX) 50 MCG/ACT nasal spray Place 2 sprays into the nose daily as needed (allergies).    Yes [provider]  Multiple Vitamin (MULTIVITAMIN WITH MINERALS) TABS tablet Take 1 tablet by mouth daily.   Yes [provider]  Omega 3 1000 MG CAPS Take 1 capsule (1,000 mg total) by mouth daily. 07/20/16  Yes Stallings, Zoe A, MD  polyethylene glycol (MIRALAX / GLYCOLAX) packet Take 17 g by mouth daily as needed for mild constipation or moderate constipation.    Yes [provider]  potassium chloride (K-DUR) 10 MEQ tablet Take 1 tablet (10 mEq total) by mouth 2 (two) times daily. Patient taking differently: Take 20 mEq by mouth 2 (two) times daily.  10/02/17  Yes Shelda Pal, DO  sertraline (ZOLOFT) 50 MG  tablet TAKE 1 TABLET BY MOUTH EVERY DAY 09/26/17  Yes Wendling, Crosby Oyster, DO  cephALEXin (KEFLEX) 500 MG capsule Take 1 capsule (500 mg total) by mouth 4 (four) times daily. 12/18/17   Daleen Bo, MD  HYDROcodone-acetaminophen (NORCO) 5-325 MG tablet Take 1 tablet by mouth every 4 (four) hours as needed for moderate pain. 12/18/17   Daleen Bo, MD  LORazepam (ATIVAN) 0.5 MG tablet Take 1 tablet (0.5 mg total) by mouth 2 (two) times daily as needed for anxiety. Patient not taking: Reported on 12/17/2017 11/14/17   Shelda Pal, DO    Family History Family History  Problem Relation Age of Onset  . Cancer Mother        multiple myeloma  . Parkinson's disease Mother   . Cancer Father        stomach cancer  . Cancer Brother        pancreatic cancer  . Stroke Maternal Grandmother   . Cancer Maternal Grandfather        lung cancer  . Cancer Paternal Grandmother        ovarian  . Heart disease Paternal Grandfather   . Cancer Brother        multiple myeloma    Social History Social History   Tobacco Use  . Smoking status: Never Smoker  . Smokeless tobacco: Never Used  Substance Use Topics  . Alcohol use: No    Alcohol/week: 0.0 standard drinks  . Drug use: No     Allergies   Fish allergy; Sulfa antibiotics; Doxycycline; and Trazodone and nefazodone   Review of Systems Review of Systems  All other systems reviewed and are negative.    Physical Exam Updated Vital Signs BP (!) 164/92   Pulse 76   Temp 97.9 F (36.6 C) (Oral)   Resp 18   SpO2 100%   Physical Exam  Constitutional: She is oriented to person, place, and time. She appears well-developed and well-nourished. She appears distressed (She is uncomfortable).  HENT:  Head: Normocephalic.  Contusion left zygoma region without crepitation or deformity.  No trismus.  Laceration left cheek, 1 cm triangle shape, with apparent full thickness skin loss.  Lateral to that is a linear superficial  laceration about 3 and half centimeters.  No active bleeding.  Small abrasion by left ala.  No midface crepitation, instability or deformity.  Eyes: Pupils are equal, round, and reactive to light. Conjunctivae and EOM are normal.  Neck: Normal range of motion and phonation normal. Neck supple.  Cardiovascular: Normal  rate and regular rhythm.  Pulmonary/Chest: Effort normal. She exhibits no tenderness.  Musculoskeletal: Normal range of motion.  Mild swelling left knee with normal range of motion, no gross instability.  She ambulates with a normal gait.  Neurological: She is alert and oriented to person, place, and time. She exhibits normal muscle tone.  Skin: Skin is warm and dry.  Superficial abrasions left shoulder, left forearm, left knee.  Psychiatric: She has a normal mood and affect. Her behavior is normal. Judgment and thought content normal.  Nursing note and vitals reviewed.    ED Treatments / Results  Labs (all labs ordered are listed, but only abnormal results are displayed) Labs Reviewed - No data to display  EKG None  Radiology No results found.  Procedures Procedures (including critical care time)  Medications Ordered in ED Medications  bacitracin ointment (has no administration in time range)  lidocaine-EPINEPHrine-tetracaine (LET) solution (3 mLs Topical Given by Other 12/17/17 2252)  oxyCODONE-acetaminophen (PERCOCET/ROXICET) 5-325 MG per tablet 1 tablet (1 tablet Oral Given 12/17/17 2302)  lidocaine-EPINEPHrine (XYLOCAINE W/EPI) 2 %-1:200000 (PF) injection 20 mL (20 mLs Intradermal Given 12/17/17 2302)  cephALEXin (KEFLEX) capsule 500 mg (500 mg Oral Given 12/17/17 2342)     Initial Impression / Assessment and Plan / ED Course  I have reviewed the triage vital signs and the nursing notes.  Pertinent labs & imaging results that were available during my care of the patient were reviewed by me and considered in my medical decision making (see chart for  details).      Patient Vitals for the past 24 hrs:  BP Temp Temp src Pulse Resp SpO2  12/17/17 2143 (!) 164/92 97.9 F (36.6 C) Oral 76 18 100 %   10:15 PM-case discussed with maxillofacial trauma surgeon Dr. Mancel Parsons who will come to the ED to evaluate and treat the patient  12:04 AM Reevaluation with update and discussion. After initial assessment and treatment, an updated evaluation reveals she is fairly comfortable now.  Her facial wounds have been addressed and sutured by the maxillofacial trauma surgeon.  Findings discussed with the patient and all questions answered. Daleen Bo   Medical Decision Making: Trip and fall with facial contusion and lacerations.  No deep tissue laceration, complication from recent facelift or suspected bony injuries.  Multiple abrasions are present.  Doubt spine injury or myelopathy.  No indication for further evaluation and treatment this time.  CRITICAL CARE-no Performed by: Daleen Bo  Nursing Notes Reviewed/ Care Coordinated Applicable Imaging Reviewed Interpretation of Laboratory Data incorporated into ED treatment  The patient appears reasonably screened and/or stabilized for discharge and I doubt any other medical condition or other [facility] requiring further screening, evaluation, or treatment in the ED at this time prior to discharge.  Plan: Home Medications-continue usual medications; Home Treatments-wound care at home; return here if the recommended treatment, does not improve the symptoms; Recommended follow up-PCP, PRN _0  Final Clinical Impressions(s) / ED Diagnoses   Final diagnoses:  Facial laceration, initial encounter  Contusion of face, initial encounter  Abrasion, multiple sites  Contusion, multiple sites    ED Discharge Orders         Ordered    cephALEXin (KEFLEX) 500 MG capsule  4 times daily     12/18/17 0002    HYDROcodone-acetaminophen (NORCO) 5-325 MG tablet  Every 4 hours PRN     12/18/17 0002           Daleen Bo, MD 12/18/17 0006

## 2017-12-18 DIAGNOSIS — S0181XA Laceration without foreign body of other part of head, initial encounter: Secondary | ICD-10-CM | POA: Diagnosis not present

## 2017-12-18 MED ORDER — CEPHALEXIN 500 MG PO CAPS: 500.0000 mg | ORAL_CAPSULE | Freq: Four times a day (QID) | ORAL | 0 refills | Status: DC

## 2017-12-18 MED ORDER — HYDROCODONE-ACETAMINOPHEN 5-325 MG PO TABS: 1.0000 | ORAL_TABLET | ORAL | 0 refills | Status: DC | PRN

## 2017-12-18 NOTE — Consult Note (Signed)
Reason for Consult: left facial lacerations  Referring Physician: Dr. Julious Oka is an 62 y.o. female.  HPI: She presents for evaluation of injuries to face, shoulder and left knee, and a fall around 5 PM tonight.  Tripped while carrying garbage out and fell face forward onto concrete.  She decided to seek help later after she contacted her plastic surgeon in [facility].  She had a facelift done 3 weeks ago, and subsequently had a chemical peel on the face, neck and upper chest.  She came here by private vehicle.  She denies loss of consciousness, blurred vision, nausea, vomiting, trismus, dental injury or neck pain.   Past Medical History:  Diagnosis Date  . Anxiety   . Back pain, chronic   . Cancer ([place])    skin  . 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 Osteoarthritis     Past Surgical History:  Procedure Laterality Date  . ABLATION SAPHENOUS VEIN W/ RFA    . BREAST SURGERY  August 2011   Implants removed  . CHOLECYSTECTOMY    . COLON SURGERY  02/20/2010   Right hemicolectomy-cecal bascule  . ELBOW SURGERY     left elbow  . JOINT REPLACEMENT  2006   Left total knee replacement  . OVARIAN CYST REMOVAL     right  . SKIN CANCER EXCISION     multiple-SCC and BCC  . TUBAL LIGATION    . Uterine ablation    . WRIST SURGERY  2009   right wrist    Family History  Problem Relation Age of Onset  . Cancer Mother        multiple myeloma  . Parkinson's disease Mother   . Cancer Father        stomach cancer  . Cancer Brother        pancreatic cancer  . Stroke Maternal Grandmother   . Cancer Maternal Grandfather        lung cancer  . Cancer Paternal Grandmother        ovarian  . Heart disease Paternal Grandfather   . Cancer Brother        multiple myeloma    Social History:  reports that she has never smoked. She has never used smokeless tobacco. She reports that she does not drink alcohol or use drugs.  Allergies:  Allergies  Allergen Reactions  . Fish  Allergy Anaphylaxis  . Sulfa Antibiotics Itching and Swelling    Throat, body swelling  . Doxycycline     Sun   . Trazodone And Nefazodone Other (See Comments)    Serotonin syndrome d/t other medications    Medications: I have reviewed the patient's current medications.  No results found for this or any previous visit (from the past 48 hour(s)).  No results found.  ROS: other than HPI, neg Blood pressure (!) 156/84, pulse 72, temperature 98.1 F (36.7 C), temperature source Oral, resp. rate 18, SpO2 99 %. Physical Exam  Gen: awake, alert in nad HEENT: PERRL, EOMI, mild left facial edema/contusion; 2cm triangular shaped avulsive laceration to left malar area, 3.5cm superficial vertical abrasion posterior to avulsive injury. There is another 2.5cm horizontal superfical abrasion in left inferior periobital area. No bony step off defects, no crepitus. Occlusion stable. Oropharynx clear.  Assessment/Plan: 62 y/o F s/p fall with 2cm complex triangular shaped avulsive laceration to left malar area and multiple superficial abrasions. She is s/p 3 wks facelift procedure/chemical peal. Plan for repair of laceration at bedside.  *  Postop Recommendations: 1. Keep wound covered with steri-strips, Bacitracin to superfical abrasions. 2. Keflex 59m tid x 5 days. 3. Follow up at [facility] call [phone] schedule for suture removal.  JMichael Litter DMD Oral & maxillofacial Surgery 12/18/2017, 12:27 AM

## 2017-12-18 NOTE — Discharge Instructions (Signed)
Use ice on the sore spots 3 or 4 times a day for 2 days after that use heat.  Clean the abrasions with soap and water daily.  Call Dr. Mancel Parsons, for a follow-up appointment.  Prescriptions were sent to your pharmacy.

## 2017-12-18 NOTE — Procedures (Signed)
Preoperative Dx: left complex avulsive facial laceration.  Postoperative Dx: s/p laceration repair.  Procedures: 1. Repair of complex 2.0cm left malar laceration - layered closure  Surgeon: Maxie Better, DMD  Procedure: The patient was anesthetized with 3cc of 2% lidocaine with 1:100,000 epi in the left malar area. The wound was thoroughly cleansed with saline using a pressure syringe/shield. the wound margins were undermined with a dissecting scissor and released to aid in reapproximation.  Once this was completed, 4-0 Vicryl sutures were utilized to reapproximate the subcutaneous tissues.  The skin was then reapproximated with multiple 5-0 Prolene sutures.  Following repairs, the wounds were cleansed again and a thin coat of Bacitracin ointment was applied.  The wound was then reinforced with multiple Steri-Strips the patient tolerated the procedure well.  Complications: None  EBL: <5cc  Disposition: 1. Plan to remove sutures in 7-10 days. 2. Please see Oral & Maxillofacial Surgery Consult note for recommendations, wound care, and follow up information.  Maxie Better, DMD Oral & Maxillofacial Surgery

## 2017-12-20 ENCOUNTER — Encounter: Payer: Self-pay | Admitting: Family Medicine

## 2017-12-20 ENCOUNTER — Ambulatory Visit: Payer: BLUE CROSS/BLUE SHIELD | Admitting: Family Medicine

## 2017-12-20 VITALS — BP 114/80 | HR 83 | Temp 98.7°F | Ht 67.5 in | Wt 153.5 lb

## 2017-12-20 DIAGNOSIS — R1031 Right lower quadrant pain: Secondary | ICD-10-CM | POA: Diagnosis not present

## 2017-12-20 NOTE — Progress Notes (Signed)
Pre visit review using our clinic review tool, if applicable. No additional management support is needed unless otherwise documented below in the visit note. 

## 2017-12-20 NOTE — Patient Instructions (Signed)
If you do not hear anything about your CT scan in the next several days, call our office and ask for an update.  If anything new comes up with this, let me know.

## 2017-12-20 NOTE — Progress Notes (Signed)
Chief Complaint  Patient presents with  . Flank Pain    right lower quadrant    Beth Frey is here for abdominal pain.  Duration: 3 months  RLQ, hx of issue with cecum in 2011 where she was rushed ot surg.  Did bring up with GYN who does not think it is gynecologic.  Nighttime awakenings? No Bleeding? Nothing new (has hx of hemorhhoids) Weight loss? No Palliation: Nothing Provocation: No triggers She has a strong family history of malignancy. Associated symptoms: none Denies: fever, nausea, vomiting, inability to keep down fluids, unintentional weight loss, and bowel changes Treatment to date: none Can last from several minutes to 4-6 hrs.  She also has a history of pelvic swelling secondary to a cecal issue where her appendix was removed.  ROS: Constitutional: No fevers GI: No N/V/D/C, no bleeding + pain  Past Medical History:  Diagnosis Date  . Anxiety   . Back pain, chronic   . Cancer ([place])    skin  . 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 Osteoarthritis    Family History  Problem Relation Age of Onset  . Cancer Mother        multiple myeloma  . Parkinson's disease Mother   . Cancer Father        stomach cancer  . Cancer Brother        pancreatic cancer  . Stroke Maternal Grandmother   . Cancer Maternal Grandfather        lung cancer  . Cancer Paternal Grandmother        ovarian  . Heart disease Paternal Grandfather   . Cancer Brother        multiple myeloma   Past Surgical History:  Procedure Laterality Date  . ABLATION SAPHENOUS VEIN W/ RFA    . BREAST SURGERY  August 2011   Implants removed  . CHOLECYSTECTOMY    . COLON SURGERY  02/20/2010   Right hemicolectomy-cecal bascule  . ELBOW SURGERY     left elbow  . JOINT REPLACEMENT  2006   Left total knee replacement  . OVARIAN CYST REMOVAL     right  . SKIN CANCER EXCISION     multiple-SCC and BCC  . TUBAL LIGATION    . Uterine ablation    . WRIST SURGERY  2009   right wrist    BP 114/80 (BP Location:  Left Arm, Patient Position: Sitting, Cuff Size: Normal)   Pulse 83   Temp 98.7 F (37.1 C) (Oral)   Ht 5' 7.5" (1.715 m)   Wt 153 lb 8 oz (69.6 kg)   SpO2 96%   BMI 23.69 kg/m  Gen.: Awake, alert, appears stated age 62: Mucous membranes moist without mucosal lesions Heart: Regular rate and rhythm without murmurs Lungs: Clear auscultation bilaterally, no rales or wheezing, normal effort without accessory muscle use. Abdomen: Bowel sounds are present. Abdomen is soft, nontender, nondistended, no masses or organomegaly. Negative Murphy's, Rovsing's, McBurney's, and Carnett's sign. Psych: Age appropriate judgment and insight. Normal mood and affect.  Right lower quadrant abdominal pain - Plan: CT Abdomen Pelvis W Contrast  Orders as above.  Renal function reviewed. F/u pending results. Pt voiced understanding and agreement to the plan.  [place], DO 12/20/17 12:04 PM

## 2017-12-24 ENCOUNTER — Encounter: Payer: Self-pay | Admitting: Family Medicine

## 2017-12-24 ENCOUNTER — Other Ambulatory Visit: Payer: Self-pay | Admitting: Family Medicine

## 2017-12-24 DIAGNOSIS — R1031 Right lower quadrant pain: Secondary | ICD-10-CM

## 2017-12-24 NOTE — Telephone Encounter (Signed)
Suzette from [employer] is [employer] authorization and written orders faxed to [facility] and imaging [phone]

## 2017-12-25 ENCOUNTER — Other Ambulatory Visit (INDEPENDENT_AMBULATORY_CARE_PROVIDER_SITE_OTHER): Payer: BLUE CROSS/BLUE SHIELD

## 2017-12-25 DIAGNOSIS — R1031 Right lower quadrant pain: Secondary | ICD-10-CM | POA: Diagnosis not present

## 2017-12-25 LAB — BASIC METABOLIC PANEL
BUN: 11 mg/dL (ref 6–23)
CALCIUM: 9.7 mg/dL (ref 8.4–10.5)
CHLORIDE: 103 meq/L (ref 96–112)
CO2: 27 meq/L (ref 19–32)
CREATININE: 0.77 mg/dL (ref 0.40–1.20)
GFR: 80.93 mL/min (ref >60.00)
GLUCOSE: 121 mg/dL — AB (ref 70–99)
Potassium: 3.4 mEq/L — ABNORMAL LOW (ref 3.5–5.1)
Sodium: 140 mEq/L (ref 135–145)

## 2017-12-26 ENCOUNTER — Other Ambulatory Visit: Payer: Self-pay | Admitting: Family Medicine

## 2017-12-26 DIAGNOSIS — E876 Hypokalemia: Secondary | ICD-10-CM

## 2017-12-28 ENCOUNTER — Encounter: Payer: Self-pay | Admitting: Family Medicine

## 2017-12-31 ENCOUNTER — Other Ambulatory Visit: Payer: Self-pay | Admitting: Family Medicine

## 2017-12-31 DIAGNOSIS — M25562 Pain in left knee: Secondary | ICD-10-CM

## 2018-01-01 ENCOUNTER — Ambulatory Visit (HOSPITAL_BASED_OUTPATIENT_CLINIC_OR_DEPARTMENT_OTHER)
Admission: RE | Admit: 2018-01-01 | Discharge: 2018-01-01 | Disposition: A | Discharge status: Home / Self Care | Payer: BLUE CROSS/BLUE SHIELD | Source: Ambulatory Visit | Attending: Family Medicine | Admitting: Family Medicine

## 2018-01-01 ENCOUNTER — Encounter: Payer: Self-pay | Admitting: Family Medicine

## 2018-01-01 DIAGNOSIS — Z9889 Other specified postprocedural states: Secondary | ICD-10-CM | POA: Diagnosis not present

## 2018-01-01 DIAGNOSIS — S8992XA Unspecified injury of left lower leg, initial encounter: Secondary | ICD-10-CM | POA: Diagnosis not present

## 2018-01-01 DIAGNOSIS — M25562 Pain in left knee: Secondary | ICD-10-CM | POA: Diagnosis not present

## 2018-01-02 ENCOUNTER — Ambulatory Visit
Admission: RE | Admit: 2018-01-02 | Discharge: 2018-01-02 | Disposition: A | Discharge status: Home / Self Care | Payer: BLUE CROSS/BLUE SHIELD | Source: Ambulatory Visit | Attending: Family Medicine | Admitting: Family Medicine

## 2018-01-02 ENCOUNTER — Encounter: Payer: Self-pay | Admitting: Family Medicine

## 2018-01-02 DIAGNOSIS — R1031 Right lower quadrant pain: Secondary | ICD-10-CM | POA: Diagnosis not present

## 2018-01-02 MED ORDER — IOPAMIDOL (ISOVUE-300) INJECTION 61%
100.0000 mL | Freq: Once | INTRAVENOUS | Status: AC | PRN
  Administered 2018-01-02: 100 mL via INTRAVENOUS

## 2018-01-04 ENCOUNTER — Other Ambulatory Visit (INDEPENDENT_AMBULATORY_CARE_PROVIDER_SITE_OTHER): Payer: BLUE CROSS/BLUE SHIELD

## 2018-01-04 DIAGNOSIS — E876 Hypokalemia: Secondary | ICD-10-CM

## 2018-01-04 LAB — BASIC METABOLIC PANEL
BUN: 15 mg/dL (ref 6–23)
CHLORIDE: 101 meq/L (ref 96–112)
CO2: 24 mEq/L (ref 19–32)
Calcium: 10.1 mg/dL (ref 8.4–10.5)
Creatinine, Ser: 0.78 mg/dL (ref 0.40–1.20)
GFR: 79.72 mL/min (ref >60.00)
Glucose, Bld: 135 mg/dL — ABNORMAL HIGH (ref 70–99)
POTASSIUM: 4 meq/L (ref 3.5–5.1)
Sodium: 137 mEq/L (ref 135–145)

## 2018-01-14 DIAGNOSIS — M25562 Pain in left knee: Secondary | ICD-10-CM | POA: Diagnosis not present

## 2018-01-21 DIAGNOSIS — M25562 Pain in left knee: Secondary | ICD-10-CM | POA: Diagnosis not present

## 2018-02-13 ENCOUNTER — Ambulatory Visit (HOSPITAL_BASED_OUTPATIENT_CLINIC_OR_DEPARTMENT_OTHER): Payer: Self-pay

## 2018-02-14 DIAGNOSIS — M170 Bilateral primary osteoarthritis of knee: Secondary | ICD-10-CM | POA: Diagnosis not present

## 2018-02-14 DIAGNOSIS — M25562 Pain in left knee: Secondary | ICD-10-CM | POA: Diagnosis not present

## 2018-02-14 DIAGNOSIS — M7061 Trochanteric bursitis, right hip: Secondary | ICD-10-CM | POA: Diagnosis not present

## 2018-02-15 ENCOUNTER — Ambulatory Visit (HOSPITAL_BASED_OUTPATIENT_CLINIC_OR_DEPARTMENT_OTHER)
Admission: RE | Admit: 2018-02-15 | Discharge: 2018-02-15 | Disposition: A | Discharge status: Home / Self Care | Payer: BLUE CROSS/BLUE SHIELD | Source: Ambulatory Visit | Attending: Family Medicine | Admitting: Family Medicine

## 2018-02-15 ENCOUNTER — Encounter (HOSPITAL_BASED_OUTPATIENT_CLINIC_OR_DEPARTMENT_OTHER): Payer: Self-pay

## 2018-02-15 DIAGNOSIS — Z1231 Encounter for screening mammogram for malignant neoplasm of breast: Secondary | ICD-10-CM | POA: Diagnosis not present

## 2018-02-22 DIAGNOSIS — M25551 Pain in right hip: Secondary | ICD-10-CM | POA: Diagnosis not present

## 2018-03-18 ENCOUNTER — Encounter: Payer: Self-pay | Admitting: Family Medicine

## 2018-03-18 DIAGNOSIS — E876 Hypokalemia: Secondary | ICD-10-CM

## 2018-03-19 ENCOUNTER — Other Ambulatory Visit: Payer: Self-pay | Admitting: Family Medicine

## 2018-03-19 NOTE — Telephone Encounter (Signed)
Please advise- we have K-Dur 66meq 1 tab bid on med list.

## 2018-03-20 ENCOUNTER — Other Ambulatory Visit: Payer: Self-pay | Admitting: Family Medicine

## 2018-03-20 ENCOUNTER — Encounter: Payer: Self-pay | Admitting: Family Medicine

## 2018-03-20 MED ORDER — POTASSIUM CHLORIDE ER 10 MEQ PO CPCR: 10.0000 meq | ORAL_CAPSULE | Freq: Three times a day (TID) | ORAL | 0 refills | Status: DC

## 2018-03-20 MED ORDER — POTASSIUM CHLORIDE ER 10 MEQ PO TBCR: 20.0000 meq | EXTENDED_RELEASE_TABLET | Freq: Three times a day (TID) | ORAL | 2 refills | Status: DC

## 2018-03-22 IMAGING — DX DG TOE 5TH 2+V*R*
3 series · 3 of 3 positions shown · non-contrast
Comparison: Right foot films of 09/05/2015

CLINICAL DATA: Stubbed right fifth toe several times

EXAM:
RIGHT FIFTH TOE

[toe ap]
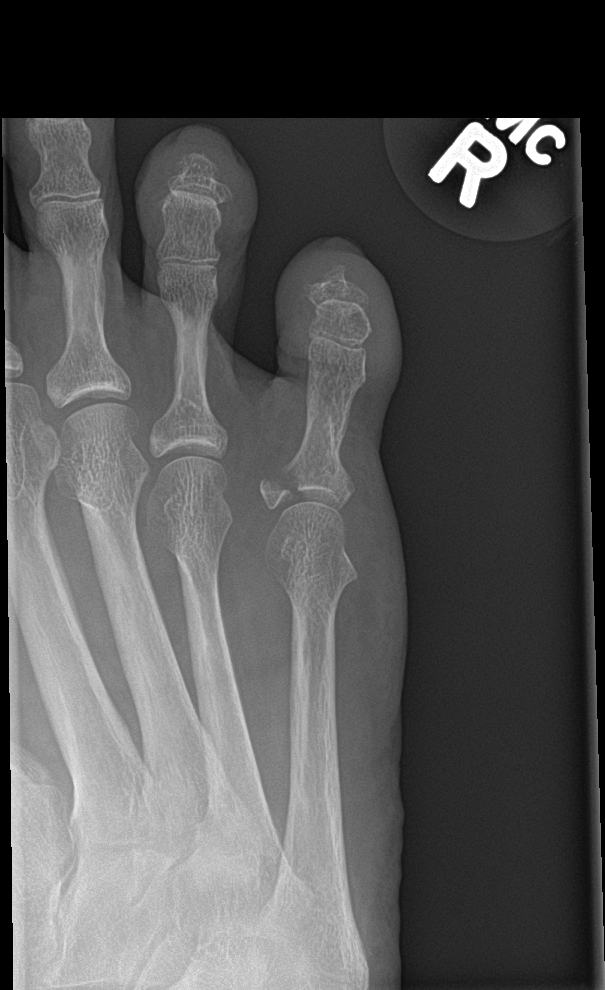

[toe obl]
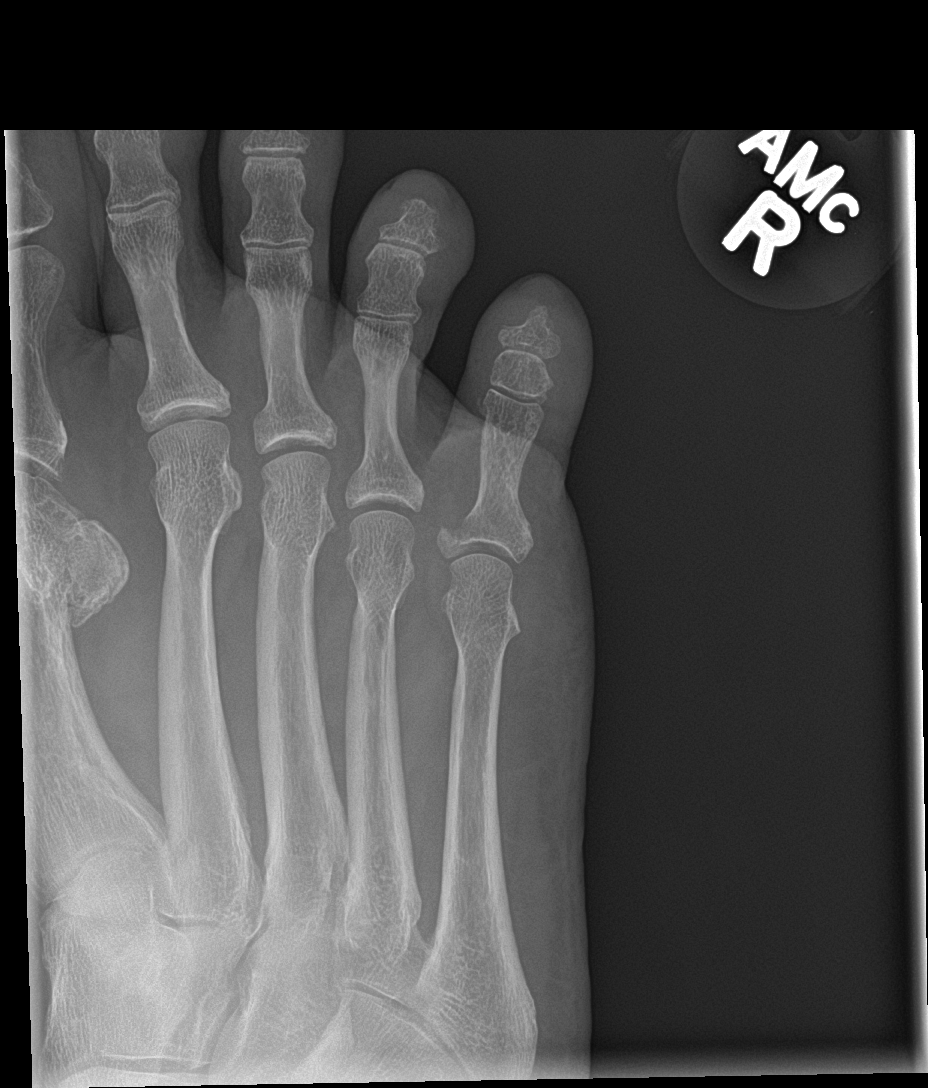

[toe lat]
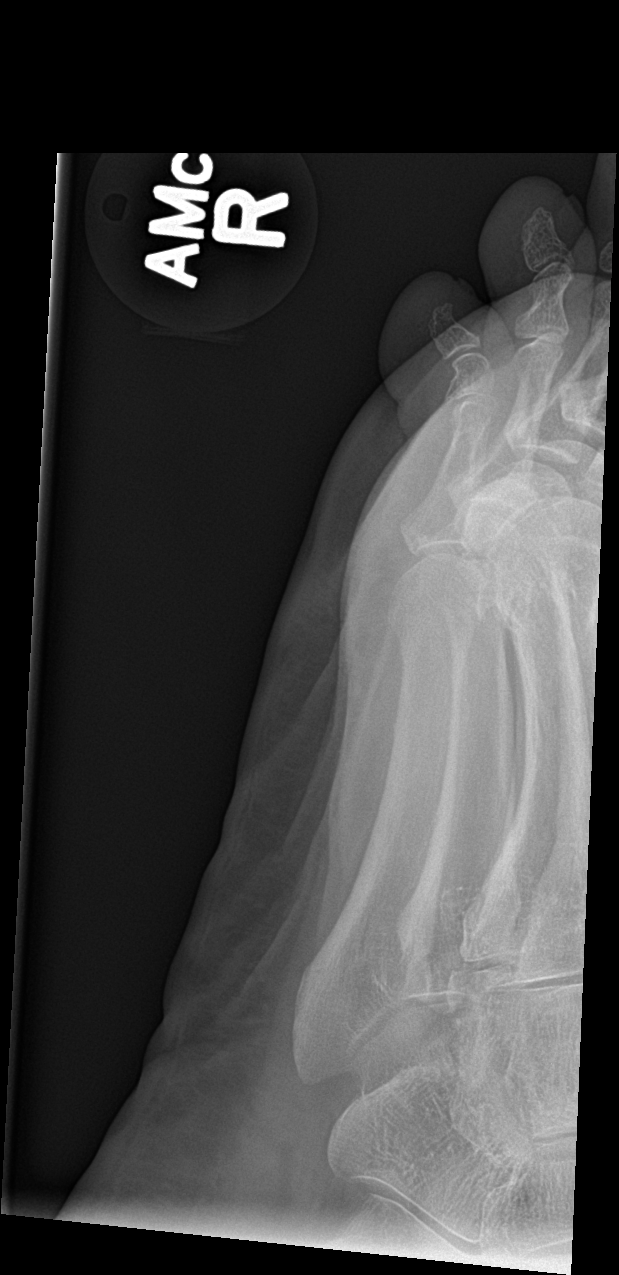

[3 of 3 positions shown; findings below may reference images not displayed]

FINDINGS: There is a displaced fracture through the base of the proximal
phalanx of the right fifth toe medially. This fracture does involve
the joint space. No other acute abnormality is seen.
IMPRESSION: Displaced avulsion fragment from the base of the proximal phalanx of
the right fifth toe.

## 2018-04-01 ENCOUNTER — Other Ambulatory Visit: Payer: Self-pay | Admitting: Family Medicine

## 2018-04-01 DIAGNOSIS — I10 Essential (primary) hypertension: Secondary | ICD-10-CM

## 2018-04-01 DIAGNOSIS — F411 Generalized anxiety disorder: Secondary | ICD-10-CM

## 2018-04-14 ENCOUNTER — Other Ambulatory Visit: Payer: Self-pay | Admitting: Family Medicine

## 2018-04-18 ENCOUNTER — Other Ambulatory Visit: Payer: Self-pay | Admitting: Family Medicine

## 2018-04-18 ENCOUNTER — Encounter: Payer: Self-pay | Admitting: Family Medicine

## 2018-04-18 ENCOUNTER — Ambulatory Visit (INDEPENDENT_AMBULATORY_CARE_PROVIDER_SITE_OTHER): Payer: BLUE CROSS/BLUE SHIELD | Admitting: Family Medicine

## 2018-04-18 VITALS — BP 110/80 | HR 68 | Temp 97.9°F | Ht 68.0 in | Wt 152.0 lb

## 2018-04-18 DIAGNOSIS — Z79899 Other long term (current) drug therapy: Secondary | ICD-10-CM

## 2018-04-18 DIAGNOSIS — Z0000 Encounter for general adult medical examination without abnormal findings: Secondary | ICD-10-CM

## 2018-04-18 DIAGNOSIS — E785 Hyperlipidemia, unspecified: Secondary | ICD-10-CM

## 2018-04-18 LAB — COMPREHENSIVE METABOLIC PANEL
ALT: 29 U/L (ref 0–35)
AST: 25 U/L (ref 0–37)
Albumin: 4.4 g/dL (ref 3.5–5.2)
Alkaline Phosphatase: 40 U/L (ref 39–117)
BILIRUBIN TOTAL: 0.6 mg/dL (ref 0.2–1.2)
BUN: 18 mg/dL (ref 6–23)
CALCIUM: 10 mg/dL (ref 8.4–10.5)
CO2: 29 mEq/L (ref 19–32)
Chloride: 97 mEq/L (ref 96–112)
Creatinine, Ser: 0.76 mg/dL (ref 0.40–1.20)
GFR: 82.07 mL/min (ref >60.00)
Glucose, Bld: 106 mg/dL — ABNORMAL HIGH (ref 70–99)
Potassium: 3.9 mEq/L (ref 3.5–5.1)
Sodium: 134 mEq/L — ABNORMAL LOW (ref 135–145)
TOTAL PROTEIN: 7 g/dL (ref 6.0–8.3)

## 2018-04-18 LAB — LIPID PANEL
CHOL/HDL RATIO: 5
CHOLESTEROL: 310 mg/dL — AB (ref 0–200)
HDL: 67.1 mg/dL (ref >39.00)
LDL CALC: 216 mg/dL — AB (ref 0–99)
NonHDL: 243.15
TRIGLYCERIDES: 135 mg/dL (ref 0.0–149.0)
VLDL: 27 mg/dL (ref 0.0–40.0)

## 2018-04-18 MED ORDER — POTASSIUM CHLORIDE ER 10 MEQ PO CPCR: 10.0000 meq | ORAL_CAPSULE | Freq: Three times a day (TID) | ORAL | 2 refills | Status: DC

## 2018-04-18 NOTE — Progress Notes (Signed)
Pre visit review using our clinic review tool, if applicable. No additional management support is needed unless otherwise documented below in the visit note. 

## 2018-04-18 NOTE — Progress Notes (Signed)
Chief Complaint  Patient presents with  . Annual Exam     Well Woman Beth Frey is here for a complete physical.   Her last physical was >1 year ago.  Current diet: in general, a "healthy" diet.  Current exercise: walking. Weight is stable and she denies daytime fatigue. No LMP recorded. Patient is postmenopausal..  Seatbelt? Yes  Health Maintenance Pap/HPV- Yes Mammogram- Yes Tetanus- Yes Hep C screening- Yes HIV screening- Yes- 25 years ago  Past Medical History:  Diagnosis Date  . Anxiety   . Back pain, chronic   . Cancer ([place])    skin  . 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 Osteoarthritis      Past Surgical History:  Procedure Laterality Date  . ABLATION SAPHENOUS VEIN W/ RFA    . BREAST SURGERY  August 2011   Implants removed  . CHOLECYSTECTOMY    . COLON SURGERY  02/20/2010   Right hemicolectomy-cecal bascule  . ELBOW SURGERY     left elbow  . JOINT REPLACEMENT  2006   Left total knee replacement  . OVARIAN CYST REMOVAL     right  . SKIN CANCER EXCISION     multiple-SCC and BCC  . TUBAL LIGATION    . Uterine ablation    . WRIST SURGERY  2009   right wrist    Medications  Current Outpatient Medications on File Prior to Visit  Medication Sig Dispense Refill  . Cholecalciferol (VITAMIN D3) 5000 units CAPS Take 1 capsule by mouth daily.    . cloNIDine (CATAPRES) 0.2 MG tablet TAKE 1 TABLET BY MOUTH THREE TIMES A DAY PATIENT MAY TAKE AN EXTRA TABLET IF BLOOD PRESSURE SPIKES 270 tablet 1  . LORazepam (ATIVAN) 0.5 MG tablet Take 1 tablet (0.5 mg total) by mouth 2 (two) times daily as needed for anxiety. 60 tablet 2  . mometasone (NASONEX) 50 MCG/ACT nasal spray Place 2 sprays into the nose daily as needed (allergies).     . Multiple Vitamin (MULTIVITAMIN WITH MINERALS) TABS tablet Take 1 tablet by mouth daily.    . Omega 3 1000 MG CAPS Take 1 capsule (1,000 mg total) by mouth daily. 90 each   . polyethylene glycol (MIRALAX / GLYCOLAX) packet Take 17 g by mouth daily  as needed for mild constipation or moderate constipation.     . potassium chloride (MICRO-K) 10 MEQ CR capsule TAKE 1 CAPSULE (10 MEQ TOTAL) BY MOUTH 3 (THREE) TIMES DAILY. 90 capsule 0  . sertraline (ZOLOFT) 50 MG tablet TAKE 1 TABLET BY MOUTH EVERY DAY 90 tablet 1   Allergies Allergies  Allergen Reactions  . Fish Allergy Anaphylaxis  . Sulfa Antibiotics Itching and Swelling    Throat, body swelling  . Doxycycline     Sun   . Trazodone And Nefazodone Other (See Comments)    Serotonin syndrome d/t other medications    Review of Systems: Constitutional:  no unexpected weight changes Eye:  no recent significant change in vision Ear/Nose/Mouth/Throat:  Ears:  no tinnitus or vertigo and no recent change in hearing Nose/Mouth/Throat:  no complaints of nasal congestion, no sore throat Cardiovascular: no chest pain Respiratory:  no cough and no shortness of breath Gastrointestinal:  no abdominal pain, no change in bowel habits GU:  Female: negative for dysuria or pelvic pain Musculoskeletal/Extremities: +OA in hands/wrists Integumentary (Skin/Breast):  no abnormal skin lesions reported Neurologic:  no headaches Endocrine:  denies fatigue Psych: +anxiety  Exam BP 110/80 (BP Location: Left Arm, Patient Position:  Sitting, Cuff Size: Normal)   Pulse 68   Temp 97.9 F (36.6 C) (Oral)   Ht 5\' 8"  (1.727 m)   Wt 152 lb (68.9 kg)   SpO2 95%   BMI 23.11 kg/m  General:  well developed, well nourished, in no apparent distress Skin:  no significant moles, warts, or growths Head:  no masses, lesions, or tenderness Eyes:  pupils equal and round, sclera anicteric without injection Ears:  canals without lesions, TMs shiny without retraction, no obvious effusion, no erythema Nose:  nares patent, septum midline, mucosa normal, and no drainage or sinus tenderness Throat/Pharynx:  lips and gingiva without lesion; tongue and uvula midline; non-inflamed pharynx; no exudates or postnasal  drainage Neck: neck supple without adenopathy, thyromegaly, or masses Lungs:  clear to auscultation, breath sounds equal bilaterally, no respiratory distress Cardio:  regular rate and rhythm, no bruits, no LE edema Abdomen:  abdomen soft, nontender; bowel sounds normal; no masses or organomegaly Genital: Defer to GYN Musculoskeletal:  symmetrical muscle groups noted without atrophy or deformity Extremities:  no clubbing, cyanosis, or edema, no deformities, no skin discoloration Neuro:  gait normal; deep tendon reflexes normal and symmetric Psych: well oriented with normal range of affect and appropriate judgment/insight  Assessment and Plan  Well adult exam - Plan: Comprehensive metabolic panel, Lipid panel  Encounter for long-term (current) use of high-risk medication - Plan: Pain Mgmt, Profile 8 w/Conf, U   Well 62 y.o. female. Counseled on diet and exercise. Refused flu shot. Other orders as above. UDS and pain contract renewed. Follow up in 6 mo. The patient voiced understanding and agreement to the plan.  [place], DO 04/18/18 11:08 AM

## 2018-04-18 NOTE — Patient Instructions (Addendum)
Give [place] 2-3 business days to get the results of your labs back.   Sleep is important to [place] all. Getting good sleep is imperative to adequate functioning during the day. Work with our counselors who are trained to help people obtain quality sleep. Call [phone] to schedule an appointment or if you are curious about insurance coverage/cost.  Keep the diet clean and stay active.   Let us know if you need anything.

## 2018-04-21 LAB — PAIN MGMT, PROFILE 8 W/CONF, U
6 ACETYLMORPHINE: NEGATIVE ng/mL (ref <10)
ALCOHOL METABOLITES: POSITIVE ng/mL — AB (ref <500)
ALPHAHYDROXYTRIAZOLAM: NEGATIVE ng/mL (ref <50)
AMPHETAMINES: NEGATIVE ng/mL (ref <500)
Alphahydroxyalprazolam: NEGATIVE ng/mL (ref <25)
Alphahydroxymidazolam: NEGATIVE ng/mL (ref <50)
Aminoclonazepam: NEGATIVE ng/mL (ref <25)
Benzodiazepines: POSITIVE ng/mL — AB (ref <100)
Buprenorphine, Urine: NEGATIVE ng/mL (ref <5)
COCAINE METABOLITE: NEGATIVE ng/mL (ref <150)
Creatinine: 139.1 mg/dL
ETHYL SULFATE (ETS): 5579 ng/mL — AB (ref <100)
Ethyl Glucuronide (ETG): 20256 ng/mL — ABNORMAL HIGH (ref <500)
HYDROXYETHYLFLURAZEPAM: NEGATIVE ng/mL (ref <50)
LORAZEPAM: 779 ng/mL — AB (ref <50)
MARIJUANA METABOLITE: NEGATIVE ng/mL (ref <20)
MDMA: NEGATIVE ng/mL (ref <500)
NORDIAZEPAM: NEGATIVE ng/mL (ref <50)
OXAZEPAM: NEGATIVE ng/mL (ref <50)
OXIDANT: NEGATIVE ug/mL (ref <200)
OXYCODONE: NEGATIVE ng/mL (ref <100)
Opiates: NEGATIVE ng/mL (ref <100)
Temazepam: NEGATIVE ng/mL (ref <50)
pH: 6.72 (ref 4.5–9.0)

## 2018-04-22 MED ORDER — ATORVASTATIN CALCIUM 20 MG PO TABS: 20.0000 mg | ORAL_TABLET | Freq: Every day | ORAL | 3 refills | Status: DC

## 2018-04-22 NOTE — Telephone Encounter (Signed)
Mod dose of Lipitor called in. Let's ck LFT's in [redacted] weeks along w lipid panel already ordered. RE, please schedule lab appt. Orders placed. TY.

## 2018-06-06 ENCOUNTER — Other Ambulatory Visit: Payer: Self-pay

## 2018-06-11 ENCOUNTER — Other Ambulatory Visit (INDEPENDENT_AMBULATORY_CARE_PROVIDER_SITE_OTHER): Payer: BLUE CROSS/BLUE SHIELD

## 2018-06-11 DIAGNOSIS — E785 Hyperlipidemia, unspecified: Secondary | ICD-10-CM | POA: Diagnosis not present

## 2018-06-11 LAB — HEPATIC FUNCTION PANEL
ALT: 30 U/L (ref 0–35)
AST: 25 U/L (ref 0–37)
Albumin: 4.3 g/dL (ref 3.5–5.2)
Alkaline Phosphatase: 34 U/L — ABNORMAL LOW (ref 39–117)
Bilirubin, Direct: 0.1 mg/dL (ref 0.0–0.3)
Total Bilirubin: 0.6 mg/dL (ref 0.2–1.2)
Total Protein: 6.8 g/dL (ref 6.0–8.3)

## 2018-06-11 LAB — LIPID PANEL
Cholesterol: 274 mg/dL — ABNORMAL HIGH (ref 0–200)
HDL: 72.1 mg/dL (ref >39.00)
LDL Cholesterol: 184 mg/dL — ABNORMAL HIGH (ref 0–99)
NonHDL: 202.2
Total CHOL/HDL Ratio: 4
Triglycerides: 93 mg/dL (ref 0.0–149.0)
VLDL: 18.6 mg/dL (ref 0.0–40.0)

## 2018-06-12 ENCOUNTER — Encounter: Payer: Self-pay | Admitting: Family Medicine

## 2018-07-01 ENCOUNTER — Ambulatory Visit (INDEPENDENT_AMBULATORY_CARE_PROVIDER_SITE_OTHER): Payer: BLUE CROSS/BLUE SHIELD

## 2018-07-01 ENCOUNTER — Ambulatory Visit: Payer: BLUE CROSS/BLUE SHIELD | Admitting: Family Medicine

## 2018-07-01 ENCOUNTER — Telehealth: Payer: Self-pay

## 2018-07-01 ENCOUNTER — Encounter: Payer: Self-pay | Admitting: Family Medicine

## 2018-07-01 VITALS — BP 150/90 | HR 74 | Temp 97.8°F | Resp 22 | Ht 68.0 in | Wt 153.6 lb

## 2018-07-01 DIAGNOSIS — R062 Wheezing: Secondary | ICD-10-CM | POA: Diagnosis not present

## 2018-07-01 DIAGNOSIS — J40 Bronchitis, not specified as acute or chronic: Secondary | ICD-10-CM

## 2018-07-01 DIAGNOSIS — R918 Other nonspecific abnormal finding of lung field: Secondary | ICD-10-CM | POA: Diagnosis not present

## 2018-07-01 MED ORDER — AZITHROMYCIN 250 MG PO TABS: ORAL_TABLET | ORAL | 0 refills | Status: DC

## 2018-07-01 MED ORDER — PREDNISONE 20 MG PO TABS: 40.0000 mg | ORAL_TABLET | Freq: Every day | ORAL | 0 refills | Status: DC

## 2018-07-01 MED ORDER — ALBUTEROL SULFATE (2.5 MG/3ML) 0.083% IN NEBU
2.5000 mg | INHALATION_SOLUTION | Freq: Once | RESPIRATORY_TRACT | Status: AC
  Administered 2018-07-01: 2.5 mg via RESPIRATORY_TRACT

## 2018-07-01 NOTE — Patient Instructions (Signed)
Honey is great for cough. One tablespoon/day Cool mist humidifier flonase nightly Steroids! You have to do this for 5 days Inhaler. Do this scheduled the first 2 days Zpack.   Acute Bronchitis, Adult Acute bronchitis is when air tubes (bronchi) in the lungs suddenly get swollen. The condition can make it hard to breathe. It can also cause these symptoms:  A cough.  Coughing up clear, yellow, or green mucus.  Wheezing.  Chest congestion.  Shortness of breath.  A fever.  Body aches.  Chills.  A sore throat. Follow these instructions at home:  Medicines  Take over-the-counter and prescription medicines only as told by your doctor.  If you were prescribed an antibiotic medicine, take it as told by your doctor. Do not stop taking the antibiotic even if you start to feel better. General instructions  Rest.  Drink enough fluids to keep your pee (urine) pale yellow.  Avoid smoking and secondhand smoke. If you smoke and you need help quitting, ask your doctor. Quitting will help your lungs heal faster.  Use an inhaler, cool mist vaporizer, or humidifier as told by your doctor.  Keep all follow-up visits as told by your doctor. This is important. How is this prevented? To lower your risk of getting this condition again:  Wash your hands often with soap and water. If you cannot use soap and water, use hand sanitizer.  Avoid contact with people who have cold symptoms.  Try not to touch your hands to your mouth, nose, or eyes.  Make sure to get the flu shot every year. Contact a doctor if:  Your symptoms do not get better in 2 weeks. Get help right away if:  You cough up blood.  You have chest pain.  You have very bad shortness of breath.  You become dehydrated.  You faint (pass out) or keep feeling like you are going to pass out.  You keep throwing up (vomiting).  You have a very bad headache.  Your fever or chills gets worse. This information is not  intended to replace advice given to you by your health care provider. Make sure you discuss any questions you have with your health care provider. Document Released: 10/11/2007 Document Revised: 12/06/2016 Document Reviewed: 10/13/2015 Elsevier Interactive Patient Education  2019 [employer].

## 2018-07-01 NOTE — Progress Notes (Signed)
Patient: Beth Frey MRN: [id number] DOB: 1955-07-28 PCP: Shelda Pal, DO     Subjective:  Chief Complaint  Patient presents with  . Cough  . Shortness of Breath  . Wheezing    HPI: The patient is a 63 y.o. female who presents today for cough, shortness of breath and wheezing x 3 weeks. She had a ruptured TM in her left ear at the end of January. After this she started to have bronchitis type symptoms with a cough and shortness of breath. She feels like she can't pull air in on the way. She normally walks on the treadmill for 30-45 minutes and can hardly do 15 minutes. She is just really tired. She has had no fever, chills, sinus pain or pressure. She does hear some wheezing. She has taken mucinex generic. She is drinking hot tea. No sick contacts. Her husband is an ER doctor. She has no asthma/copd. She has a remote history of smoking only a few when her mom had PD.   Review of Systems  Constitutional: Positive for fatigue. Negative for chills and fever.  HENT: Positive for ear pain.        C/o right ear pain  Eyes: Negative for photophobia and pain.  Respiratory: Positive for cough and wheezing.   Cardiovascular: Negative for chest pain and palpitations.  Gastrointestinal: Negative for abdominal pain and nausea.  Musculoskeletal: Positive for back pain.  Neurological: Negative for dizziness and headaches.  Psychiatric/Behavioral: Negative for sleep disturbance.    Allergies Patient is allergic to fish allergy; sulfa antibiotics; doxycycline; and trazodone and nefazodone.  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  Surgical History Patient  has a past surgical history that includes Wrist surgery (2009); Elbow surgery; Ovarian cyst removal; Skin cancer excision; Uterine ablation; Tubal ligation; Cholecystectomy; Colon surgery (02/20/2010); Joint replacement (2006);  Breast surgery (August 2011); and Ablation saphenous vein w/ RFA.  Family History Pateint's family history includes Cancer in her brother, brother, father, maternal grandfather, mother, and paternal grandmother; Heart disease in her paternal grandfather; Parkinson's disease in her mother; Stroke in her maternal grandmother.  Social History Patient  reports that she has never smoked. She has never used smokeless tobacco. She reports that she does not drink alcohol or use drugs.    Objective: Vitals:   07/01/18 1405  BP: (!) 150/90  Pulse: 74  Resp: (!) 22  Temp: 97.8 F (36.6 C)  TempSrc: Oral  SpO2: 98%  Weight: 153 lb 9.6 oz (69.7 kg)  Height: 5\' 8"  (1.727 m)    Body mass index is 23.35 kg/m.  Physical Exam Vitals signs reviewed.  Constitutional:      Appearance: She is well-developed.  HENT:     Head: Normocephalic and atraumatic.     Mouth/Throat:     Mouth: Mucous membranes are moist.     Comments: Mild cobblestoning  Neck:     Musculoskeletal: Normal range of motion and neck supple.  Cardiovascular:     Rate and Rhythm: Normal rate and regular rhythm.     Heart sounds: Normal heart sounds.  Pulmonary:     Effort: Pulmonary effort is normal.     Breath sounds: Examination of the right-upper field reveals wheezing. Examination of the left-upper field reveals wheezing. Examination of the right-middle field reveals wheezing. Examination of the left-middle field reveals wheezing. Wheezing present. No decreased breath sounds, rhonchi or rales.  Abdominal:  General: Bowel sounds are normal.     Palpations: Abdomen is soft.  Lymphadenopathy:     Cervical: No cervical adenopathy.  Skin:    General: Skin is warm and dry.     Capillary Refill: Capillary refill takes less than 2 seconds.  Neurological:     General: No focal deficit present.     Mental Status: She is alert.       CXR: no consolidation. Airway disease. Official read pending  Wheezing resolved post  nebulizer treatment.   Assessment/plan: 1. Bronchitis Appears to be airway disease on CXR. No clear consolidation. Still giving zpack since so symptomatic and with prolonged history. Also discussed symptomatic treatment with rest, fluids, honey, cool mist humidifier and flonase.  - DG Chest 2 View - albuterol (PROVENTIL) (2.5 MG/3ML) 0.083% nebulizer solution 2.5 mg  2. Wheezing She is quite wheezy. Steroid burst, neb in office and albuterol inhaler. Precautions given. If not better f/u with PCP. If worsening shortness of breath: ER  - albuterol (PROVENTIL) (2.5 MG/3ML) 0.083% nebulizer solution 2.5 mg    Return if symptoms worsen or fail to improve.   Orma Flaming, MD [place]   07/01/2018

## 2018-07-01 NOTE — Telephone Encounter (Signed)
Called and spoke to patient to advise that the Albuterol that was given in today's breathing treatment had expired on 06/07/18.  Pt verbalized understanding.  Safety zone portal filed.

## 2018-07-02 ENCOUNTER — Other Ambulatory Visit: Payer: Self-pay | Admitting: Family Medicine

## 2018-07-02 ENCOUNTER — Encounter: Payer: Self-pay | Admitting: Family Medicine

## 2018-07-02 ENCOUNTER — Ambulatory Visit: Payer: BLUE CROSS/BLUE SHIELD | Admitting: Physician Assistant

## 2018-07-02 ENCOUNTER — Encounter: Payer: Self-pay | Admitting: Physician Assistant

## 2018-07-02 VITALS — BP 136/84 | HR 71 | Temp 98.3°F | Ht 68.0 in | Wt 152.0 lb

## 2018-07-02 DIAGNOSIS — J40 Bronchitis, not specified as acute or chronic: Secondary | ICD-10-CM

## 2018-07-02 MED ORDER — ALBUTEROL SULFATE HFA 108 (90 BASE) MCG/ACT IN AERS: 2.0000 | INHALATION_SPRAY | Freq: Four times a day (QID) | RESPIRATORY_TRACT | 0 refills | Status: DC | PRN

## 2018-07-02 MED ORDER — ALBUTEROL SULFATE HFA 108 (90 BASE) MCG/ACT IN AERS: 1.0000 | INHALATION_SPRAY | Freq: Four times a day (QID) | RESPIRATORY_TRACT | 1 refills | Status: DC | PRN

## 2018-07-02 MED ORDER — AMOXICILLIN 500 MG PO CAPS: 1000.0000 mg | ORAL_CAPSULE | Freq: Three times a day (TID) | ORAL | 0 refills | Status: DC

## 2018-07-02 MED ORDER — IPRATROPIUM-ALBUTEROL 0.5-2.5 (3) MG/3ML IN SOLN
3.0000 mL | Freq: Four times a day (QID) | RESPIRATORY_TRACT | Status: DC
  Administered 2018-07-02: 3 mL via RESPIRATORY_TRACT

## 2018-07-02 MED ORDER — FLUTICASONE-SALMETEROL 100-50 MCG/DOSE IN AEPB: 1.0000 | INHALATION_SPRAY | Freq: Two times a day (BID) | RESPIRATORY_TRACT | 3 refills | Status: DC

## 2018-07-02 NOTE — Patient Instructions (Signed)
It was great to see you!  Start albuterol as needed.  Start advair in AM and PM.  Follow-up with provider Morene Rankins, [place], or [place]) in two days for lung re-check, sooner if concerns.  Push fluids and get plenty of rest. Please return if you are not improving as expected, or if you have high fevers (>101.5) or difficulty swallowing or worsening productive cough.  Call clinic with questions.  I hope you start feeling better soon!

## 2018-07-02 NOTE — Progress Notes (Signed)
Beth Frey is a 63 y.o. female here for a follow up of a pre-existing problem.  I acted as a Education administrator for [employer], PA-C Anselmo Pickler, LPN  History of Present Illness:   Chief Complaint  Patient presents with  . Bronchitis    Had an appointment with Dr. Rogers Blocker yesterday   HPI Patient was seen yesterday by Dr. Rogers Blocker and diagnosed with bronchitis. CXR showed "Subtle bilateral patchy opacification which may be due to infection" -- we discussed these results today in the office. She was started on z-pack and oral prednisone. She has been taking the medications as prescribed, however, she states she feels worse today than she did yesterday. She states   Denies: chest pain, lower leg swelling, fevers, hx blood clot  She has significant anxiety and is worried that she has lung CA, states that she has a strong family hx of this. Denies hemoptysis or unusual weight loss.   Past Medical History:  Diagnosis Date  . Anxiety   . Back pain, chronic   . Cancer ([place])    skin  . 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 Osteoarthritis      Social History   Socioeconomic History  . Marital status: Married    Spouse name: Not on file  . Number of children: Not on file  . Years of education: Not on file  . Highest education level: Not on file  Occupational History  . Not on file  Social Needs  . Financial resource strain: Not on file  . Food insecurity:    Worry: Not on file    Inability: Not on file  . Transportation needs:    Medical: Not on file    Non-medical: Not on file  Tobacco Use  . Smoking status: Never Smoker  . Smokeless tobacco: Never Used  Substance and Sexual Activity  . Alcohol use: No    Alcohol/week: 0.0 standard drinks  . Drug use: No  . Sexual activity: Not on file    Comment: married  Lifestyle  . Physical activity:    Days per week: Not on file    Minutes per session: Not on file  . Stress: Not on file  Relationships  . Social connections:    Talks on phone: Not  on file    Gets together: Not on file    Attends religious service: Not on file    Active member of club or organization: Not on file    Attends meetings of clubs or organizations: Not on file    Relationship status: Not on file  . Intimate partner violence:    Fear of current or ex partner: Not on file    Emotionally abused: Not on file    Physically abused: Not on file    Forced sexual activity: Not on file  Other Topics Concern  . Not on file  Social History Narrative  . Not on file    Past Surgical History:  Procedure Laterality Date  . BREAST SURGERY  August 2011   Implants removed  . CHOLECYSTECTOMY    . COLON SURGERY  02/20/2010   Right hemicolectomy-cecal bascule  . ELBOW SURGERY     left elbow  . JOINT REPLACEMENT  2006   Left total knee replacement  . OVARIAN CYST REMOVAL     right  . SKIN CANCER EXCISION     multiple-SCC and BCC  . TUBAL LIGATION    . Uterine ablation    . WRIST SURGERY  2009   right wrist    Family History  Problem Relation Age of Onset  . Cancer Mother        multiple myeloma  . Parkinson's disease Mother   . Cancer Father        stomach cancer  . Cancer Brother        pancreatic cancer  . Stroke Maternal Grandmother   . Cancer Maternal Grandfather        lung cancer  . Cancer Paternal Grandmother        ovarian  . Heart disease Paternal Grandfather   . Cancer Brother        multiple myeloma    Allergies  Allergen Reactions  . Fish Allergy Anaphylaxis  . Sulfa Antibiotics Itching and Swelling    Throat, body swelling  . Doxycycline     Sun   . Trazodone And Nefazodone Other (See Comments)    Serotonin syndrome d/t other medications    Current Medications:   Current Outpatient Medications:  .  atorvastatin (LIPITOR) 20 MG tablet, Take 1 tablet (20 mg total) by mouth daily., Disp: 30 tablet, Rfl: 3 .  azithromycin (ZITHROMAX) 250 MG tablet, 2 pills today and then 1 pill days 2-5. Hold cholesterol drug while on this.,  Disp: 6 tablet, Rfl: 0 .  Cholecalciferol (VITAMIN D3) 5000 units CAPS, Take 1 capsule by mouth daily., Disp: , Rfl:  .  cloNIDine (CATAPRES) 0.2 MG tablet, TAKE 1 TABLET BY MOUTH THREE TIMES A DAY PATIENT MAY TAKE AN EXTRA TABLET IF BLOOD PRESSURE SPIKES, Disp: 270 tablet, Rfl: 1 .  LORazepam (ATIVAN) 0.5 MG tablet, Take 1 tablet (0.5 mg total) by mouth 2 (two) times daily as needed for anxiety., Disp: 60 tablet, Rfl: 2 .  mometasone (NASONEX) 50 MCG/ACT nasal spray, Place 2 sprays into the nose daily as needed (allergies). , Disp: , Rfl:  .  Multiple Vitamin (MULTIVITAMIN WITH MINERALS) TABS tablet, Take 1 tablet by mouth daily., Disp: , Rfl:  .  Omega 3 1000 MG CAPS, Take 1 capsule (1,000 mg total) by mouth daily., Disp: 90 each, Rfl:  .  ondansetron (ZOFRAN) 8 MG tablet, ondansetron HCl 8 mg tablet, Disp: , Rfl:  .  polyethylene glycol (MIRALAX / GLYCOLAX) packet, Take 17 g by mouth daily as needed for mild constipation or moderate constipation. , Disp: , Rfl:  .  potassium chloride (MICRO-K) 10 MEQ CR capsule, Take 1 capsule (10 mEq total) by mouth 3 (three) times daily., Disp: 270 capsule, Rfl: 2 .  predniSONE (DELTASONE) 20 MG tablet, Take 2 tablets (40 mg total) by mouth daily with breakfast., Disp: 10 tablet, Rfl: 0 .  sertraline (ZOLOFT) 50 MG tablet, TAKE 1 TABLET BY MOUTH EVERY DAY, Disp: 90 tablet, Rfl: 1 .  albuterol (PROVENTIL HFA;VENTOLIN HFA) 108 (90 Base) MCG/ACT inhaler, Inhale 2 puffs into the lungs every 6 (six) hours as needed for wheezing or shortness of breath., Disp: 1 Inhaler, Rfl: 0 .  Fluticasone-Salmeterol (ADVAIR) 100-50 MCG/DOSE AEPB, Inhale 1 puff into the lungs 2 (two) times daily., Disp: 1 each, Rfl: 3  Current Facility-Administered Medications:  .  ipratropium-albuterol (DUONEB) 0.5-2.5 (3) MG/3ML nebulizer solution 3 mL, 3 mL, Nebulization, Q6H, Lovis More, [place], [place], 3 mL at 07/02/18 1136   Review of Systems:   Review of Systems    Vitals:   Vitals:    07/02/18 1102  BP: 136/84  Pulse: 71  Temp: 98.3 F (36.8 C)  TempSrc: Oral  SpO2: 97%  Weight: 152 lb (68.9 kg)  Height: '5\' 8"'  (1.727 m)     Body mass index is 23.11 kg/m.  Physical Exam:   Physical Exam Vitals signs and nursing note reviewed.  Constitutional:      General: She is not in acute distress.    Appearance: She is well-developed. She is not ill-appearing or toxic-appearing.  HENT:     Head: Normocephalic and atraumatic.     Right Ear: Ear canal and external ear normal.     Left Ear: Tympanic membrane, ear canal and external ear normal. Tympanic membrane is not erythematous, retracted or bulging.     Ears:     Comments: R TM with dried cerumen covering most of TM -- of TM that is visible, does not appear erythematous    Nose: Mucosal edema, congestion and rhinorrhea present.     Right Sinus: No maxillary sinus tenderness or frontal sinus tenderness.     Left Sinus: No maxillary sinus tenderness or frontal sinus tenderness.     Mouth/Throat:     Pharynx: Uvula midline. No posterior oropharyngeal erythema.  Eyes:     General: Lids are normal.     Conjunctiva/sclera: Conjunctivae normal.  Neck:     Trachea: Trachea normal.  Cardiovascular:     Rate and Rhythm: Normal rate and regular rhythm.     Heart sounds: Normal heart sounds, S1 normal and S2 normal.  Pulmonary:     Effort: Pulmonary effort is normal. No tachypnea, accessory muscle usage, prolonged expiration, respiratory distress or retractions.     Breath sounds: No decreased breath sounds, wheezing, rhonchi or rales.     Comments: Coarse breath sounds throughout bilateral lungs. Wheezing in bilateral upper lobes -- improved but still present after duoneb. Lymphadenopathy:     Cervical: No cervical adenopathy.  Skin:    General: Skin is warm and dry.  Neurological:     Mental Status: She is alert.  Psychiatric:        Speech: Speech normal.        Behavior: Behavior normal. Behavior is cooperative.       Assessment and Plan:   Shar was seen today for bronchitis.  Diagnoses and all orders for this visit:  Bronchitis -     ipratropium-albuterol (DUONEB) 0.5-2.5 (3) MG/3ML nebulizer solution 3 mL  Other orders -     albuterol (PROVENTIL HFA;VENTOLIN HFA) 108 (90 Base) MCG/ACT inhaler; Inhale 2 puffs into the lungs every 6 (six) hours as needed for wheezing or shortness of breath. -     Fluticasone-Salmeterol (ADVAIR) 100-50 MCG/DOSE AEPB; Inhale 1 puff into the lungs 2 (two) times daily.   No red flags on exam.  Continue azithromycin and prednisone. Breathing improved after duoneb. Start albuterol prn and advair scheduled. Discussed taking medications as prescribed. Reviewed return precautions including worsening fever, SOB, worsening cough or other concerns. Push fluids and rest. Follow-up for lung re-check with me, Rogers Blocker or [place] in 2-3 days.  . Reviewed expectations re: course of current medical issues. . Discussed self-management of symptoms. . Outlined signs and symptoms indicating need for more acute intervention. . Patient verbalized understanding and all questions were answered. . See orders for this visit as documented in the electronic medical record. . Patient received an After-Visit Summary.  CMA or LPN served as scribe during this visit. History, Physical, and Plan performed by medical provider. The above documentation has been reviewed and is accurate and complete.   Inda Coke, PA-C

## 2018-07-02 NOTE — Telephone Encounter (Signed)
Called patient made app with Sam her PCP was not in office

## 2018-07-04 ENCOUNTER — Ambulatory Visit: Payer: BLUE CROSS/BLUE SHIELD | Admitting: Physician Assistant

## 2018-07-04 ENCOUNTER — Encounter: Payer: Self-pay | Admitting: Physician Assistant

## 2018-07-04 VITALS — BP 118/76 | HR 69 | Temp 98.2°F | Ht 68.0 in | Wt 151.4 lb

## 2018-07-04 DIAGNOSIS — J40 Bronchitis, not specified as acute or chronic: Secondary | ICD-10-CM | POA: Diagnosis not present

## 2018-07-04 MED ORDER — IPRATROPIUM-ALBUTEROL 0.5-2.5 (3) MG/3ML IN SOLN
3.0000 mL | Freq: Once | RESPIRATORY_TRACT | Status: AC
  Administered 2018-07-04: 3 mL via RESPIRATORY_TRACT

## 2018-07-04 MED ORDER — IPRATROPIUM-ALBUTEROL 0.5-2.5 (3) MG/3ML IN SOLN: 3.0000 mL | Freq: Four times a day (QID) | RESPIRATORY_TRACT | 0 refills | Status: DC | PRN

## 2018-07-04 MED ORDER — PREDNISONE 20 MG PO TABS: 40.0000 mg | ORAL_TABLET | Freq: Every day | ORAL | 0 refills | Status: AC

## 2018-07-04 NOTE — Patient Instructions (Addendum)
It was great to see you!  I would like to extend your prednisone by 3 more days just to continue to help with the wheezing.  Continue albuterol and advair.  Push fluids and get plenty of rest. Please return if you are not improving as expected, or if you have high fevers (>101.5) or difficulty swallowing or worsening productive cough.  I don't think it would be unreasonable to repeat chest xray in 4-6 weeks (early April or so) to make sure you chest xray has cleared up. Please schedule this as a follow-up visit with Steward Ros or Nani Ravens.  Call clinic with questions.  I hope you start feeling better soon!

## 2018-07-04 NOTE — Progress Notes (Signed)
Beth Frey is a 63 y.o. female is here to recheck lungs.  I acted as a Education administrator for [employer], PA-C Anselmo Pickler, LPN  History of Present Illness:   Chief Complaint  Patient presents with  . Follow up for Bronchitis    HPI  Bronchitis Pt here to recheck her lungs. Was seen on 2/25 and given another Nebulizer Tx in office and started on albuterol inhaler and Advair Inhaler BID due to feeling worse. She is still taking z-pack and oral prednisone. She has been taking the medications as prescribed. Pt states she is feeling 60 % better and the Albuterol is helping more than Advair Inhaler. Still having dry non-productive cough but is 50 % better per pt. Pt denies fever, chills, headaches, dizziness, nausea. Still having some SOB here and there.    There are no preventive care reminders to display for this patient.  Past Medical History:  Diagnosis Date  . Anxiety   . Back pain, chronic   . Cancer ([place])    skin  . 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 Osteoarthritis      Social History   Socioeconomic History  . Marital status: Married    Spouse name: Not on file  . Number of children: Not on file  . Years of education: Not on file  . Highest education level: Not on file  Occupational History  . Not on file  Social Needs  . Financial resource strain: Not on file  . Food insecurity:    Worry: Not on file    Inability: Not on file  . Transportation needs:    Medical: Not on file    Non-medical: Not on file  Tobacco Use  . Smoking status: Never Smoker  . Smokeless tobacco: Never Used  Substance and Sexual Activity  . Alcohol use: No    Alcohol/week: 0.0 standard drinks  . Drug use: No  . Sexual activity: Not on file    Comment: married  Lifestyle  . Physical activity:    Days per week: Not on file    Minutes per session: Not on file  . Stress: Not on file  Relationships  . Social connections:    Talks on phone: Not on file    Gets together: Not on file    Attends  religious service: Not on file    Active member of club or organization: Not on file    Attends meetings of clubs or organizations: Not on file    Relationship status: Not on file  . Intimate partner violence:    Fear of current or ex partner: Not on file    Emotionally abused: Not on file    Physically abused: Not on file    Forced sexual activity: Not on file  Other Topics Concern  . Not on file  Social History Narrative  . Not on file    Past Surgical History:  Procedure Laterality Date  . BREAST SURGERY  August 2011   Implants removed  . CHOLECYSTECTOMY    . COLON SURGERY  02/20/2010   Right hemicolectomy-cecal bascule  . ELBOW SURGERY     left elbow  . JOINT REPLACEMENT  2006   Left total knee replacement  . OVARIAN CYST REMOVAL     right  . SKIN CANCER EXCISION     multiple-SCC and BCC  . TUBAL LIGATION    . Uterine ablation    . WRIST SURGERY  2009   right wrist  Family History  Problem Relation Age of Onset  . Cancer Mother        multiple myeloma  . Parkinson's disease Mother   . Cancer Father        stomach cancer  . Cancer Brother        pancreatic cancer  . Stroke Maternal Grandmother   . Cancer Maternal Grandfather        lung cancer  . Cancer Paternal Grandmother        ovarian  . Heart disease Paternal Grandfather   . Cancer Brother        multiple myeloma    PMHx, SurgHx, SocialHx, FamHx, Medications, and Allergies were reviewed in the Visit Navigator and updated as appropriate.   Patient Active Problem List   Diagnosis Date Noted  . Adjustment insomnia 10/31/2016  . Snoring 10/31/2016  . Laxative abuse 07/28/2014  . Septic arthritis of knee, left (HCC) 10/21/2013  . RLQ abdominal pain 11/13/2012  . Unspecified constipation 11/13/2012  . Carcinoma in situ of skin of eyelid including canthus 06/26/2012  . Hyperlipidemia 11/23/2011  . Vitamin D deficiency 11/23/2011  . History of abnormal cervical Pap smear 11/23/2011  . Anxiety  disorder 11/23/2011  . Thyroid nodule 11/23/2011  . Skin cancer 11/23/2011  . Chronic hypokalemia 11/23/2011  . Incisional hernia 06/14/2011  . Personal history of other malignant neoplasm of skin 02/02/2011    Social History   Tobacco Use  . Smoking status: Never Smoker  . Smokeless tobacco: Never Used  Substance Use Topics  . Alcohol use: No    Alcohol/week: 0.0 standard drinks  . Drug use: No    Current Medications and Allergies:    Current Outpatient Medications:  .  albuterol (PROAIR HFA) 108 (90 Base) MCG/ACT inhaler, Inhale 1-2 puffs into the lungs every 6 (six) hours as needed for wheezing or shortness of breath., Disp: 1 Inhaler, Rfl: 1 .  albuterol (PROVENTIL HFA;VENTOLIN HFA) 108 (90 Base) MCG/ACT inhaler, Inhale 2 puffs into the lungs every 6 (six) hours as needed for wheezing or shortness of breath., Disp: 1 Inhaler, Rfl: 0 .  amoxicillin (AMOXIL) 500 MG capsule, Take 2 capsules (1,000 mg total) by mouth 3 (three) times daily., Disp: 42 capsule, Rfl: 0 .  azithromycin (ZITHROMAX) 250 MG tablet, 2 pills today and then 1 pill days 2-5. Hold cholesterol drug while on this., Disp: 6 tablet, Rfl: 0 .  Cholecalciferol (VITAMIN D3) 5000 units CAPS, Take 1 capsule by mouth daily., Disp: , Rfl:  .  cloNIDine (CATAPRES) 0.2 MG tablet, TAKE 1 TABLET BY MOUTH THREE TIMES A DAY PATIENT MAY TAKE AN EXTRA TABLET IF BLOOD PRESSURE SPIKES, Disp: 270 tablet, Rfl: 1 .  Fluticasone-Salmeterol (ADVAIR) 100-50 MCG/DOSE AEPB, Inhale 1 puff into the lungs 2 (two) times daily., Disp: 1 each, Rfl: 3 .  LORazepam (ATIVAN) 0.5 MG tablet, Take 1 tablet (0.5 mg total) by mouth 2 (two) times daily as needed for anxiety., Disp: 60 tablet, Rfl: 2 .  mometasone (NASONEX) 50 MCG/ACT nasal spray, Place 2 sprays into the nose daily as needed (allergies). , Disp: , Rfl:  .  Multiple Vitamin (MULTIVITAMIN WITH MINERALS) TABS tablet, Take 1 tablet by mouth daily., Disp: , Rfl:  .  Omega 3 1000 MG CAPS, Take 1  capsule (1,000 mg total) by mouth daily., Disp: 90 each, Rfl:  .  ondansetron (ZOFRAN) 8 MG tablet, ondansetron HCl 8 mg tablet, Disp: , Rfl:  .  polyethylene glycol (MIRALAX / GLYCOLAX) packet,   Take 17 g by mouth daily as needed for mild constipation or moderate constipation. , Disp: , Rfl:  .  potassium chloride (MICRO-K) 10 MEQ CR capsule, Take 1 capsule (10 mEq total) by mouth 3 (three) times daily., Disp: 270 capsule, Rfl: 2 .  predniSONE (DELTASONE) 20 MG tablet, Take 2 tablets (40 mg total) by mouth daily with breakfast for 3 days., Disp: 6 tablet, Rfl: 0 .  sertraline (ZOLOFT) 50 MG tablet, TAKE 1 TABLET BY MOUTH EVERY DAY, Disp: 90 tablet, Rfl: 1 .  ipratropium-albuterol (DUONEB) 0.5-2.5 (3) MG/3ML SOLN, Take 3 mLs by nebulization every 6 (six) hours as needed., Disp: 360 mL, Rfl: 0  Current Facility-Administered Medications:  .  ipratropium-albuterol (DUONEB) 0.5-2.5 (3) MG/3ML nebulizer solution 3 mL, 3 mL, Nebulization, Q6H, Worley, Samantha, PA, 3 mL at 07/02/18 1136   Allergies  Allergen Reactions  . Fish Allergy Anaphylaxis  . Sulfa Antibiotics Itching and Swelling    Throat, body swelling  . Doxycycline     Sun   . Trazodone And Nefazodone Other (See Comments)    Serotonin syndrome d/t other medications    Review of Systems   Review of Systems  Constitutional: Negative for chills, fever, malaise/fatigue and weight loss.  Respiratory: Positive for cough, shortness of breath and wheezing.   Cardiovascular: Negative for chest pain, orthopnea, claudication and leg swelling.  Gastrointestinal: Negative for heartburn, nausea and vomiting.  Neurological: Negative for dizziness, tingling and headaches.      Vitals:   Vitals:   07/04/18 0852  BP: 118/76  Pulse: 69  Temp: 98.2 F (36.8 C)  TempSrc: Oral  SpO2: 97%  Weight: 151 lb 6.1 oz (68.7 kg)  Height: 5' 8" (1.727 m)     Body mass index is 23.02 kg/m.   Physical Exam:    Physical Exam Vitals signs and  nursing note reviewed.  Constitutional:      General: She is not in acute distress.    Appearance: She is well-developed. She is not ill-appearing or toxic-appearing.  HENT:     Head: Normocephalic and atraumatic.     Right Ear: Tympanic membrane, ear canal and external ear normal. Tympanic membrane is not erythematous, retracted or bulging.     Left Ear: Tympanic membrane, ear canal and external ear normal. Tympanic membrane is not erythematous, retracted or bulging.     Nose: Nose normal.     Right Sinus: No maxillary sinus tenderness or frontal sinus tenderness.     Left Sinus: No maxillary sinus tenderness or frontal sinus tenderness.     Mouth/Throat:     Lips: Pink.     Mouth: Mucous membranes are moist.     Pharynx: Uvula midline. Posterior oropharyngeal erythema present.  Eyes:     General: Lids are normal.     Conjunctiva/sclera: Conjunctivae normal.  Neck:     Trachea: Trachea normal.  Cardiovascular:     Rate and Rhythm: Normal rate and regular rhythm.     Heart sounds: Normal heart sounds, S1 normal and S2 normal.  Pulmonary:     Effort: Pulmonary effort is normal.     Breath sounds: Examination of the right-upper field reveals wheezing. Examination of the left-upper field reveals wheezing. Decreased breath sounds and wheezing present. No rhonchi or rales.  Lymphadenopathy:     Cervical: No cervical adenopathy.  Skin:    General: Skin is warm and dry.  Neurological:     Mental Status: She is alert.  Psychiatric:          Speech: Speech normal.        Behavior: Behavior normal. Behavior is cooperative.      Assessment and Plan:    Arla was seen today for follow up for bronchitis.  Diagnoses and all orders for this visit:  Bronchitis -     DME Nebulizer machine -     ipratropium-albuterol (DUONEB) 0.5-2.5 (3) MG/3ML nebulizer solution 3 mL  Other orders -     predniSONE (DELTASONE) 20 MG tablet; Take 2 tablets (40 mg total) by mouth daily with breakfast for  3 days. -     ipratropium-albuterol (DUONEB) 0.5-2.5 (3) MG/3ML SOLN; Take 3 mLs by nebulization every 6 (six) hours as needed.   She is improving well on my exam. Received nebulizer treatment and wheezing improved on exam. I have provided her with DME script for nebulizer machine. I have also sent in an extension of her prednisone for 3 days. Continue antibiotic regimen. I also recommended repeat CXR in 4-6 weeks to ensure resolution.  . Reviewed expectations re: course of current medical issues. . Discussed self-management of symptoms. . Outlined signs and symptoms indicating need for more acute intervention. . Patient verbalized understanding and all questions were answered. . See orders for this visit as documented in the electronic medical record. . Patient received an After Visit Summary.  CMA or LPN served as scribe during this visit. History, Physical, and Plan performed by medical provider. The above documentation has been reviewed and is accurate and complete.   Inda Coke, PA-C [facility] 07/04/2018  Follow-up: No follow-ups on file.

## 2018-07-23 DIAGNOSIS — M1712 Unilateral primary osteoarthritis, left knee: Secondary | ICD-10-CM | POA: Diagnosis not present

## 2018-07-23 DIAGNOSIS — M25562 Pain in left knee: Secondary | ICD-10-CM | POA: Diagnosis not present

## 2018-07-26 ENCOUNTER — Other Ambulatory Visit: Payer: Self-pay | Admitting: Physician Assistant

## 2018-07-29 ENCOUNTER — Telehealth: Payer: Self-pay | Admitting: *Deleted

## 2018-07-29 NOTE — Telephone Encounter (Signed)
Left message on voicemail to call office.  

## 2018-07-30 DIAGNOSIS — M25521 Pain in right elbow: Secondary | ICD-10-CM | POA: Diagnosis not present

## 2018-07-30 NOTE — Telephone Encounter (Signed)
Left message on voicemail to call office. Calling to see how pt is feeling and would like to move appt out 2-4 weeks per [facility] due to virus.

## 2018-07-30 NOTE — Telephone Encounter (Signed)
Pt called back, asked her how she was feeling and if fever is gone? Pt said no fever and cough is better she is not 100% but was suppose to have Chest x-ray repeated.Explained to pt that [facility] would like to push your follow up out another 2-4 weeks due to Coronavirus and we are trying not to bring pt's into the office. Also if we do Chest x-ray we have to shut it down. Pt verbalized understanding and said she will have to call back ready to go into a meeting with her mother. Told pt okay please call back and schedule 2-4 weeks out. Pt verbalized understanding.

## 2018-08-01 ENCOUNTER — Ambulatory Visit: Payer: BLUE CROSS/BLUE SHIELD | Admitting: Physician Assistant

## 2018-08-07 DIAGNOSIS — M1712 Unilateral primary osteoarthritis, left knee: Secondary | ICD-10-CM | POA: Diagnosis not present

## 2018-08-14 DIAGNOSIS — M1712 Unilateral primary osteoarthritis, left knee: Secondary | ICD-10-CM | POA: Diagnosis not present

## 2018-08-21 DIAGNOSIS — M1712 Unilateral primary osteoarthritis, left knee: Secondary | ICD-10-CM | POA: Diagnosis not present

## 2018-09-09 DIAGNOSIS — M25572 Pain in left ankle and joints of left foot: Secondary | ICD-10-CM | POA: Diagnosis not present

## 2018-09-09 DIAGNOSIS — M25571 Pain in right ankle and joints of right foot: Secondary | ICD-10-CM | POA: Diagnosis not present

## 2018-09-12 ENCOUNTER — Other Ambulatory Visit: Payer: Self-pay | Admitting: Family Medicine

## 2018-09-13 ENCOUNTER — Encounter: Payer: Self-pay | Admitting: Family Medicine

## 2018-09-16 ENCOUNTER — Telehealth: Payer: Self-pay | Admitting: Family Medicine

## 2018-09-16 MED ORDER — SERTRALINE HCL 50 MG PO TABS: 50.0000 mg | ORAL_TABLET | Freq: Every day | ORAL | 0 refills | Status: DC

## 2018-09-16 NOTE — Telephone Encounter (Signed)
OK w me.  

## 2018-09-16 NOTE — Telephone Encounter (Signed)
Patient would like to transfer to Dr. Rogers Blocker since the office is more convenient to where she lives. Please advise.

## 2018-09-16 NOTE — Telephone Encounter (Signed)
Okay with me 

## 2018-09-18 ENCOUNTER — Telehealth (INDEPENDENT_AMBULATORY_CARE_PROVIDER_SITE_OTHER): Payer: BLUE CROSS/BLUE SHIELD | Admitting: Family Medicine

## 2018-09-18 ENCOUNTER — Other Ambulatory Visit: Payer: Self-pay

## 2018-09-18 ENCOUNTER — Encounter: Payer: Self-pay | Admitting: Family Medicine

## 2018-09-18 VITALS — BP 118/74 | Temp 97.4°F | Ht 68.0 in | Wt 150.0 lb

## 2018-09-18 DIAGNOSIS — J069 Acute upper respiratory infection, unspecified: Secondary | ICD-10-CM | POA: Diagnosis not present

## 2018-09-18 DIAGNOSIS — Z8679 Personal history of other diseases of the circulatory system: Secondary | ICD-10-CM

## 2018-09-18 DIAGNOSIS — F419 Anxiety disorder, unspecified: Secondary | ICD-10-CM

## 2018-09-18 MED ORDER — SERTRALINE HCL 50 MG PO TABS: 50.0000 mg | ORAL_TABLET | Freq: Every day | ORAL | 3 refills | Status: DC

## 2018-09-18 NOTE — Progress Notes (Signed)
Patient: Beth Frey MRN: [id number] DOB: November 01, 1955 PCP: Orma Flaming, MD     I connected with [name] on 09/18/18 at 8:42am by a video enabled telemedicine application and verified that I am speaking with the correct person using two identifiers.  Location patient: Home Location provider: [facility], Office Persons participating in this virtual visit: [name] and Dr. Rogers Blocker   I discussed the limitations of evaluation and management by telemedicine and the availability of in person appointments. The patient expressed understanding and agreed to proceed.   Subjective:  Chief Complaint  Patient presents with  . Anxiety    HPI: The patient is a 63 y.o. female who presents today for anxiety follow up and transfer of care. She is overall healthy and is currently on zoloft and clonidine. She states the zoloft was started after her HRT was abruptly stopped and she went into a tailspin. She sometimes thinks about weaning this, but feels like it keeps her even. She has no complaints, side effects or issues. Takes as prescribed. Her PCP just filled for 30 days and she is wanting to make sure she will have refills on this. No longer on ativan as she has weaned off of this. Has had drug contract and UDS with her pcp in past year for this. Weaned from ativan a few months ago. Feels like she is doing fine without it. Walking more. Not in counseling. Denies any si/hi/ah/vh,   Clonidine: Unsure why on this? HTN? She states she has been on for 8 years, but on no other HTN medication. No allergy to HTN medications. States she has taken .87m every AM for 8 years. Discussed this is not even recommended or last line for HTN. She doesn't think she has been on any other blood pressure medication.  Review of Systems  Constitutional: Negative for fatigue and unexpected weight change.  Eyes: Negative for visual disturbance.  Respiratory: Negative for shortness of breath.   Cardiovascular: Negative for  chest pain.  Gastrointestinal: Negative for abdominal pain and nausea.  Neurological: Negative for dizziness and headaches.  Psychiatric/Behavioral: Negative for sleep disturbance. The patient is nervous/anxious.     Allergies Patient is allergic to fish allergy; sulfa antibiotics; doxycycline; and trazodone and nefazodone.  Past Medical History Patient  has a past medical history of Anxiety, Back pain, chronic, [place], [place]al [place], and Osteoarthritis.  Surgical History Patient  has a past surgical history that includes Wrist surgery (2009); Elbow surgery; Ovarian cyst removal; Skin cancer excision; Uterine ablation; Tubal ligation; Cholecystectomy; Colon surgery (02/20/2010); Joint replacement (2006); and Breast surgery (August 2011).  Family History Pateint's family history includes Cancer in her brother, brother, father, maternal grandfather, mother, and paternal grandmother; Heart disease in her paternal grandfather; Parkinson's disease in her mother; Stroke in her maternal grandmother.  Social History Patient  reports that she has never smoked. She has never used smokeless tobacco. She reports that she does not drink alcohol or use drugs.    Objective: Vitals:   09/18/18 0826  BP: 118/74  Temp: (!) 97.4 F (36.3 C)  TempSrc: Oral  Weight: 150 lb (68 kg)  Height: '5\' 8"'  (1.727 m)    Body mass index is 22.81 kg/m.  Physical Exam Vitals signs reviewed.  Constitutional:      Appearance: Normal appearance.  Eyes:     Extraocular Movements: Extraocular movements intact.  Pulmonary:     Effort: Pulmonary effort is normal.  Neurological:     General: No focal deficit  present.     Mental Status: She is alert and oriented to person, place, and time.  Psychiatric:        Mood and Affect: Mood normal.        Behavior: Behavior normal.    GAD 7 : Generalized Anxiety Score 09/18/2018  Nervous, Anxious, on Edge 0  Control/stop worrying 0  Worry too much -  different things 0  Trouble relaxing 0  Restless 0  Easily annoyed or irritable 0  Afraid - awful might happen 0  Total GAD 7 Score 0  Anxiety Difficulty Not difficult at all        Assessment/plan: 1. Anxiety disorder, unspecified type Extremely well controlled on her zoloft. GAD7 score of zero. I have met her once and feel like she needs to continue this as she is an anxious person. Would not wean at this time, especially with everything going on. Refilled her zoloft and will see her back for annual in December. Can discuss weaning at that time.   2. H/O: HTN (hypertension) Would like to take her off of the clonidine and see how she does. Im going on maternity leave, so suggested we not do anything until I get back. She could do 1/2 tab of the clonidine in the AM and monitor BP with this decreased dose. Discussed has to do slow wean off of this medication due to rebound HTN. Im fine if she does wean down to the 1/2 tAB q am. See her back when I come back from maternity leave, but come in sooner to be seen by another provider if any issues.   -requesting ab testing for covid. Had what appeared to be a bad bronchitis back in February after returning from [place]. I do feel like this is warranted. Future ordered for her.   -due for cscope. Advised she call GI and see about setting this up.    Return in about 7 months (around 04/20/2019).   Orma Flaming, MD [place]  09/18/2018

## 2018-09-18 NOTE — Addendum Note (Signed)
Addended by: Francis Dowse T on: 09/18/2018 09:46 AM   Modules accepted: Orders

## 2018-09-19 LAB — SARS-COV-2 ANTIBODY, IGM: SARS-CoV-2 Antibody, IgM: NEGATIVE

## 2018-09-23 DIAGNOSIS — C44719 Basal cell carcinoma of skin of left lower limb, including hip: Secondary | ICD-10-CM | POA: Diagnosis not present

## 2018-09-23 DIAGNOSIS — D489 Neoplasm of uncertain behavior, unspecified: Secondary | ICD-10-CM | POA: Diagnosis not present

## 2018-09-23 DIAGNOSIS — L989 Disorder of the skin and subcutaneous tissue, unspecified: Secondary | ICD-10-CM | POA: Diagnosis not present

## 2018-09-23 DIAGNOSIS — L570 Actinic keratosis: Secondary | ICD-10-CM | POA: Diagnosis not present

## 2018-09-25 DIAGNOSIS — M2011 Hallux valgus (acquired), right foot: Secondary | ICD-10-CM | POA: Diagnosis not present

## 2018-09-27 DIAGNOSIS — M2011 Hallux valgus (acquired), right foot: Secondary | ICD-10-CM | POA: Diagnosis not present

## 2018-09-27 DIAGNOSIS — M79671 Pain in right foot: Secondary | ICD-10-CM | POA: Diagnosis not present

## 2018-10-07 ENCOUNTER — Other Ambulatory Visit: Payer: Self-pay

## 2018-10-07 ENCOUNTER — Ambulatory Visit (INDEPENDENT_AMBULATORY_CARE_PROVIDER_SITE_OTHER): Payer: BLUE CROSS/BLUE SHIELD

## 2018-10-07 ENCOUNTER — Encounter: Payer: Self-pay | Admitting: Podiatry

## 2018-10-07 ENCOUNTER — Ambulatory Visit: Payer: BLUE CROSS/BLUE SHIELD | Admitting: Podiatry

## 2018-10-07 VITALS — Temp 97.9°F

## 2018-10-07 DIAGNOSIS — M779 Enthesopathy, unspecified: Secondary | ICD-10-CM

## 2018-10-07 DIAGNOSIS — M2011 Hallux valgus (acquired), right foot: Secondary | ICD-10-CM

## 2018-10-07 DIAGNOSIS — M2041 Other hammer toe(s) (acquired), right foot: Secondary | ICD-10-CM | POA: Diagnosis not present

## 2018-10-07 DIAGNOSIS — M2012 Hallux valgus (acquired), left foot: Secondary | ICD-10-CM | POA: Diagnosis not present

## 2018-10-08 ENCOUNTER — Other Ambulatory Visit: Payer: Self-pay | Admitting: Podiatry

## 2018-10-08 DIAGNOSIS — M2012 Hallux valgus (acquired), left foot: Secondary | ICD-10-CM

## 2018-10-08 DIAGNOSIS — M2011 Hallux valgus (acquired), right foot: Secondary | ICD-10-CM

## 2018-10-14 NOTE — Progress Notes (Signed)
Subjective:   Patient ID: Beth Frey, female   DOB: 62 y.o.   MRN: [id number]   HPI  63 year old female presents the office today for concerns of a painful bunion on her right foot.  This is been ongoing about 1 year.  She does use toe separators.  Occasionally throughout.  She states it hurts mostly with wearing shoes.  She states that she is also started to get some pain to her left ankle worse than her right.  This is also been ongoing for about 1 year.  She states that she hurts when she first gets up.  No recent injury or falls.  No swelling.  No radiating pain.  No other concerns.   Review of Systems  All other systems reviewed and are negative.  Past Medical History:  Diagnosis Date  . Anxiety   . Back pain, chronic   . Cancer ([place])    skin  . 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 Osteoarthritis     Past Surgical History:  Procedure Laterality Date  . BREAST SURGERY  August 2011   Implants removed  . CHOLECYSTECTOMY    . COLON SURGERY  02/20/2010   Right hemicolectomy-cecal bascule  . ELBOW SURGERY     left elbow  . JOINT REPLACEMENT  2006   Left total knee replacement  . OVARIAN CYST REMOVAL     right  . SKIN CANCER EXCISION     multiple-SCC and BCC  . TUBAL LIGATION    . Uterine ablation    . WRIST SURGERY  2009   right wrist     Current Outpatient Medications:  .  albuterol (PROVENTIL HFA;VENTOLIN HFA) 108 (90 Base) MCG/ACT inhaler, Inhale 2 puffs into the lungs every 6 (six) hours as needed for wheezing or shortness of breath., Disp: 1 Inhaler, Rfl: 0 .  cloNIDine (CATAPRES) 0.2 MG tablet, TAKE 1 TABLET BY MOUTH THREE TIMES A DAY PATIENT MAY TAKE AN EXTRA TABLET IF BLOOD PRESSURE SPIKES, Disp: 270 tablet, Rfl: 1 .  Fluticasone-Salmeterol (ADVAIR) 100-50 MCG/DOSE AEPB, Inhale 1 puff into the lungs 2 (two) times daily., Disp: 1 each, Rfl: 3 .  ipratropium-albuterol (DUONEB) 0.5-2.5 (3) MG/3ML SOLN, INHALE 1 VIAL BY NEBULIZER EVERY 6 HOURS AS NEEDED, Disp: 360 mL, Rfl: 0 .   LORazepam (ATIVAN) 0.5 MG tablet, Take 1 tablet (0.5 mg total) by mouth 2 (two) times daily as needed for anxiety. (Patient not taking: Reported on 09/18/2018), Disp: 60 tablet, Rfl: 2 .  mometasone (NASONEX) 50 MCG/ACT nasal spray, Place 2 sprays into the nose daily as needed (allergies). , Disp: , Rfl:  .  Multiple Vitamin (MULTIVITAMIN WITH MINERALS) TABS tablet, Take 1 tablet by mouth daily., Disp: , Rfl:  .  mupirocin ointment (BACTROBAN) 2 %, USE 3 TIMES A DAY TO BIOPSY SITES UNTIL HEALED, Disp: , Rfl:  .  Omega 3 1000 MG CAPS, Take 1 capsule (1,000 mg total) by mouth daily., Disp: 90 each, Rfl:  .  ondansetron (ZOFRAN) 8 MG tablet, ondansetron HCl 8 mg tablet, Disp: , Rfl:  .  polyethylene glycol (MIRALAX / GLYCOLAX) packet, Take 17 g by mouth daily as needed for mild constipation or moderate constipation. , Disp: , Rfl:  .  potassium chloride (MICRO-K) 10 MEQ CR capsule, Take 1 capsule (10 mEq total) by mouth 3 (three) times daily., Disp: 270 capsule, Rfl: 2 .  sertraline (ZOLOFT) 50 MG tablet, Take 1 tablet (50 mg total) by mouth daily., Disp: 90 tablet, Rfl: 3  Allergies  Allergen Reactions  . Fish Allergy Anaphylaxis  . Sulfa Antibiotics Itching and Swelling    Throat, body swelling  . Doxycycline     Sun   . Trazodone And Nefazodone Other (See Comments)    Serotonin syndrome d/t other medications         Objective:  Physical Exam  General: AAO x3, NAD  Dermatological: Skin is warm, dry and supple bilateral. Nails x 10 are well manicured; remaining integument appears unremarkable at this time. There are no open sores, no preulcerative lesions, no rash or signs of infection present.  Vascular: Dorsalis Pedis artery and Posterior Tibial artery pedal pulses are 2/4 bilateral with immedate capillary fill time. There is no pain with calf compression, swelling, warmth, erythema.   Neruologic: Grossly intact via light touch bilateral.  Protective threshold with Semmes Wienstein  monofilament intact to all pedal sites bilateral.  Negative Tinel sign  Musculoskeletal: Moderate bunion deformities present bilaterally.  There is tenderness palpation along the medial aspect of the bunion subjectively with the right side worse than left.  There is no crepitation MPJ range of motion.  No other areas of tenderness of her feet.  Hammertoe deformity of the right second toe.  There is medial deviation of the lesser digits.  Mild decrease in medial arch upon weightbearing.  Mild discomfort in the course of the flexor tendons on the ankle bilaterally left side worse than right there is no edema, erythema.  Tendons appear to be intact.  Achilles tendon intact.  Muscular strength 5/5 in all groups tested bilateral.  Gait: Unassisted, Nonantalgic.       Assessment:   Symptomatic bunion deformity right side; ankle tendinitis    Plan:  -Treatment options discussed including all alternatives, risks, and complications -Etiology of symptoms were discussed -X-rays were obtained and reviewed with the patient.  Moderate bunion deformities present.  Calcaneal spurs present.  No evidence of acute fracture. -We long discussion regards treatment options both conservative as well as surgical options were discussed.  After long discussion she wants think about surgery and consider this in the future but for now continue with conservative care.  Discussed shoe modifications as well as offloading.  Discussed orthotics.  She is very nervous today's appointment due to my come back to see Liliane Channel to get measured for orthotics.  Trula Slade DPM

## 2018-10-15 ENCOUNTER — Ambulatory Visit (INDEPENDENT_AMBULATORY_CARE_PROVIDER_SITE_OTHER): Payer: Self-pay | Admitting: Orthotics

## 2018-10-15 ENCOUNTER — Other Ambulatory Visit: Payer: Self-pay

## 2018-10-15 DIAGNOSIS — M7751 Other enthesopathy of right foot: Secondary | ICD-10-CM

## 2018-10-15 DIAGNOSIS — M779 Enthesopathy, unspecified: Secondary | ICD-10-CM

## 2018-10-15 DIAGNOSIS — M2041 Other hammer toe(s) (acquired), right foot: Secondary | ICD-10-CM

## 2018-10-15 DIAGNOSIS — M7752 Other enthesopathy of left foot: Secondary | ICD-10-CM

## 2018-10-15 NOTE — Progress Notes (Signed)
Patient came into today to be cast for [employer]. Upon recommendation of Dr. Jacqualyn Posey Patient presents with bunion pain, peroneal pain, arch pain  Goals are offload 1st mpj, long arch support and rf stability. Plan vendor Riccy, w/ hug arch, 1st met offload, 3* valgus wedging and 1/8" RF lift

## 2018-10-18 ENCOUNTER — Other Ambulatory Visit: Payer: Self-pay | Admitting: Physician Assistant

## 2018-10-22 ENCOUNTER — Encounter: Payer: Self-pay | Admitting: Family Medicine

## 2018-10-29 ENCOUNTER — Other Ambulatory Visit: Payer: Self-pay | Admitting: Family Medicine

## 2018-10-29 DIAGNOSIS — F411 Generalized anxiety disorder: Secondary | ICD-10-CM

## 2018-10-29 DIAGNOSIS — I10 Essential (primary) hypertension: Secondary | ICD-10-CM

## 2018-10-30 ENCOUNTER — Encounter: Payer: Self-pay | Admitting: Family Medicine

## 2018-11-04 ENCOUNTER — Emergency Department (HOSPITAL_BASED_OUTPATIENT_CLINIC_OR_DEPARTMENT_OTHER)
Admission: EM | Admit: 2018-11-04 | Discharge: 2018-11-04 | Disposition: A | Discharge status: Home / Self Care | Payer: BC Managed Care – PPO | Attending: Emergency Medicine | Admitting: Emergency Medicine

## 2018-11-04 ENCOUNTER — Encounter (HOSPITAL_BASED_OUTPATIENT_CLINIC_OR_DEPARTMENT_OTHER): Payer: Self-pay | Admitting: *Deleted

## 2018-11-04 ENCOUNTER — Other Ambulatory Visit: Payer: Self-pay

## 2018-11-04 DIAGNOSIS — Z96652 Presence of left artificial knee joint: Secondary | ICD-10-CM | POA: Insufficient documentation

## 2018-11-04 DIAGNOSIS — Z85828 Personal history of other malignant neoplasm of skin: Secondary | ICD-10-CM | POA: Insufficient documentation

## 2018-11-04 DIAGNOSIS — Z20822 Contact with and (suspected) exposure to covid-19: Secondary | ICD-10-CM

## 2018-11-04 DIAGNOSIS — Z008 Encounter for other general examination: Secondary | ICD-10-CM | POA: Diagnosis not present

## 2018-11-04 DIAGNOSIS — Z20828 Contact with and (suspected) exposure to other viral communicable diseases: Secondary | ICD-10-CM | POA: Diagnosis not present

## 2018-11-04 DIAGNOSIS — Z79899 Other long term (current) drug therapy: Secondary | ICD-10-CM | POA: Diagnosis not present

## 2018-11-04 LAB — SARS CORONAVIRUS 2 AG (30 MIN TAT): SARS Coronavirus 2 Ag: NEGATIVE

## 2018-11-04 NOTE — ED Triage Notes (Signed)
Exposed to pos covid 19 pt this past saturday  Denies any complaints

## 2018-11-04 NOTE — ED Provider Notes (Signed)
[facility] EMERGENCY DEPARTMENT Provider Note   CSN: [id number] Arrival date & time: 11/04/18  1147    History   Chief Complaint No chief complaint on file.   HPI Beth Frey is a 63 y.o. female.     Pt presents to the ED today for a covid swab.  Pt is married to an ED physician who has been exposed to Covid.  She denies any sob, cough, or fever.       Past Medical History:  Diagnosis Date  . Anxiety   . Back pain, chronic   . Cancer ([place])    skin  . 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 Osteoarthritis     Patient Active Problem List   Diagnosis Date Noted  . History of prosthetic unicompartmental arthroplasty of left knee 07/23/2017  . Pain in left knee 07/23/2017  . Adjustment insomnia 10/31/2016  . Snoring 10/31/2016  . Laxative abuse 07/28/2014  . Septic arthritis of knee, left ([place]) 10/21/2013  . RLQ abdominal pain 11/13/2012  . Unspecified constipation 11/13/2012  . Carcinoma in situ of skin of eyelid including canthus 06/26/2012  . Hyperlipidemia 11/23/2011  . Vitamin D deficiency 11/23/2011  . History of abnormal cervical Pap smear 11/23/2011  . Anxiety disorder 11/23/2011  . Thyroid nodule 11/23/2011  . Skin cancer 11/23/2011  . Chronic hypokalemia 11/23/2011  . Incisional hernia 06/14/2011  . Personal history of other malignant neoplasm of skin 02/02/2011    Past Surgical History:  Procedure Laterality Date  . BREAST SURGERY  August 2011   Implants removed  . CHOLECYSTECTOMY    . COLON SURGERY  02/20/2010   Right hemicolectomy-cecal bascule  . ELBOW SURGERY     left elbow  . JOINT REPLACEMENT  2006   Left total knee replacement  . OVARIAN CYST REMOVAL     right  . SKIN CANCER EXCISION     multiple-SCC and BCC  . TUBAL LIGATION    . Uterine ablation    . WRIST SURGERY  2009   right wrist     OB History    Gravida  0   Para  0   Term  0   Preterm  0   AB  0   Living  0     SAB  0   TAB  0   Ectopic  0   Multiple  0    Live Births  0            Home Medications    Prior to Admission medications   Medication Sig Start Date End Date Taking? Authorizing Provider  ADVAIR DISKUS 100-50 MCG/DOSE AEPB TAKE 1 PUFF BY MOUTH TWICE A DAY 10/18/18   Inda Coke, PA  albuterol (PROVENTIL HFA;VENTOLIN HFA) 108 (90 Base) MCG/ACT inhaler Inhale 2 puffs into the lungs every 6 (six) hours as needed for wheezing or shortness of breath. 07/02/18   Inda Coke, PA  cloNIDine (CATAPRES) 0.2 MG tablet TAKE 1 TABLET BY MOUTH THREE TIMES A DAY PATIENT MAY TAKE AN EXTRA TABLET IF BLOOD PRESSURE SPIKES 10/29/18   Nani Ravens, Crosby Oyster, DO  ipratropium-albuterol (DUONEB) 0.5-2.5 (3) MG/3ML SOLN INHALE 1 VIAL BY NEBULIZER EVERY 6 HOURS AS NEEDED 07/26/18   Inda Coke, PA  LORazepam (ATIVAN) 0.5 MG tablet Take 1 tablet (0.5 mg total) by mouth 2 (two) times daily as needed for anxiety. Patient not taking: Reported on 09/18/2018 11/14/17   Shelda Pal, DO  mometasone (NASONEX) 50 MCG/ACT nasal spray Place  2 sprays into the nose daily as needed (allergies).     [provider]  Multiple Vitamin (MULTIVITAMIN WITH MINERALS) TABS tablet Take 1 tablet by mouth daily.    [provider]  mupirocin ointment (BACTROBAN) 2 % USE 3 TIMES A DAY TO BIOPSY SITES UNTIL HEALED 09/26/18   [provider]  Omega 3 1000 MG CAPS Take 1 capsule (1,000 mg total) by mouth daily. 07/20/16   Forrest Moron, MD  ondansetron (ZOFRAN) 8 MG tablet ondansetron HCl 8 mg tablet    [provider]  polyethylene glycol (MIRALAX / GLYCOLAX) packet Take 17 g by mouth daily as needed for mild constipation or moderate constipation.     [provider]  potassium chloride (MICRO-K) 10 MEQ CR capsule Take 1 capsule (10 mEq total) by mouth 3 (three) times daily. 04/18/18   Shelda Pal, DO  sertraline (ZOLOFT) 50 MG tablet Take 1 tablet (50 mg total) by mouth daily. 09/18/18   Orma Flaming, MD     Family History Family History  Problem Relation Age of Onset  . Cancer Mother        multiple myeloma  . Parkinson's disease Mother   . Cancer Father        stomach cancer  . Cancer Brother        pancreatic cancer  . Stroke Maternal Grandmother   . Cancer Maternal Grandfather        lung cancer  . Cancer Paternal Grandmother        ovarian  . Heart disease Paternal Grandfather   . Cancer Brother        multiple myeloma    Social History Social History   Tobacco Use  . Smoking status: Never Smoker  . Smokeless tobacco: Never Used  Substance Use Topics  . Alcohol use: No    Alcohol/week: 0.0 standard drinks  . Drug use: No     Allergies   Fish allergy, Sulfa antibiotics, Doxycycline, and Trazodone and nefazodone   Review of Systems Review of Systems  All other systems reviewed and are negative.    Physical Exam Updated Vital Signs BP (!) 139/93 (BP Location: Right Arm)   Pulse 75   Temp 98.1 F (36.7 C) (Oral)   Resp 14   Ht '5\' 8"'  (1.727 m)   Wt 65.8 kg   LMP  (LMP Unknown)   SpO2 99%   BMI 22.05 kg/m   Physical Exam Vitals signs and nursing note reviewed.  Constitutional:      Appearance: Normal appearance.  HENT:     Head: Normocephalic and atraumatic.     Nose: Nose normal.     Mouth/Throat:     Mouth: Mucous membranes are moist.  Cardiovascular:     Rate and Rhythm: Normal rate and regular rhythm.     Pulses: Normal pulses.     Heart sounds: Normal heart sounds.  Pulmonary:     Effort: Pulmonary effort is normal.     Breath sounds: Normal breath sounds.  Skin:    General: Skin is warm.  Neurological:     General: No focal deficit present.     Mental Status: She is alert and oriented to person, place, and time.  Psychiatric:        Mood and Affect: Mood normal.        Behavior: Behavior normal.      ED Treatments / Results  Labs (all labs ordered are listed, but only abnormal results  are displayed) Labs Reviewed  SARS  CORONAVIRUS 2 (HOSP ORDER, PERFORMED IN [facility] VIA ABBOTT ID)    EKG None  Radiology No results found.  Procedures Procedures (including critical care time)  Medications Ordered in ED Medications - No data to display   Initial Impression / Assessment and Plan / ED Course  I have reviewed the triage vital signs and the nursing notes.  Pertinent labs & imaging results that were available during my care of the patient were reviewed by me and considered in my medical decision making (see chart for details).      Pt's covid swab was negative.  She is instructed to return if worse.  F/u with pcp.  Final Clinical Impressions(s) / ED Diagnoses   Final diagnoses:  Covid-19 Virus not Detected    ED Discharge Orders    None       Isla Pence, MD 11/04/18 1317

## 2018-11-05 ENCOUNTER — Ambulatory Visit: Payer: BC Managed Care – PPO | Admitting: Orthotics

## 2018-11-05 DIAGNOSIS — M779 Enthesopathy, unspecified: Secondary | ICD-10-CM

## 2018-11-05 DIAGNOSIS — M2041 Other hammer toe(s) (acquired), right foot: Secondary | ICD-10-CM

## 2018-11-05 NOTE — Progress Notes (Signed)
Patient came in today to pick up custom made foot orthotics.  The goals were accomplished and the patient reported no dissatisfaction with said orthotics.  Patient was advised of breakin period and how to report any issues. 

## 2018-11-29 ENCOUNTER — Ambulatory Visit: Payer: BC Managed Care – PPO | Admitting: Podiatry

## 2018-11-29 ENCOUNTER — Ambulatory Visit (INDEPENDENT_AMBULATORY_CARE_PROVIDER_SITE_OTHER): Payer: BC Managed Care – PPO

## 2018-11-29 ENCOUNTER — Encounter: Payer: Self-pay | Admitting: Podiatry

## 2018-11-29 ENCOUNTER — Other Ambulatory Visit: Payer: Self-pay

## 2018-11-29 VITALS — Temp 97.4°F

## 2018-11-29 DIAGNOSIS — S99921A Unspecified injury of right foot, initial encounter: Secondary | ICD-10-CM

## 2018-11-29 DIAGNOSIS — T148XXA Other injury of unspecified body region, initial encounter: Secondary | ICD-10-CM

## 2018-11-29 DIAGNOSIS — M629 Disorder of muscle, unspecified: Secondary | ICD-10-CM

## 2018-11-29 DIAGNOSIS — T1490XA Injury, unspecified, initial encounter: Secondary | ICD-10-CM

## 2018-12-03 ENCOUNTER — Other Ambulatory Visit: Payer: Self-pay | Admitting: Podiatry

## 2018-12-03 DIAGNOSIS — S99921A Unspecified injury of right foot, initial encounter: Secondary | ICD-10-CM

## 2018-12-04 NOTE — Progress Notes (Signed)
Subjective: 63 year old female presents the office today for concerns of acute right foot pain that started 2 days ago.  She states that she is walking on a step and she heard a pop along the instep.  She states that she been having some plantar fasciitis pain for the last month.  This was new after last saw her.  She is noticed some swelling. Denies any systemic complaints such as fevers, chills, nausea, vomiting. No acute changes since last appointment, and no other complaints at this time.   Objective: AAO x3, NAD DP/PT pulses palpable bilaterally, CRT less than 3 seconds There is tenderness palpation as well as edema along medial band of plantar fascia along the distal one third.  There is tenderness palpation directly to this area.  There is no area of pinpoint tenderness.  No open lesions or pre-ulcerative lesions.  No pain with calf compression, swelling, warmth, erythema  Assessment: Plantar fascial tear  Plan: -All treatment options discussed with the patient including all alternatives, risks, complications.  -X-rays were obtained and reviewed.  No evidence of acute fracture. -Recommend immobilization in a cam boot.  This was dispensed today.  Continue ice elevation as well as Voltaren gel. -Patient encouraged to call the office with any questions, concerns, change in symptoms.   Return in about 3 weeks (around 12/20/2018).  Trula Slade DPM

## 2018-12-06 DIAGNOSIS — M7711 Lateral epicondylitis, right elbow: Secondary | ICD-10-CM | POA: Diagnosis not present

## 2018-12-27 DIAGNOSIS — H5213 Myopia, bilateral: Secondary | ICD-10-CM | POA: Diagnosis not present

## 2018-12-27 DIAGNOSIS — H04123 Dry eye syndrome of bilateral lacrimal glands: Secondary | ICD-10-CM | POA: Diagnosis not present

## 2019-01-09 ENCOUNTER — Other Ambulatory Visit: Payer: Self-pay | Admitting: Physician Assistant

## 2019-01-28 ENCOUNTER — Other Ambulatory Visit: Payer: Self-pay | Admitting: Family Medicine

## 2019-02-06 ENCOUNTER — Other Ambulatory Visit (HOSPITAL_BASED_OUTPATIENT_CLINIC_OR_DEPARTMENT_OTHER): Payer: Self-pay | Admitting: Family Medicine

## 2019-02-06 DIAGNOSIS — Z1231 Encounter for screening mammogram for malignant neoplasm of breast: Secondary | ICD-10-CM

## 2019-02-18 ENCOUNTER — Ambulatory Visit: Payer: BC Managed Care – PPO | Admitting: Orthotics

## 2019-02-18 ENCOUNTER — Other Ambulatory Visit: Payer: Self-pay

## 2019-02-18 DIAGNOSIS — M629 Disorder of muscle, unspecified: Secondary | ICD-10-CM

## 2019-02-18 DIAGNOSIS — T148XXA Other injury of unspecified body region, initial encounter: Secondary | ICD-10-CM

## 2019-02-18 NOTE — Progress Notes (Signed)
Softening the arch of f/o, plantar fas groove.

## 2019-02-19 ENCOUNTER — Ambulatory Visit (HOSPITAL_BASED_OUTPATIENT_CLINIC_OR_DEPARTMENT_OTHER): Payer: BC Managed Care – PPO

## 2019-03-04 ENCOUNTER — Ambulatory Visit: Payer: BC Managed Care – PPO | Admitting: Orthotics

## 2019-03-04 ENCOUNTER — Other Ambulatory Visit: Payer: Self-pay

## 2019-03-04 DIAGNOSIS — M2041 Other hammer toe(s) (acquired), right foot: Secondary | ICD-10-CM

## 2019-03-04 DIAGNOSIS — T148XXA Other injury of unspecified body region, initial encounter: Secondary | ICD-10-CM

## 2019-03-04 DIAGNOSIS — M629 Disorder of muscle, unspecified: Secondary | ICD-10-CM

## 2019-03-04 NOTE — Progress Notes (Signed)
Patient came in today to pick up custom made foot orthotics.  The goals were accomplished and the patient reported no dissatisfaction with said orthotics.  Patient was advised of breakin period and how to report any issues. 

## 2019-03-05 DIAGNOSIS — C44719 Basal cell carcinoma of skin of left lower limb, including hip: Secondary | ICD-10-CM | POA: Diagnosis not present

## 2019-03-05 DIAGNOSIS — Z9889 Other specified postprocedural states: Secondary | ICD-10-CM | POA: Insufficient documentation

## 2019-03-05 DIAGNOSIS — L988 Other specified disorders of the skin and subcutaneous tissue: Secondary | ICD-10-CM | POA: Insufficient documentation

## 2019-03-22 ENCOUNTER — Other Ambulatory Visit: Payer: Self-pay

## 2019-03-22 ENCOUNTER — Other Ambulatory Visit (HOSPITAL_BASED_OUTPATIENT_CLINIC_OR_DEPARTMENT_OTHER): Payer: Self-pay | Admitting: Emergency Medicine

## 2019-03-22 ENCOUNTER — Ambulatory Visit (HOSPITAL_BASED_OUTPATIENT_CLINIC_OR_DEPARTMENT_OTHER)
Admission: RE | Admit: 2019-03-22 | Discharge: 2019-03-22 | Disposition: A | Discharge status: Home / Self Care | Payer: BC Managed Care – PPO | Source: Ambulatory Visit | Attending: Emergency Medicine | Admitting: Emergency Medicine

## 2019-03-22 DIAGNOSIS — S99921A Unspecified injury of right foot, initial encounter: Secondary | ICD-10-CM | POA: Diagnosis not present

## 2019-03-22 DIAGNOSIS — R52 Pain, unspecified: Secondary | ICD-10-CM

## 2019-03-22 DIAGNOSIS — M79671 Pain in right foot: Secondary | ICD-10-CM | POA: Diagnosis not present

## 2019-03-22 DIAGNOSIS — S99922A Unspecified injury of left foot, initial encounter: Secondary | ICD-10-CM | POA: Diagnosis not present

## 2019-03-22 DIAGNOSIS — M79672 Pain in left foot: Secondary | ICD-10-CM | POA: Diagnosis not present

## 2019-03-25 ENCOUNTER — Other Ambulatory Visit: Payer: Self-pay

## 2019-03-25 ENCOUNTER — Encounter (HOSPITAL_BASED_OUTPATIENT_CLINIC_OR_DEPARTMENT_OTHER): Payer: Self-pay

## 2019-03-25 ENCOUNTER — Ambulatory Visit (HOSPITAL_BASED_OUTPATIENT_CLINIC_OR_DEPARTMENT_OTHER)
Admission: RE | Admit: 2019-03-25 | Discharge: 2019-03-25 | Disposition: A | Discharge status: Home / Self Care | Payer: BC Managed Care – PPO | Source: Ambulatory Visit | Attending: Family Medicine | Admitting: Family Medicine

## 2019-03-25 DIAGNOSIS — Z1231 Encounter for screening mammogram for malignant neoplasm of breast: Secondary | ICD-10-CM | POA: Insufficient documentation

## 2019-03-26 ENCOUNTER — Other Ambulatory Visit: Payer: Self-pay | Admitting: Family Medicine

## 2019-03-26 ENCOUNTER — Encounter: Payer: Self-pay | Admitting: Podiatry

## 2019-03-26 ENCOUNTER — Encounter: Payer: Self-pay | Admitting: Family Medicine

## 2019-03-26 ENCOUNTER — Ambulatory Visit: Payer: BC Managed Care – PPO | Admitting: Family Medicine

## 2019-03-26 ENCOUNTER — Ambulatory Visit: Payer: Self-pay

## 2019-03-26 ENCOUNTER — Other Ambulatory Visit: Payer: Self-pay

## 2019-03-26 VITALS — BP 115/71 | HR 77 | Ht 68.0 in | Wt 145.0 lb

## 2019-03-26 DIAGNOSIS — M79671 Pain in right foot: Secondary | ICD-10-CM

## 2019-03-26 DIAGNOSIS — R928 Other abnormal and inconclusive findings on diagnostic imaging of breast: Secondary | ICD-10-CM

## 2019-03-26 DIAGNOSIS — M25572 Pain in left ankle and joints of left foot: Secondary | ICD-10-CM

## 2019-03-26 DIAGNOSIS — M25872 Other specified joint disorders, left ankle and foot: Secondary | ICD-10-CM | POA: Diagnosis not present

## 2019-03-26 MED ORDER — METHYLPREDNISOLONE ACETATE 40 MG/ML IJ SUSP
20.0000 mg | Freq: Once | INTRAMUSCULAR | Status: AC
  Administered 2019-03-26: 20 mg via INTRA_ARTICULAR

## 2019-03-26 MED ORDER — PENNSAID 2 % EX SOLN: 1.0000 "application " | Freq: Two times a day (BID) | CUTANEOUS | 3 refills | Status: DC

## 2019-03-26 NOTE — Progress Notes (Signed)
Beth Frey - 63 y.o. female MRN [id number]  Date of birth: [birth year]  SUBJECTIVE:  Including CC & ROS.  Chief Complaint  Patient presents with  . Ankle Pain    left ankle  . Toe Pain    right foot 4th toe    Beth Frey is a 63 y.o. female that is resenting with left ankle pain and right foot pain.  She had a cancer removed from the distal anterior shin.  She was placed in a makeshift cast.  After the cast removed she started having pain over the anterior medial aspect of the ankle joint.  The pain is constant and severe.  The pain is sharp and stabbing.  The pain is worse with any ambulation.  She is having to use a cane to help with ambulation.  No history of similar pain.  A few weeks ago she also stubbed the space between the third and fourth digit on the right foot.  Since that time her fourth toe has everted and angulation.  The pain is worse with touch and ambulation.  The pain is localized to this interdigit webspace.  The pain is intermittent in nature.  Pain can be moderate to severe..  Independent review of the left foot x-ray from 11/14 shows no significant degenerative changes.  Independent review of the right foot x-ray from 11/14 shows bony deformity of the right great toe.   Review of Systems  Constitutional: Negative for fever.  HENT: Negative for congestion.   Respiratory: Negative for cough.   Cardiovascular: Negative for chest pain.  Gastrointestinal: Negative for abdominal pain.  Musculoskeletal: Positive for arthralgias and gait problem.  Skin: Negative for color change.  Neurological: Negative for weakness.  Hematological: Negative for adenopathy.    HISTORY: Past Medical, Surgical, Social, and Family History Reviewed & Updated per EMR.   Pertinent Historical Findings include:  Past Medical History:  Diagnosis Date  . Anxiety   . Back pain, chronic   . Cancer ([place])    skin  . 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 Osteoarthritis     Past Surgical History:   Procedure Laterality Date  . BREAST SURGERY  August 2011   Implants removed  . CHOLECYSTECTOMY    . COLON SURGERY  02/20/2010   Right hemicolectomy-cecal bascule  . ELBOW SURGERY     left elbow  . JOINT REPLACEMENT  2006   Left total knee replacement  . OVARIAN CYST REMOVAL     right  . SKIN CANCER EXCISION     multiple-SCC and BCC  . TUBAL LIGATION    . Uterine ablation    . WRIST SURGERY  2009   right wrist    Allergies  Allergen Reactions  . Fish Allergy Anaphylaxis  . Sulfa Antibiotics Itching and Swelling    Throat, body swelling  . Doxycycline     Sun   . Trazodone And Nefazodone Other (See Comments)    Serotonin syndrome d/t other medications    Family History  Problem Relation Age of Onset  . Cancer Mother        multiple myeloma  . Parkinson's disease Mother   . Cancer Father        stomach cancer  . Cancer Brother        pancreatic cancer  . Stroke Maternal Grandmother   . Cancer Maternal Grandfather        lung cancer  . Cancer Paternal Grandmother        ovarian  .  Heart disease Paternal Grandfather   . Cancer Brother        multiple myeloma     Social History   Socioeconomic History  . Marital status: Married    Spouse name: Not on file  . Number of children: Not on file  . Years of education: Not on file  . Highest education level: Not on file  Occupational History  . Not on file  Social Needs  . Financial resource strain: Not on file  . Food insecurity    Worry: Not on file    Inability: Not on file  . Transportation needs    Medical: Not on file    Non-medical: Not on file  Tobacco Use  . Smoking status: Never Smoker  . Smokeless tobacco: Never Used  Substance and Sexual Activity  . Alcohol use: No    Alcohol/week: 0.0 standard drinks  . Drug use: No  . Sexual activity: Not on file    Comment: married  Lifestyle  . Physical activity    Days per week: Not on file    Minutes per session: Not on file  . Stress: Not on  file  Relationships  . Social Herbalist on phone: Not on file    Gets together: Not on file    Attends religious service: Not on file    Active member of club or organization: Not on file    Attends meetings of clubs or organizations: Not on file    Relationship status: Not on file  . Intimate partner violence    Fear of current or ex partner: Not on file    Emotionally abused: Not on file    Physically abused: Not on file    Forced sexual activity: Not on file  Other Topics Concern  . Not on file  Social History Narrative  . Not on file     PHYSICAL EXAM:  VS: BP 115/71   Pulse 77   Ht '5\' 8"'  (1.727 m)   Wt 145 lb (65.8 kg)   LMP  (LMP Unknown)   BMI 22.05 kg/m  Physical Exam Gen: NAD, alert, cooperative with exam, well-appearing ENT: normal lips, normal nasal mucosa,  Eye: normal EOM, normal conjunctiva and lids CV:  no edema, +2 pedal pulses   Resp: no accessory muscle use, non-labored,  Skin: no rashes, no areas of induration  Neuro: normal tone, normal sensation to touch Psych:  normal insight, alert and oriented MSK:  Left ankle: Tenderness to palpation over the anterior medial ankle joint. Pain with dorsiflexion. No tenderness to palpation over the navicular at the insertion of the posterior tibialis. Normal strength resistance. Right foot: Splaying of the interdigit webspace between the third and fourth toe. Hammertoes present. Bunion present. Loss of the transverse arch. Neurovascularly intact  Limited ultrasound: Left ankle, right foot:  Left ankle: Soft tissue swelling apparent. Normal-appearing posterior tibialis as it courses inferior to the medial malleolus. No significant effusion within the ankle joint. Normal insertion of the posterior tibialis into the navicular. Normal-appearing plantar fascia. Normal-appearing Achilles.  Right foot: No significant effusion of the third or fourth MTP joint. No obvious fracture appreciated of  the phalanges of the third or fourth digit. No metatarsal fracture appreciated of the third or fourth. Change in the architecture of the interdigit webspace which could represent a form of fat necrosis.  No specific neuroma appreciated  Summary: left foot with no structural findings but possible impingement as to the source  of her pain. Right foot with no obvious fracture with possible interdigit web irritation.   Ultrasound and interpretation by Clearance Coots, MD   Aspiration/Injection Procedure Note Beth Frey 11-May-1955  Procedure: Injection Indications: left ankle pain   Procedure Details Consent: Risks of procedure as well as the alternatives and risks of each were explained to the (patient/caregiver).  Consent for procedure obtained. Time Out: Verified patient identification, verified procedure, site/side was marked, verified correct patient position, special equipment/implants available, medications/allergies/relevent history reviewed, required imaging and test results available.  Performed.  The area was cleaned with iodine and alcohol swabs.    The left anterior medial ankle joint was injected using 0.5 cc's of 40 mg Depo-Medrol and 0.5 cc's of 0.25% bupivacaine with a 25 1 1/2" needle.  Ultrasound was used. Images were obtained in long views showing the injection.     A sterile dressing was applied.  Patient did tolerate procedure well.      ASSESSMENT & PLAN:   Ankle impingement syndrome, left Pain seems related to impingement with the recent cast removal.  No significant structural changes appreciated on ultrasound.  Likely a dynamic process. -Injection. -Counseled on home exercise therapy and supportive care. -Could consider physical therapy.  Right foot pain Having pain between the interdigit webspace between the third and fourth metatarsal.  May have developed a bit of a neuroma irritation.  She does have splaying of the fourth digit which may be related to a  capsular problem. -Placed metatarsal pads on each insole of the left and right foot. -Pennsaid and sample provided. -Could consider injection or custom orthotics.

## 2019-03-26 NOTE — Progress Notes (Signed)
Medication Samples have been provided to the patient.  Drug name: Pennsaid      Strength: 2%        Qty: 1 Box  LOT: [id number]  Exp.Date: 12/2019  Dosing instructions: Use a peasize amount and rub gently.  The patient has been instructed regarding the correct time, dose, and frequency of taking this medication, including desired effects and most common side effects.   Sherrie George, [place] 12:20 PM 03/26/2019

## 2019-03-26 NOTE — Assessment & Plan Note (Signed)
Having pain between the interdigit webspace between the third and fourth metatarsal.  May have developed a bit of a neuroma irritation.  She does have splaying of the fourth digit which may be related to a capsular problem. -Placed metatarsal pads on each insole of the left and right foot. -Pennsaid and sample provided. -Could consider injection or custom orthotics.

## 2019-03-26 NOTE — Assessment & Plan Note (Signed)
Pain seems related to impingement with the recent cast removal.  No significant structural changes appreciated on ultrasound.  Likely a dynamic process. -Injection. -Counseled on home exercise therapy and supportive care. -Could consider physical therapy.

## 2019-03-26 NOTE — Patient Instructions (Signed)
Nice to meet you  Please try ice Please try the pennsaid  Please try the range of motion exercises  Please send me a message in MyChart with any questions or updates.  Please see me back in 4 weeks.   --Dr. Raeford Razor

## 2019-03-27 ENCOUNTER — Ambulatory Visit
Admission: RE | Admit: 2019-03-27 | Discharge: 2019-03-27 | Disposition: A | Discharge status: Home / Self Care | Payer: BC Managed Care – PPO | Source: Ambulatory Visit | Attending: Family Medicine | Admitting: Family Medicine

## 2019-03-27 ENCOUNTER — Other Ambulatory Visit: Payer: Self-pay | Admitting: Family Medicine

## 2019-03-27 DIAGNOSIS — R922 Inconclusive mammogram: Secondary | ICD-10-CM | POA: Diagnosis not present

## 2019-03-27 DIAGNOSIS — R928 Other abnormal and inconclusive findings on diagnostic imaging of breast: Secondary | ICD-10-CM

## 2019-03-27 DIAGNOSIS — N6320 Unspecified lump in the left breast, unspecified quadrant: Secondary | ICD-10-CM

## 2019-03-27 DIAGNOSIS — N6321 Unspecified lump in the left breast, upper outer quadrant: Secondary | ICD-10-CM | POA: Diagnosis not present

## 2019-03-28 ENCOUNTER — Encounter: Payer: Self-pay | Admitting: Family Medicine

## 2019-03-28 ENCOUNTER — Ambulatory Visit: Payer: BC Managed Care – PPO | Admitting: Podiatry

## 2019-03-30 ENCOUNTER — Encounter: Payer: Self-pay | Admitting: Family Medicine

## 2019-03-31 ENCOUNTER — Telehealth: Payer: Self-pay | Admitting: Family Medicine

## 2019-03-31 NOTE — Telephone Encounter (Signed)
Patient left a voicemail on November 20th stating she is experiencing a lot of pain in her ankle. She has been elevating her ankle, icing, and using the prescribed cream.   She is requesting to have a CT scan ordered

## 2019-03-31 NOTE — Telephone Encounter (Signed)
Patient's symptoms have improved. Will monitor for now.   Rosemarie Ax, MD [facility] 03/31/2019, 9:26 AM

## 2019-04-01 ENCOUNTER — Other Ambulatory Visit: Payer: Self-pay | Admitting: Family Medicine

## 2019-04-01 ENCOUNTER — Other Ambulatory Visit: Payer: Self-pay

## 2019-04-01 ENCOUNTER — Ambulatory Visit
Admission: RE | Admit: 2019-04-01 | Discharge: 2019-04-01 | Disposition: A | Discharge status: Home / Self Care | Payer: BC Managed Care – PPO | Source: Ambulatory Visit | Attending: Family Medicine | Admitting: Family Medicine

## 2019-04-01 DIAGNOSIS — N6320 Unspecified lump in the left breast, unspecified quadrant: Secondary | ICD-10-CM

## 2019-04-01 DIAGNOSIS — N6012 Diffuse cystic mastopathy of left breast: Secondary | ICD-10-CM | POA: Diagnosis not present

## 2019-04-01 DIAGNOSIS — N6321 Unspecified lump in the left breast, upper outer quadrant: Secondary | ICD-10-CM | POA: Diagnosis not present

## 2019-04-04 ENCOUNTER — Encounter: Payer: Self-pay | Admitting: Family Medicine

## 2019-04-04 DIAGNOSIS — N6012 Diffuse cystic mastopathy of left breast: Secondary | ICD-10-CM | POA: Insufficient documentation

## 2019-04-08 ENCOUNTER — Other Ambulatory Visit: Payer: Self-pay | Admitting: Family Medicine

## 2019-04-08 DIAGNOSIS — I10 Essential (primary) hypertension: Secondary | ICD-10-CM

## 2019-04-08 DIAGNOSIS — F411 Generalized anxiety disorder: Secondary | ICD-10-CM

## 2019-04-15 ENCOUNTER — Encounter: Payer: Self-pay | Admitting: Podiatry

## 2019-04-15 DIAGNOSIS — M76822 Posterior tibial tendinitis, left leg: Secondary | ICD-10-CM | POA: Diagnosis not present

## 2019-04-15 DIAGNOSIS — M25572 Pain in left ankle and joints of left foot: Secondary | ICD-10-CM | POA: Diagnosis not present

## 2019-04-15 DIAGNOSIS — M25562 Pain in left knee: Secondary | ICD-10-CM | POA: Diagnosis not present

## 2019-04-17 ENCOUNTER — Ambulatory Visit: Payer: BC Managed Care – PPO | Admitting: Podiatry

## 2019-04-23 ENCOUNTER — Ambulatory Visit: Payer: BC Managed Care – PPO | Admitting: Family Medicine

## 2019-04-28 ENCOUNTER — Encounter: Payer: Self-pay | Admitting: Family Medicine

## 2019-04-29 ENCOUNTER — Other Ambulatory Visit: Payer: Self-pay

## 2019-04-29 DIAGNOSIS — M79671 Pain in right foot: Secondary | ICD-10-CM | POA: Diagnosis not present

## 2019-04-29 DIAGNOSIS — Z1211 Encounter for screening for malignant neoplasm of colon: Secondary | ICD-10-CM

## 2019-04-29 DIAGNOSIS — M25571 Pain in right ankle and joints of right foot: Secondary | ICD-10-CM | POA: Diagnosis not present

## 2019-04-30 ENCOUNTER — Other Ambulatory Visit: Payer: Self-pay

## 2019-04-30 ENCOUNTER — Encounter: Payer: Self-pay | Admitting: Family Medicine

## 2019-04-30 ENCOUNTER — Other Ambulatory Visit (HOSPITAL_COMMUNITY): Payer: Self-pay | Admitting: Sports Medicine

## 2019-04-30 ENCOUNTER — Ambulatory Visit (HOSPITAL_COMMUNITY)
Admission: RE | Admit: 2019-04-30 | Discharge: 2019-04-30 | Disposition: A | Discharge status: Home / Self Care | Payer: BC Managed Care – PPO | Source: Ambulatory Visit | Attending: Sports Medicine | Admitting: Sports Medicine

## 2019-04-30 DIAGNOSIS — M7989 Other specified soft tissue disorders: Secondary | ICD-10-CM | POA: Diagnosis not present

## 2019-04-30 DIAGNOSIS — M79605 Pain in left leg: Secondary | ICD-10-CM

## 2019-04-30 DIAGNOSIS — M25572 Pain in left ankle and joints of left foot: Secondary | ICD-10-CM | POA: Diagnosis not present

## 2019-06-02 DIAGNOSIS — Z1211 Encounter for screening for malignant neoplasm of colon: Secondary | ICD-10-CM | POA: Diagnosis not present

## 2019-06-02 LAB — COLOGUARD: Cologuard: POSITIVE — AB

## 2019-06-06 ENCOUNTER — Telehealth: Payer: Self-pay

## 2019-06-06 ENCOUNTER — Encounter: Payer: Self-pay | Admitting: Family Medicine

## 2019-06-06 DIAGNOSIS — Z1211 Encounter for screening for malignant neoplasm of colon: Secondary | ICD-10-CM

## 2019-06-06 NOTE — Telephone Encounter (Signed)
Called pt to give Cologuard results, placed external referral for GI, upon patient's requests.

## 2019-06-09 ENCOUNTER — Encounter: Payer: Self-pay | Admitting: Family Medicine

## 2019-06-09 ENCOUNTER — Other Ambulatory Visit: Payer: Self-pay | Admitting: Family Medicine

## 2019-06-09 ENCOUNTER — Telehealth: Payer: Self-pay

## 2019-06-09 LAB — COLOGUARD: Cologuard: POSITIVE — AB

## 2019-06-09 NOTE — Telephone Encounter (Signed)
Results abstracted and faxed to number provided.

## 2019-06-09 NOTE — Telephone Encounter (Signed)
[facility] is requesting additional notes for this patient. Patient is schedule to come in the office on 06/13/19 at 8:45am with Beth Frey . Please call Beth Frey at [phone] or please fax over to [phone] Attn Referrals

## 2019-06-13 DIAGNOSIS — R195 Other fecal abnormalities: Secondary | ICD-10-CM | POA: Diagnosis not present

## 2019-06-13 DIAGNOSIS — K219 Gastro-esophageal reflux disease without esophagitis: Secondary | ICD-10-CM | POA: Diagnosis not present

## 2019-06-13 DIAGNOSIS — Z800 Family history of malignant neoplasm of digestive organs: Secondary | ICD-10-CM | POA: Diagnosis not present

## 2019-06-24 DIAGNOSIS — R6884 Jaw pain: Secondary | ICD-10-CM | POA: Diagnosis not present

## 2019-06-26 DIAGNOSIS — Z1159 Encounter for screening for other viral diseases: Secondary | ICD-10-CM | POA: Diagnosis not present

## 2019-06-30 ENCOUNTER — Ambulatory Visit (INDEPENDENT_AMBULATORY_CARE_PROVIDER_SITE_OTHER): Payer: BC Managed Care – PPO | Admitting: Family Medicine

## 2019-06-30 ENCOUNTER — Other Ambulatory Visit: Payer: Self-pay

## 2019-06-30 ENCOUNTER — Encounter: Payer: Self-pay | Admitting: Family Medicine

## 2019-06-30 VITALS — BP 110/70 | HR 72 | Temp 97.2°F | Ht 68.0 in | Wt 159.0 lb

## 2019-06-30 DIAGNOSIS — Z0000 Encounter for general adult medical examination without abnormal findings: Secondary | ICD-10-CM

## 2019-06-30 DIAGNOSIS — E559 Vitamin D deficiency, unspecified: Secondary | ICD-10-CM

## 2019-06-30 DIAGNOSIS — E782 Mixed hyperlipidemia: Secondary | ICD-10-CM

## 2019-06-30 LAB — CBC WITH DIFFERENTIAL/PLATELET
Basophils Absolute: 0 10*3/uL (ref 0.0–0.1)
Basophils Relative: 0.3 % (ref 0.0–3.0)
Eosinophils Absolute: 0.3 10*3/uL (ref 0.0–0.7)
Eosinophils Relative: 4.7 % (ref 0.0–5.0)
HCT: 40.3 % (ref 36.0–46.0)
Hemoglobin: 13.4 g/dL (ref 12.0–15.0)
Lymphocytes Relative: 44.9 % (ref 12.0–46.0)
Lymphs Abs: 3.2 10*3/uL (ref 0.7–4.0)
MCHC: 33.3 g/dL (ref 30.0–36.0)
MCV: 87.5 fl (ref 78.0–100.0)
Monocytes Absolute: 0.6 10*3/uL (ref 0.1–1.0)
Monocytes Relative: 8.1 % (ref 3.0–12.0)
Neutro Abs: 3 10*3/uL (ref 1.4–7.7)
Neutrophils Relative %: 42 % — ABNORMAL LOW (ref 43.0–77.0)
Platelets: 294 10*3/uL (ref 150.0–400.0)
RBC: 4.6 Mil/uL (ref 3.87–5.11)
RDW: 13.4 % (ref 11.5–15.5)
WBC: 7.2 10*3/uL (ref 4.0–10.5)

## 2019-06-30 LAB — TSH: TSH: 1.49 u[IU]/mL (ref 0.35–4.50)

## 2019-06-30 LAB — VITAMIN D 25 HYDROXY (VIT D DEFICIENCY, FRACTURES): VITD: 59.86 ng/mL (ref 30.00–100.00)

## 2019-06-30 NOTE — Progress Notes (Signed)
Patient: Beth Frey MRN: [id number] DOB: 05-24-1955 PCP: Orma Flaming, MD     Subjective:  Chief Complaint  Patient presents with  . Annual Exam  . Hyperlipidemia  . Abdominal Pain    Pt complains of right lower quadrant pain.     HPI: The patient is a 64 y.o. female who presents today for annual exam. She denies any changes to past medical history. There have been no recent hospitalizations. They are following a well balanced diet and exercise plan. Weight has been stable. No complaints today.    Hyperlipidemia: labs today. Never been on medication.   HTN: she is taking 0.1mg  BID. We have weaned her down over a year. She is having a hard time getting off of this. She is still trying to wean off of this. No real issue with HTN, was started for hormonal issue.   Immunization History  Administered Date(s) Administered  . Tdap 10/20/2013   Colonoscopy: tomorrow.  Mammogram: 03/2019 Pap smear: 12/2017 Flu: declined.   Review of Systems  Constitutional: Negative for chills, fatigue and fever.  HENT: Negative for dental problem, ear pain, hearing loss, sinus pressure, sinus pain, sore throat and trouble swallowing.   Eyes: Negative for visual disturbance.  Respiratory: Negative for cough, chest tightness, shortness of breath and wheezing.   Cardiovascular: Negative for chest pain, palpitations and leg swelling.  Gastrointestinal: Positive for abdominal pain. Negative for blood in stool, diarrhea and nausea.  Endocrine: Negative for cold intolerance, polydipsia, polyphagia and polyuria.  Genitourinary: Negative for dysuria, flank pain, frequency, hematuria and urgency.  Musculoskeletal: Negative for arthralgias.  Skin: Negative for color change, rash and wound.  Neurological: Negative for dizziness, syncope and headaches.  Psychiatric/Behavioral: Negative for dysphoric mood and sleep disturbance. The patient is not nervous/anxious.     Allergies Patient is allergic to fish  allergy; sulfa antibiotics; doxycycline; and trazodone and nefazodone.  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  Surgical History Patient  has a past surgical history that includes Wrist surgery (2009); Elbow surgery; Ovarian cyst removal; Skin cancer excision; Uterine ablation; Tubal ligation; Cholecystectomy; Colon surgery (02/20/2010); Joint replacement (2006); Breast surgery (August 2011); and Augmentation mammaplasty.  Family History Pateint's family history includes Cancer in her brother, brother, father, maternal grandfather, mother, and paternal grandmother; Heart disease in her paternal grandfather; Parkinson's disease in her mother; Stroke in her maternal grandmother.  Social History Patient  reports that she has never smoked. She has never used smokeless tobacco. She reports that she does not drink alcohol or use drugs.    Objective: Vitals:   06/30/19 1123  BP: 110/70  Pulse: 72  Temp: (!) 97.2 F (36.2 C)  TempSrc: Temporal  SpO2: 99%  Weight: 159 lb (72.1 kg)  Height: 5\' 8"  (1.727 m)    Body mass index is 24.18 kg/m.  Physical Exam Vitals reviewed.  Constitutional:      Appearance: She is well-developed and normal weight.  HENT:     Head: Normocephalic and atraumatic.     Right Ear: External ear normal.     Left Ear: External ear normal.     Mouth/Throat:     Mouth: Mucous membranes are moist.  Eyes:     Extraocular Movements: Extraocular movements intact.     Conjunctiva/sclera: Conjunctivae normal.     Pupils: Pupils are equal, round, and reactive to light.  Neck:     Thyroid: No  thyromegaly.  Cardiovascular:     Rate and Rhythm: Normal rate and regular rhythm.     Heart sounds: Normal heart sounds. No murmur.  Pulmonary:     Effort: Pulmonary effort is normal.     Breath sounds: Normal breath sounds.  Abdominal:     General: Abdomen is flat.  Bowel sounds are normal. There is no distension.     Palpations: Abdomen is soft.     Tenderness: There is abdominal tenderness in the right lower quadrant. There is no guarding or rebound.     Hernia: A hernia is present. Hernia is present in the umbilical area. There is no hernia in the right femoral area or right inguinal area.  Musculoskeletal:     Cervical back: Normal range of motion and neck supple.  Lymphadenopathy:     Cervical: No cervical adenopathy.  Skin:    General: Skin is warm and dry.     Capillary Refill: Capillary refill takes less than 2 seconds.     Findings: No rash.  Neurological:     General: No focal deficit present.     Mental Status: She is alert and oriented to person, place, and time.     Cranial Nerves: No cranial nerve deficit.     Coordination: Coordination normal.     Deep Tendon Reflexes: Reflexes normal.  Psychiatric:        Mood and Affect: Mood normal.        Behavior: Behavior normal.          Office Visit from 06/30/2019 in [facility]  PHQ-2 Total Score  0      Assessment/plan: 1. Vitamin D deficiency  - VITAMIN D 25 Hydroxy (Vit-D Deficiency, Fractures)  2. Annual physical exam Routine lab work today. She is fasting. UTD on HM. cscope and endoscopy scheduled for tomorrow. Chronic RLQ pain, work up by GI. Starting to exercise again. Looks great, continue health diet/exercise. Continue to wean off clonidine. F/u in one year or as needed.  Patient counseling [x]    Nutrition: Stressed importance of moderation in sodium/caffeine intake, saturated fat and cholesterol, caloric balance, sufficient intake of fresh fruits, vegetables, fiber, calcium, iron, and 1 mg of folate supplement per day (for females capable of pregnancy).  [x]    Stressed the importance of regular exercise.   []    Substance Abuse: Discussed cessation/primary prevention of tobacco, alcohol, or other drug use; driving or other dangerous activities under  the influence; availability of treatment for abuse.   [x]    Injury prevention: Discussed safety belts, safety helmets, smoke detector, smoking near bedding or upholstery.   [x]    Sexuality: Discussed sexually transmitted diseases, partner selection, use of condoms, avoidance of unintended pregnancy  and contraceptive alternatives.  [x]    Dental health: Discussed importance of regular tooth brushing, flossing, and dental visits.  [x]    Health maintenance and immunizations reviewed. Please refer to Health maintenance section.    - CBC with Differential/Platelet - Comprehensive metabolic panel - TSH  3. Mixed hyperlipidemia - Lipid panel  -continue to wean off clonidine. F/u in 6 months if needed to see where she is at and watch bp.   This visit occurred during the SARS-CoV-2 public health emergency.  Safety protocols were in place, including screening questions prior to the visit, additional usage of staff PPE, and extensive cleaning of exam room while observing appropriate contact time as indicated for disinfecting solutions.    Return in about 1 year (around 06/29/2020).  Orma Flaming, MD [place]  06/30/2019

## 2019-07-01 DIAGNOSIS — K219 Gastro-esophageal reflux disease without esophagitis: Secondary | ICD-10-CM | POA: Diagnosis not present

## 2019-07-01 DIAGNOSIS — Z980 Intestinal bypass and anastomosis status: Secondary | ICD-10-CM | POA: Diagnosis not present

## 2019-07-01 DIAGNOSIS — D122 Benign neoplasm of ascending colon: Secondary | ICD-10-CM | POA: Diagnosis not present

## 2019-07-01 DIAGNOSIS — D128 Benign neoplasm of rectum: Secondary | ICD-10-CM | POA: Diagnosis not present

## 2019-07-01 DIAGNOSIS — R195 Other fecal abnormalities: Secondary | ICD-10-CM | POA: Diagnosis not present

## 2019-07-01 DIAGNOSIS — D125 Benign neoplasm of sigmoid colon: Secondary | ICD-10-CM | POA: Diagnosis not present

## 2019-07-01 LAB — COMPREHENSIVE METABOLIC PANEL
ALT: 24 U/L (ref 0–35)
AST: 20 U/L (ref 0–37)
Albumin: 4.8 g/dL (ref 3.5–5.2)
Alkaline Phosphatase: 63 U/L (ref 39–117)
BUN: 15 mg/dL (ref 6–23)
CO2: 25 mEq/L (ref 19–32)
Calcium: 10.3 mg/dL (ref 8.4–10.5)
Chloride: 100 mEq/L (ref 96–112)
Creatinine, Ser: 0.8 mg/dL (ref 0.40–1.20)
GFR: 72.5 mL/min (ref >60.00)
Glucose, Bld: 101 mg/dL — ABNORMAL HIGH (ref 70–99)
Potassium: 4.6 mEq/L (ref 3.5–5.1)
Sodium: 137 mEq/L (ref 135–145)
Total Bilirubin: 0.9 mg/dL (ref 0.2–1.2)
Total Protein: 7.4 g/dL (ref 6.0–8.3)

## 2019-07-01 LAB — LIPID PANEL
Cholesterol: 330 mg/dL — ABNORMAL HIGH (ref 0–200)
HDL: 67.5 mg/dL (ref >39.00)
LDL Cholesterol: 235 mg/dL — ABNORMAL HIGH (ref 0–99)
NonHDL: 262.04
Total CHOL/HDL Ratio: 5
Triglycerides: 134 mg/dL (ref 0.0–149.0)
VLDL: 26.8 mg/dL (ref 0.0–40.0)

## 2019-07-04 DIAGNOSIS — M25561 Pain in right knee: Secondary | ICD-10-CM | POA: Diagnosis not present

## 2019-07-04 DIAGNOSIS — M7061 Trochanteric bursitis, right hip: Secondary | ICD-10-CM | POA: Diagnosis not present

## 2019-07-04 DIAGNOSIS — M25551 Pain in right hip: Secondary | ICD-10-CM | POA: Diagnosis not present

## 2019-07-08 ENCOUNTER — Other Ambulatory Visit: Payer: Self-pay | Admitting: Family Medicine

## 2019-07-08 NOTE — Telephone Encounter (Signed)
Approve?   Please Advise.

## 2019-07-09 ENCOUNTER — Telehealth: Payer: Self-pay | Admitting: Family Medicine

## 2019-07-09 NOTE — Telephone Encounter (Signed)
Noted  

## 2019-07-09 NOTE — Telephone Encounter (Signed)
Pt refused flu shot.

## 2019-08-08 DIAGNOSIS — M25551 Pain in right hip: Secondary | ICD-10-CM | POA: Diagnosis not present

## 2019-08-08 DIAGNOSIS — M7061 Trochanteric bursitis, right hip: Secondary | ICD-10-CM | POA: Diagnosis not present

## 2019-09-09 ENCOUNTER — Encounter: Payer: Self-pay | Admitting: Family Medicine

## 2019-09-12 ENCOUNTER — Encounter: Payer: Self-pay | Admitting: Family Medicine

## 2019-10-08 ENCOUNTER — Other Ambulatory Visit: Payer: Self-pay | Admitting: Family Medicine

## 2019-10-10 ENCOUNTER — Encounter: Payer: Self-pay | Admitting: Family Medicine

## 2019-10-10 DIAGNOSIS — M26609 Unspecified temporomandibular joint disorder, unspecified side: Secondary | ICD-10-CM | POA: Insufficient documentation

## 2019-11-04 ENCOUNTER — Encounter (HOSPITAL_BASED_OUTPATIENT_CLINIC_OR_DEPARTMENT_OTHER): Payer: Self-pay | Admitting: *Deleted

## 2019-11-04 ENCOUNTER — Observation Stay (HOSPITAL_BASED_OUTPATIENT_CLINIC_OR_DEPARTMENT_OTHER)
Admission: EM | Admit: 2019-11-04 | Discharge: 2019-11-05 | Disposition: A | Discharge status: Home / Self Care | Payer: BC Managed Care – PPO | Attending: Family Medicine | Admitting: Family Medicine

## 2019-11-04 ENCOUNTER — Other Ambulatory Visit: Payer: Self-pay

## 2019-11-04 ENCOUNTER — Observation Stay (HOSPITAL_COMMUNITY): Payer: BC Managed Care – PPO

## 2019-11-04 DIAGNOSIS — Z20822 Contact with and (suspected) exposure to covid-19: Secondary | ICD-10-CM | POA: Insufficient documentation

## 2019-11-04 DIAGNOSIS — I10 Essential (primary) hypertension: Secondary | ICD-10-CM

## 2019-11-04 DIAGNOSIS — Z79899 Other long term (current) drug therapy: Secondary | ICD-10-CM | POA: Insufficient documentation

## 2019-11-04 DIAGNOSIS — Z881 Allergy status to other antibiotic agents status: Secondary | ICD-10-CM | POA: Insufficient documentation

## 2019-11-04 DIAGNOSIS — I447 Left bundle-branch block, unspecified: Secondary | ICD-10-CM | POA: Diagnosis not present

## 2019-11-04 DIAGNOSIS — Z882 Allergy status to sulfonamides status: Secondary | ICD-10-CM | POA: Diagnosis not present

## 2019-11-04 DIAGNOSIS — S8000XA Contusion of unspecified knee, initial encounter: Secondary | ICD-10-CM | POA: Insufficient documentation

## 2019-11-04 DIAGNOSIS — F419 Anxiety disorder, unspecified: Secondary | ICD-10-CM | POA: Diagnosis not present

## 2019-11-04 DIAGNOSIS — Z791 Long term (current) use of non-steroidal anti-inflammatories (NSAID): Secondary | ICD-10-CM | POA: Diagnosis not present

## 2019-11-04 DIAGNOSIS — Z03818 Encounter for observation for suspected exposure to other biological agents ruled out: Secondary | ICD-10-CM | POA: Diagnosis not present

## 2019-11-04 DIAGNOSIS — Z85828 Personal history of other malignant neoplasm of skin: Secondary | ICD-10-CM | POA: Insufficient documentation

## 2019-11-04 DIAGNOSIS — Z888 Allergy status to other drugs, medicaments and biological substances status: Secondary | ICD-10-CM | POA: Diagnosis not present

## 2019-11-04 DIAGNOSIS — E785 Hyperlipidemia, unspecified: Secondary | ICD-10-CM | POA: Insufficient documentation

## 2019-11-04 DIAGNOSIS — F329 Major depressive disorder, single episode, unspecified: Secondary | ICD-10-CM | POA: Insufficient documentation

## 2019-11-04 DIAGNOSIS — Y92511 Restaurant or cafe as the place of occurrence of the external cause: Secondary | ICD-10-CM | POA: Diagnosis not present

## 2019-11-04 DIAGNOSIS — H5500 Unspecified nystagmus: Secondary | ICD-10-CM | POA: Diagnosis not present

## 2019-11-04 DIAGNOSIS — E876 Hypokalemia: Secondary | ICD-10-CM | POA: Insufficient documentation

## 2019-11-04 DIAGNOSIS — Z9049 Acquired absence of other specified parts of digestive tract: Secondary | ICD-10-CM | POA: Insufficient documentation

## 2019-11-04 DIAGNOSIS — Z96652 Presence of left artificial knee joint: Secondary | ICD-10-CM | POA: Diagnosis not present

## 2019-11-04 DIAGNOSIS — C4490 Unspecified malignant neoplasm of skin, unspecified: Secondary | ICD-10-CM | POA: Diagnosis not present

## 2019-11-04 DIAGNOSIS — M1990 Unspecified osteoarthritis, unspecified site: Secondary | ICD-10-CM | POA: Insufficient documentation

## 2019-11-04 DIAGNOSIS — R42 Dizziness and giddiness: Secondary | ICD-10-CM | POA: Diagnosis present

## 2019-11-04 DIAGNOSIS — F411 Generalized anxiety disorder: Secondary | ICD-10-CM

## 2019-11-04 DIAGNOSIS — W1830XA Fall on same level, unspecified, initial encounter: Secondary | ICD-10-CM | POA: Diagnosis not present

## 2019-11-04 DIAGNOSIS — R55 Syncope and collapse: Secondary | ICD-10-CM | POA: Diagnosis not present

## 2019-11-04 LAB — URINALYSIS, ROUTINE W REFLEX MICROSCOPIC
Bilirubin Urine: NEGATIVE
Glucose, UA: NEGATIVE mg/dL
Hgb urine dipstick: NEGATIVE
Ketones, ur: NEGATIVE mg/dL
Leukocytes,Ua: NEGATIVE
Nitrite: NEGATIVE
Protein, ur: NEGATIVE mg/dL
Specific Gravity, Urine: 1.004 — ABNORMAL LOW (ref 1.005–1.030)
pH: 6 (ref 5.0–8.0)

## 2019-11-04 LAB — COMPREHENSIVE METABOLIC PANEL
ALT: 22 U/L (ref 0–44)
AST: 21 U/L (ref 15–41)
Albumin: 4.1 g/dL (ref 3.5–5.0)
Alkaline Phosphatase: 48 U/L (ref 38–126)
Anion gap: 10 (ref 5–15)
BUN: 24 mg/dL — ABNORMAL HIGH (ref 8–23)
CO2: 23 mmol/L (ref 22–32)
Calcium: 9.1 mg/dL (ref 8.9–10.3)
Chloride: 104 mmol/L (ref 98–111)
Creatinine, Ser: 0.77 mg/dL (ref 0.44–1.00)
GFR calc Af Amer: >60 mL/min (ref >60)
GFR calc non Af Amer: >60 mL/min (ref >60)
Glucose, Bld: 91 mg/dL (ref 70–99)
Potassium: 4 mmol/L (ref 3.5–5.1)
Sodium: 137 mmol/L (ref 135–145)
Total Bilirubin: 0.8 mg/dL (ref 0.3–1.2)
Total Protein: 7.1 g/dL (ref 6.5–8.1)

## 2019-11-04 LAB — CBC WITH DIFFERENTIAL/PLATELET
Abs Immature Granulocytes: 0.05 10*3/uL (ref 0.00–0.07)
Basophils Absolute: 0 10*3/uL (ref 0.0–0.1)
Basophils Relative: 1 %
Eosinophils Absolute: 0.3 10*3/uL (ref 0.0–0.5)
Eosinophils Relative: 4 %
HCT: 39.7 % (ref 36.0–46.0)
Hemoglobin: 12.9 g/dL (ref 12.0–15.0)
Immature Granulocytes: 1 %
Lymphocytes Relative: 41 %
Lymphs Abs: 3.3 10*3/uL (ref 0.7–4.0)
MCH: 28.9 pg (ref 26.0–34.0)
MCHC: 32.5 g/dL (ref 30.0–36.0)
MCV: 88.8 fL (ref 80.0–100.0)
Monocytes Absolute: 0.7 10*3/uL (ref 0.1–1.0)
Monocytes Relative: 8 %
Neutro Abs: 3.6 10*3/uL (ref 1.7–7.7)
Neutrophils Relative %: 45 %
Platelets: 282 10*3/uL (ref 150–400)
RBC: 4.47 MIL/uL (ref 3.87–5.11)
RDW: 13 % (ref 11.5–15.5)
WBC: 7.9 10*3/uL (ref 4.0–10.5)
nRBC: 0 % (ref 0.0–0.2)

## 2019-11-04 LAB — TROPONIN I (HIGH SENSITIVITY)
Troponin I (High Sensitivity): 4 ng/L (ref <18)
Troponin I (High Sensitivity): 5 ng/L (ref <18)

## 2019-11-04 LAB — TSH: TSH: 1.308 u[IU]/mL (ref 0.350–4.500)

## 2019-11-04 LAB — SARS CORONAVIRUS 2 BY RT PCR (HOSPITAL ORDER, PERFORMED IN ~~LOC~~ HOSPITAL LAB): SARS Coronavirus 2: NEGATIVE

## 2019-11-04 MED ORDER — ACETAMINOPHEN 650 MG RE SUPP: 650.0000 mg | Freq: Four times a day (QID) | RECTAL | Status: DC | PRN

## 2019-11-04 MED ORDER — ONDANSETRON HCL 4 MG PO TABS: 4.0000 mg | ORAL_TABLET | Freq: Four times a day (QID) | ORAL | Status: DC | PRN

## 2019-11-04 MED ORDER — SERTRALINE HCL 50 MG PO TABS
50.0000 mg | ORAL_TABLET | Freq: Every day | ORAL | Status: DC
  Administered 2019-11-05: 50 mg via ORAL
  Filled 2019-11-04: qty 1

## 2019-11-04 MED ORDER — ASPIRIN EC 81 MG PO TBEC
81.0000 mg | DELAYED_RELEASE_TABLET | Freq: Every day | ORAL | Status: DC
  Administered 2019-11-04 – 2019-11-05 (×2): 81 mg via ORAL
  Filled 2019-11-04 (×2): qty 1

## 2019-11-04 MED ORDER — ACETAMINOPHEN 325 MG PO TABS: 650.0000 mg | ORAL_TABLET | Freq: Four times a day (QID) | ORAL | Status: DC | PRN

## 2019-11-04 MED ORDER — LACTATED RINGERS IV BOLUS
1000.0000 mL | Freq: Once | INTRAVENOUS | Status: AC
  Administered 2019-11-04: 1000 mL via INTRAVENOUS

## 2019-11-04 MED ORDER — POLYETHYLENE GLYCOL 3350 17 G PO PACK: 17.0000 g | PACK | Freq: Every day | ORAL | Status: DC | PRN

## 2019-11-04 MED ORDER — ENOXAPARIN SODIUM 40 MG/0.4ML ~~LOC~~ SOLN
40.0000 mg | SUBCUTANEOUS | Status: DC
  Administered 2019-11-04: 40 mg via SUBCUTANEOUS
  Filled 2019-11-04: qty 0.4

## 2019-11-04 MED ORDER — ONDANSETRON HCL 4 MG/2ML IJ SOLN: 4.0000 mg | Freq: Four times a day (QID) | INTRAMUSCULAR | Status: DC | PRN

## 2019-11-04 MED ORDER — SODIUM CHLORIDE 0.9% FLUSH
3.0000 mL | Freq: Two times a day (BID) | INTRAVENOUS | Status: DC
  Administered 2019-11-04: 3 mL via INTRAVENOUS

## 2019-11-04 MED ORDER — DIPHENHYDRAMINE HCL 50 MG/ML IJ SOLN
25.0000 mg | Freq: Once | INTRAMUSCULAR | Status: AC
  Administered 2019-11-04: 25 mg via INTRAVENOUS
  Filled 2019-11-04: qty 1

## 2019-11-04 MED ORDER — POTASSIUM CHLORIDE CRYS ER 20 MEQ PO TBCR
30.0000 meq | EXTENDED_RELEASE_TABLET | Freq: Every day | ORAL | Status: DC
  Administered 2019-11-05: 30 meq via ORAL
  Filled 2019-11-04: qty 1

## 2019-11-04 MED ORDER — HYDROCODONE-ACETAMINOPHEN 5-325 MG PO TABS: 1.0000 | ORAL_TABLET | ORAL | Status: DC | PRN

## 2019-11-04 MED ORDER — CLONIDINE HCL 0.1 MG PO TABS
0.1000 mg | ORAL_TABLET | Freq: Two times a day (BID) | ORAL | Status: DC
  Administered 2019-11-04 – 2019-11-05 (×2): 0.1 mg via ORAL
  Filled 2019-11-04 (×2): qty 1

## 2019-11-04 MED ORDER — ALBUTEROL SULFATE (2.5 MG/3ML) 0.083% IN NEBU: 2.5000 mg | INHALATION_SOLUTION | RESPIRATORY_TRACT | Status: DC | PRN

## 2019-11-04 NOTE — ED Notes (Signed)
[facility] at bedside 

## 2019-11-04 NOTE — Care Plan (Signed)
[facility] Plan of Care Note  Patient: Beth Frey    [id number]  PCP: Beth Flaming, MD    DOB: [birth year]  DOS: 11/04/2019   Received a phone call from Facility: [facility] regarding transfer of [name]. Requesting: Dr. Alvino Frey Reason for transfer: new LBBB with syncope History: history of colectomy and hypokalemia. Passed out in BR with no preceding symptoms.  Vitals: stable Labs: Troponin wnl Exam: no CP, no SOBper ED provider Treatment received: LR bolus 1 L  Plan of care: The patient will be accepted for admission to  [facility], Telemetry unit. Cardiology will be consulted by requesting physician prior to transfer.   ED provider spoke with Cardiology (Dr. Stanford Frey) who is ok with patient coming to [facility]. Cardiology will evaluate patient on 11/05/19  Author: Oretha Milch MD [facility] 11/04/2019  If 7PM-7AM, please contact night-coverage at [email or web address],

## 2019-11-04 NOTE — ED Provider Notes (Signed)
[facility] EMERGENCY DEPARTMENT Provider Note   CSN: [id number] Arrival date & time: 11/04/19  1021     History Chief Complaint  Patient presents with  . Dizziness    Beth Frey is a 64 y.o. female.  HPI Patient presents after syncopal episode.  States she was at [employer] after having eaten breakfast.  Went to the bathroom.  States she was feeling lightheaded and then sweaty.  States she woke up on the floor.  No injury from falling.  Was not incontinent of urine.  Did not have a postictal period.  Has not episode like this before.  No history of syncope.  No chest pain.  Does have history of a bowel resection and sometimes will get hypokalemic.  She is on chronic supplementation.    Past Medical History:  Diagnosis Date  . Anxiety   . Back pain, chronic   . Cancer ([place])    skin  . 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 Osteoarthritis     Patient Active Problem List   Diagnosis Date Noted  . Temporomandibular joint disorder (TMJ) 10/10/2019  . Fibrocystic change of breast, left 04/04/2019  . Ankle impingement syndrome, left 03/26/2019  . Right foot pain 03/26/2019  . History of prosthetic unicompartmental arthroplasty of left knee 07/23/2017  . Adjustment insomnia 10/31/2016  . Carcinoma in situ of skin of eyelid including canthus 06/26/2012  . Hyperlipidemia 11/23/2011  . Vitamin D deficiency 11/23/2011  . History of abnormal cervical Pap smear 11/23/2011  . Thyroid nodule 11/23/2011  . Skin cancer 11/23/2011  . Chronic hypokalemia 11/23/2011  . Incisional hernia 06/14/2011    Past Surgical History:  Procedure Laterality Date  . AUGMENTATION MAMMAPLASTY     Pt has had explants  . BREAST SURGERY  August 2011   Implants removed  . COLON SURGERY  02/20/2010   Right hemicolectomy-cecal bascule  . ELBOW SURGERY     left elbow  . JOINT REPLACEMENT  2006   Left total knee replacement  . OVARIAN CYST REMOVAL     right  . SKIN CANCER EXCISION     multiple-SCC  and BCC  . TUBAL LIGATION    . Uterine ablation    . WRIST SURGERY  2009   right wrist     OB History    Gravida  0   Para  0   Term  0   Preterm  0   AB  0   Living  0     SAB  0   TAB  0   Ectopic  0   Multiple  0   Live Births  0           Family History  Problem Relation Age of Onset  . Cancer Mother        multiple myeloma  . Parkinson's disease Mother   . Cancer Father        stomach cancer  . Cancer Brother        pancreatic cancer  . Stroke Maternal Grandmother   . Cancer Maternal Grandfather        lung cancer  . Cancer Paternal Grandmother        ovarian  . Heart disease Paternal Grandfather   . Cancer Brother        multiple myeloma    Social History   Tobacco Use  . Smoking status: Never Smoker  . Smokeless tobacco: Never Used  Substance Use Topics  . Alcohol use:  No    Alcohol/week: 0.0 standard drinks  . Drug use: No    Home Medications Prior to Admission medications   Medication Sig Start Date End Date Taking? Authorizing Provider  cloNIDine (CATAPRES) 0.2 MG tablet TAKE 1 TABLET BY MOUTH THREE TIMES A DAY PATIENT MAY TAKE AN EXTRA TABLET IF BLOOD PRESSURE SPIKES 04/08/19  Yes Wendling, Crosby Oyster, DO  potassium chloride (MICRO-K) 10 MEQ CR capsule TAKE 1 CAPSULE (10 MEQ TOTAL) BY MOUTH 3 (THREE) TIMES DAILY. 01/28/19  Yes Shelda Pal, DO  sertraline (ZOLOFT) 50 MG tablet TAKE 1 TABLET BY MOUTH EVERY DAY 10/08/19  Yes Orma Flaming, MD  EPINEPHrine 0.3 mg/0.3 mL IJ SOAJ injection epinephrine 0.3 mg/0.3 mL injection, auto-injector  INJECT INTO THE SKIN AS DIRECTED PER PACKAGE INSTRUCTIONS FOR ALLERGIC REACTION 01/10/19   [provider]  mometasone (NASONEX) 50 MCG/ACT nasal spray Place 2 sprays into the nose daily as needed (allergies).     [provider]  Multiple Vitamin (MULTIVITAMIN WITH MINERALS) TABS tablet Take 1 tablet by mouth daily.    [provider]  mupirocin ointment  (BACTROBAN) 2 % USE 3 TIMES A DAY TO BIOPSY SITES UNTIL HEALED 09/26/18   [provider]  ondansetron (ZOFRAN) 8 MG tablet ondansetron HCl 8 mg tablet    [provider]  polyethylene glycol (MIRALAX / GLYCOLAX) packet Take 17 g by mouth daily as needed for mild constipation or moderate constipation.     [provider]    Allergies    Fish allergy, Sulfa antibiotics, Doxycycline, and Trazodone and nefazodone  Review of Systems   Review of Systems  Constitutional: Negative for appetite change.  HENT: Negative for congestion.   Respiratory: Negative for cough.   Cardiovascular: Negative for chest pain.  Gastrointestinal: Negative for abdominal pain, nausea and vomiting.  Genitourinary: Negative for flank pain.  Musculoskeletal: Negative for back pain.  Skin: Negative for rash.  Neurological: Positive for syncope.  Psychiatric/Behavioral: The patient is nervous/anxious.     Physical Exam Updated Vital Signs BP 134/83 (BP Location: Right Arm)   Pulse 70   Temp 98.2 F (36.8 C) (Oral)   Resp 16   Ht _0  (1.702 m)   Wt 65.8 kg   LMP  (LMP Unknown)   SpO2 99%   BMI 22.71 kg/m   Physical Exam Vitals and nursing note reviewed.  HENT:     Head: Normocephalic.  Eyes:     Comments: Patient with nystagmus with gaze to left and right.  Cardiovascular:     Rate and Rhythm: Regular rhythm.     Heart sounds: Normal heart sounds.  Pulmonary:     Breath sounds: No wheezing, rhonchi or rales.  Abdominal:     Tenderness: There is no abdominal tenderness.     Hernia: No hernia is present.  Musculoskeletal:        General: No tenderness.     Cervical back: Neck supple.  Skin:    General: Skin is warm.  Neurological:     Mental Status: She is alert and oriented to person, place, and time.  Psychiatric:     Comments: Patient appears somewhat anxious.     ED Results / Procedures / Treatments   Labs (all labs ordered are listed, but only abnormal  results are displayed) Labs Reviewed  COMPREHENSIVE METABOLIC PANEL - Abnormal; Notable for the following components:      Result Value   BUN 24 (*)    All other  components within normal limits  SARS CORONAVIRUS 2 BY RT PCR [facility] ORDER, [facility])  CBC WITH DIFFERENTIAL/PLATELET  TSH  TROPONIN I (HIGH SENSITIVITY)    EKG EKG Interpretation  Date/Time:  Tuesday November 04 2019 10:59:19 EDT Ventricular Rate:  64 PR Interval:    QRS Duration: 146 QT Interval:  456 QTC Calculation: 471 R Axis:   -20 Text Interpretation: Sinus rhythm Left bundle branch block block is new since last tracing 2 years ago Confirmed by Davonna Belling [phone]) on 11/04/2019 11:17:18 AM   Radiology No results found.  Procedures Procedures (including critical care time)  Medications Ordered in ED Medications  lactated ringers bolus 1,000 mL (1,000 mLs Intravenous New Bag/Given 11/04/19 1122)    ED Course  I have reviewed the triage vital signs and the nursing notes.  Pertinent labs & imaging results that were available during my care of the patient were reviewed by me and considered in my medical decision making (see chart for details).    MDM Rules/Calculators/A&P                          Patient presents after syncopal episode.  She has been feeling a little weak over the last few days but no chest pain or trouble breathing.  States there has been some stress taking care of her mother.  However right after breakfast today was going to the bathroom and passed out.  Feeling somewhat better now.  Some mild nystagmus.  BUN mildly elevated with normal creatinine.  Reassuring magnesium especially the fact she has had hypokalemia in the past.  However does have a new left bundle branch block.  Last EKG was 2 to 3 years ago however.  With this new finding however I feel this patient would benefit from admission to the hospital for further evaluation.  Patient requests [facility].  Will discuss with hospitalist.  First troponin is negative. Final Clinical Impression(s) / ED Diagnoses Final diagnoses:  Syncope, unspecified syncope type    Rx / DC Orders ED Discharge Orders    None       Davonna Belling, MD 11/04/19 1210

## 2019-11-04 NOTE — ED Notes (Signed)
ED Provider at bedside. 

## 2019-11-04 NOTE — H&P (Signed)
History and Physical  Beth Frey OZH:[id number] DOB: 1956/04/02 DOA: 11/04/2019   Patient coming from: Home & is able to ambulate   Chief Complaint: Syncope  HPI: Beth Frey is a 64 y.o. female with medical history significant for hypertension, depression, multiple skin cancer (basal and squamous cell), right hemicolectomy with associated chronic hypokalemia presents to the ED after she passed out today on 11/04/2019.  Patient reported after eating breakfast at a restaurant, went to use the bathroom, after urinating, felt lightheaded and sweaty, woke up and found herself on the floor of the bathroom.  No prior episodes before.  Denies hitting her head, but did hit the side of her face and some bruising noted on her knee.  Reported may have passed out for a few seconds and woke up by herself.  No noted incontinence or tongue soreness.  Reports she has been under a lot of stress taking care of her mother in the memory care unit, amongst other things.  Prior to the syncopal episode, patient had just been feeling tired and fatigued, but denied any chest pain, shortness of breath, abdominal pain, nausea/vomiting, fever/chills, dizziness.  Denies any substance abuse, alcohol abuse, ever smoking cigarettes.  Denies any new medication changes or any over-the-counter medications.  Since after the syncopal episode, continues to deny any chest pain shortness of breath, abdominal pain, nausea/vomiting, dysuria, dizziness.  Reports just feeling tired overall.  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   ED Course:  Vital signs were fairly stable, saturating well on room air.  Labs unremarkable, including troponin, EKG showed left bundle branch block, chest x-ray unremarkable.  EDP consulted cardiology Dr. Stanford Breed who recommended admission and will consult in a.m.  Review of Systems: Review of systems are otherwise negative   Past Medical History:    Diagnosis Date  . Anxiety   . Back pain, chronic   . Cancer ([place])    skin  . 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 Osteoarthritis    Past Surgical History:  Procedure Laterality Date  . AUGMENTATION MAMMAPLASTY     Pt has had explants  . BREAST SURGERY  August 2011   Implants removed  . COLON SURGERY  02/20/2010   Right hemicolectomy-cecal bascule  . ELBOW SURGERY     left elbow  . JOINT REPLACEMENT  2006   Left total knee replacement  . OVARIAN CYST REMOVAL     right  . SKIN CANCER EXCISION     multiple-SCC and BCC  . TUBAL LIGATION    . Uterine ablation    . WRIST SURGERY  2009   right wrist    Social History:  reports that she has never smoked. She has never used smokeless tobacco. She reports that she does not drink alcohol and does not use drugs.   Allergies  Allergen Reactions  . Fish Allergy Anaphylaxis  . Sulfa Antibiotics Itching and Swelling    Throat, body swelling  . Doxycycline     Sun   . Trazodone And Nefazodone Other (See Comments)    Serotonin syndrome d/t other medications    Family History  Problem Relation Age of Onset  . Cancer Mother        multiple myeloma  . Parkinson's disease Mother   . Cancer Father        stomach cancer  . Cancer Brother        pancreatic cancer  . Stroke Maternal  Grandmother   . Cancer Maternal Grandfather        lung cancer  . Cancer Paternal Grandmother        ovarian  . Heart disease Paternal Grandfather   . Cancer Brother        multiple myeloma      Prior to Admission medications   Medication Sig Start Date End Date Taking? Authorizing Provider  celecoxib (CELEBREX) 200 MG capsule Take 200 mg by mouth daily.  10/22/19  Yes [provider]  cloNIDine (CATAPRES) 0.2 MG tablet TAKE 1 TABLET BY MOUTH THREE TIMES A DAY PATIENT MAY TAKE AN EXTRA TABLET IF BLOOD PRESSURE SPIKES Patient taking differently: Take 0.1 mg by mouth 2 (two) times daily. PATIENT MAY TAKE AN EXTRA TABLET IF BLOOD PRESSURE SPIKES  04/08/19  Yes Shelda Pal, DO  EPINEPHrine 0.3 mg/0.3 mL IJ SOAJ injection Inject 0.3 mg into the muscle as needed for anaphylaxis.  01/10/19  Yes [provider]  mometasone (NASONEX) 50 MCG/ACT nasal spray Place 2 sprays into the nose daily as needed (allergies).    Yes [provider]  Multiple Vitamin (MULTIVITAMIN WITH MINERALS) TABS tablet Take 1 tablet by mouth daily.   Yes [provider]  mupirocin ointment (BACTROBAN) 2 % Apply 1 application topically daily as needed (rash).  09/26/18  Yes [provider]  ondansetron (ZOFRAN) 8 MG tablet Take 8 mg by mouth every 8 (eight) hours as needed for nausea or vomiting.    Yes [provider]  polyethylene glycol (MIRALAX / GLYCOLAX) packet Take 17 g by mouth daily as needed for mild constipation or moderate constipation.    Yes [provider]  potassium chloride (MICRO-K) 10 MEQ CR capsule TAKE 1 CAPSULE (10 MEQ TOTAL) BY MOUTH 3 (THREE) TIMES DAILY. Patient taking differently: Take 30 mEq by mouth daily.  01/28/19  Yes Shelda Pal, DO  sertraline (ZOLOFT) 50 MG tablet TAKE 1 TABLET BY MOUTH EVERY DAY 10/08/19  Yes Orma Flaming, MD  ALPRAZolam Duanne Moron) 0.5 MG tablet TAKE 1 TABLET BY MOUTH 1 HOUR PRIOR TO PROCEDURE Patient not taking: No sig reported 08/01/19   [provider]  HYDROmorphone (DILAUDID) 2 MG tablet Take 1-2 mg by mouth every 4 (four) hours as needed. Patient not taking: Reported on 11/04/2019 08/01/19   [provider]    Physical Exam: BP (!) 144/86 (BP Location: Right Arm)   Pulse 67   Temp 98 F (36.7 C) (Oral)   Resp 16   Ht 5' 7.5" (1.715 m)   Wt 71.9 kg   LMP  (LMP Unknown)   SpO2 100%   BMI 24.46 kg/m   General: NAD Eyes: Normal ENT: Normal Neck: Supple Cardiovascular: S1, S2 present Respiratory: CTA B Abdomen: Soft, nontender, nondistended, bowel sounds present Skin: Noted healed scars from skin grafts on  BLE Musculoskeletal: No bilateral pedal edema noted Psychiatric: Anxious Neurologic: Normal strength/station in all extremities, no obvious focal neurologic deficits except for noted nystagmus with gaze to left and right          Labs on Admission:  Basic Metabolic Panel: Recent Labs  Lab 11/04/19 1052  NA 137  K 4.0  CL 104  CO2 23  GLUCOSE 91  BUN 24*  CREATININE 0.77  CALCIUM 9.1   Liver Function Tests: Recent Labs  Lab 11/04/19 1052  AST 21  ALT 22  ALKPHOS 48  BILITOT 0.8  PROT 7.1  ALBUMIN 4.1   No results for  input(s): LIPASE, AMYLASE in the last 168 hours. No results for input(s): AMMONIA in the last 168 hours. CBC: Recent Labs  Lab 11/04/19 1052  WBC 7.9  NEUTROABS 3.6  HGB 12.9  HCT 39.7  MCV 88.8  PLT 282   Cardiac Enzymes: No results for input(s): CKTOTAL, CKMB, CKMBINDEX, TROPONINI in the last 168 hours.  BNP (last 3 results) No results for input(s): BNP in the last 8760 hours.  ProBNP (last 3 results) No results for input(s): PROBNP in the last 8760 hours.  CBG: No results for input(s): GLUCAP in the last 168 hours.  Radiological Exams on Admission: DG Chest Port 1 View  Result Date: 11/04/2019 CLINICAL DATA:  Syncope EXAM: PORTABLE CHEST 1 VIEW COMPARISON:  07/01/2018 FINDINGS: Heart and mediastinal contours are within normal limits. No focal opacities or effusions. No acute bony abnormality. IMPRESSION: No active disease. Electronically Signed   By: Rolm Baptise M.D.   On: 11/04/2019 18:14    EKG: Independently reviewed.  Left bundle branch block noted  Assessment/Plan Present on Admission: . Syncope . Skin cancer . Chronic hypokalemia  Principal Problem:   Syncope Active Problems:   Skin cancer   Chronic hypokalemia   Syncope History as above Orthostatic vitals pending Unclear etiology, ?Cardiac causes Vs orthostatic hypotension Vs micturition syncope vs stress related Troponins negative, EKG with left bundle branch  block Chest x-ray unremarkable Echo pending A1c, lipid panel, TSH, free T4 pending UA pending Cardiology consulted, will see patient in a.m. Daily orthostatics Telemetry, monitor closely  Noted nystagmus No other focal neurologic deficits noted Patient reluctant to have CT head for now Consider discussing with neurology  Hypertension Continue home clonidine  Chronic hypokalemia Continue PTA potassium supplements  Depression Continue Zoloft       DVT prophylaxis: Lovenox  Code Status: Full  Family Communication: Discussed extensively with patient  Disposition Plan: Likely DC home once work-up complete  Consults called: Cardiology  Admission status: Observation, telemetry    Alma Friendly MD [facility]  If 7PM-7AM, please contact night-coverage [email or web address]   11/04/2019, 7:39 PM

## 2019-11-04 NOTE — ED Triage Notes (Signed)
Awoke this am, eating breakfast, was voiding, felt very hot, awoke lying on floor in a bathroom stall. Husband states he did not visualize any seizure type activity. Very new experience, not hx of syncopal episodes. Denies any cardiac hx

## 2019-11-04 NOTE — ED Notes (Signed)
Attempted to call report to rec unit at [facility] [place] unavailable, states will call back, number provided for this RN

## 2019-11-05 ENCOUNTER — Observation Stay (HOSPITAL_BASED_OUTPATIENT_CLINIC_OR_DEPARTMENT_OTHER): Payer: BC Managed Care – PPO

## 2019-11-05 ENCOUNTER — Other Ambulatory Visit: Payer: Self-pay | Admitting: Medical

## 2019-11-05 DIAGNOSIS — Z79899 Other long term (current) drug therapy: Secondary | ICD-10-CM | POA: Diagnosis not present

## 2019-11-05 DIAGNOSIS — R9431 Abnormal electrocardiogram [ECG] [EKG]: Secondary | ICD-10-CM | POA: Diagnosis not present

## 2019-11-05 DIAGNOSIS — F419 Anxiety disorder, unspecified: Secondary | ICD-10-CM | POA: Diagnosis not present

## 2019-11-05 DIAGNOSIS — H5500 Unspecified nystagmus: Secondary | ICD-10-CM | POA: Diagnosis not present

## 2019-11-05 DIAGNOSIS — I447 Left bundle-branch block, unspecified: Secondary | ICD-10-CM | POA: Diagnosis not present

## 2019-11-05 DIAGNOSIS — R55 Syncope and collapse: Secondary | ICD-10-CM

## 2019-11-05 DIAGNOSIS — E876 Hypokalemia: Secondary | ICD-10-CM

## 2019-11-05 DIAGNOSIS — Z791 Long term (current) use of non-steroidal anti-inflammatories (NSAID): Secondary | ICD-10-CM | POA: Diagnosis not present

## 2019-11-05 DIAGNOSIS — M1990 Unspecified osteoarthritis, unspecified site: Secondary | ICD-10-CM | POA: Diagnosis not present

## 2019-11-05 DIAGNOSIS — Z9049 Acquired absence of other specified parts of digestive tract: Secondary | ICD-10-CM | POA: Diagnosis not present

## 2019-11-05 DIAGNOSIS — C4490 Unspecified malignant neoplasm of skin, unspecified: Secondary | ICD-10-CM

## 2019-11-05 DIAGNOSIS — I10 Essential (primary) hypertension: Secondary | ICD-10-CM

## 2019-11-05 DIAGNOSIS — F329 Major depressive disorder, single episode, unspecified: Secondary | ICD-10-CM | POA: Diagnosis not present

## 2019-11-05 DIAGNOSIS — Z96652 Presence of left artificial knee joint: Secondary | ICD-10-CM | POA: Diagnosis not present

## 2019-11-05 DIAGNOSIS — S8000XA Contusion of unspecified knee, initial encounter: Secondary | ICD-10-CM | POA: Diagnosis not present

## 2019-11-05 DIAGNOSIS — Z20822 Contact with and (suspected) exposure to covid-19: Secondary | ICD-10-CM | POA: Diagnosis not present

## 2019-11-05 DIAGNOSIS — Z85828 Personal history of other malignant neoplasm of skin: Secondary | ICD-10-CM | POA: Diagnosis not present

## 2019-11-05 DIAGNOSIS — E785 Hyperlipidemia, unspecified: Secondary | ICD-10-CM | POA: Diagnosis not present

## 2019-11-05 LAB — CBC
HCT: 37.9 % (ref 36.0–46.0)
Hemoglobin: 12.4 g/dL (ref 12.0–15.0)
MCH: 28.6 pg (ref 26.0–34.0)
MCHC: 32.7 g/dL (ref 30.0–36.0)
MCV: 87.5 fL (ref 80.0–100.0)
Platelets: 262 10*3/uL (ref 150–400)
RBC: 4.33 MIL/uL (ref 3.87–5.11)
RDW: 13 % (ref 11.5–15.5)
WBC: 6.7 10*3/uL (ref 4.0–10.5)
nRBC: 0 % (ref 0.0–0.2)

## 2019-11-05 LAB — COMPREHENSIVE METABOLIC PANEL
ALT: 22 U/L (ref 0–44)
AST: 21 U/L (ref 15–41)
Albumin: 4.1 g/dL (ref 3.5–5.0)
Alkaline Phosphatase: 47 U/L (ref 38–126)
Anion gap: 9 (ref 5–15)
BUN: 15 mg/dL (ref 8–23)
CO2: 24 mmol/L (ref 22–32)
Calcium: 9.2 mg/dL (ref 8.9–10.3)
Chloride: 109 mmol/L (ref 98–111)
Creatinine, Ser: 0.64 mg/dL (ref 0.44–1.00)
GFR calc Af Amer: >60 mL/min (ref >60)
GFR calc non Af Amer: >60 mL/min (ref >60)
Glucose, Bld: 93 mg/dL (ref 70–99)
Potassium: 3.9 mmol/L (ref 3.5–5.1)
Sodium: 142 mmol/L (ref 135–145)
Total Bilirubin: 0.6 mg/dL (ref 0.3–1.2)
Total Protein: 6.5 g/dL (ref 6.5–8.1)

## 2019-11-05 LAB — LIPID PANEL
Cholesterol: 259 mg/dL — ABNORMAL HIGH (ref 0–200)
HDL: 52 mg/dL (ref >40)
LDL Cholesterol: 157 mg/dL — ABNORMAL HIGH (ref 0–99)
Total CHOL/HDL Ratio: 5 RATIO
Triglycerides: 252 mg/dL — ABNORMAL HIGH (ref <150)
VLDL: 50 mg/dL — ABNORMAL HIGH (ref 0–40)

## 2019-11-05 LAB — T4, FREE: Free T4: 0.92 ng/dL (ref 0.61–1.12)

## 2019-11-05 LAB — HIV ANTIBODY (ROUTINE TESTING W REFLEX): HIV Screen 4th Generation wRfx: NONREACTIVE

## 2019-11-05 LAB — TSH: TSH: 3.33 u[IU]/mL (ref 0.350–4.500)

## 2019-11-05 LAB — ECHOCARDIOGRAM COMPLETE
Height: 67.5 in
Weight: 2536.17 oz

## 2019-11-05 MED ORDER — AMLODIPINE BESYLATE 5 MG PO TABS
5.0000 mg | ORAL_TABLET | Freq: Every day | ORAL | Status: DC
  Administered 2019-11-05: 5 mg via ORAL
  Filled 2019-11-05: qty 1

## 2019-11-05 MED ORDER — ATORVASTATIN CALCIUM 40 MG PO TABS: 40.0000 mg | ORAL_TABLET | Freq: Every day | ORAL | Status: DC

## 2019-11-05 MED ORDER — AMLODIPINE BESYLATE 5 MG PO TABS: 5.0000 mg | ORAL_TABLET | Freq: Every day | ORAL | 0 refills | Status: DC

## 2019-11-05 MED ORDER — ATORVASTATIN CALCIUM 40 MG PO TABS: 40.0000 mg | ORAL_TABLET | Freq: Every day | ORAL | 0 refills | Status: DC

## 2019-11-05 MED ORDER — CLONIDINE HCL 0.1 MG PO TABS: 0.1000 mg | ORAL_TABLET | Freq: Every day | ORAL | Status: DC

## 2019-11-05 MED ORDER — CLONIDINE HCL 0.2 MG PO TABS: 0.1000 mg | ORAL_TABLET | Freq: Every day | ORAL | 0 refills | Status: DC

## 2019-11-05 NOTE — Consult Note (Signed)
Cardiology Consultation:   Patient ID: Beth Frey; [id number]; 1955/09/27   Admit date: 11/04/2019 Date of Consult: 11/05/2019  Primary Care Provider: Orma Flaming, MD Primary Cardiologist: New to [place]; Dr. Harrell Gave Primary Electrophysiologist:  None  Patient Profile:   Beth Frey is a 64 y.o. female with a PMH of HTN, cecal bascule managed with right hemicolectomy c/b chronic hypokalemia, BCC and SCC s/p multiple excisions, and depression, who is being seen today for the evaluation of syncope at the request of Dr. Alvino Chapel.  History of Present Illness:   Beth Frey was in her usual state of health until 11/04/19 when she experienced a syncopal episode. She reports she ate breakfast at a restaurant that morning. She went to the restroom and after urinating felt lightheaded and diaphoretic, then woke up on the bathroom floor. She hit her face and bruised her knee in the fall. She thinks she may have lost consciousness for a few seconds. No complaints of chest pain, SOB, or palpitations in the events surrounding her syncope. She does not increased stress recently with caring for her mother in a memory care unit and has felt increased fatigue recently. She presented to [facility] for initial evaluation and was transferred to [facility] for admission.  She has no prior cardiac history aside from HTN. No prior ischemic evaluation or echocardiograms. She was noted to have aortic atherosclerosis on prior CT scans. No history of ETOH, illicit drug, or tobacco abuse. No significant family history of CAD in first degree relatives but paternal uncle died from MI in 18s-60s.  At the time of this evaluation she feels back to baseline. She denies prior episodes of syncope. She is fairly active at baseline and walks regularly including routes with hills. She denies any exertional chest pain, chest pain at rest, SOB, or DOE. She denies issues with orthopnea, PND, or LE edema. She reports being on clonidine  for years now and has been working with Dr. Rogers Blocker (PCP) to wean off. Currently taking 0.69m BID. She reports frequent spikes in BP in the evenings at which time she feels flushed and turns red. She has never tried any other antihypertensive medications.   Hospital course: intermittently hypertensive, otherwise VSS. Orthostatic vitals are negative. Labs notable for electrolytes wnl, Cr 0.77, CBC wnl, HsTrop 4>5, Tcholesterol 259, HDL 52, LDL 157, Triglycerides 252, TSH/FT4 wnl, UA negative. EKG showed sinus rhythm with rate 64 bpm, LBBB (new compared to last EKG in 2019). CXR without acute findings. Echo pending. Cardiology asked to evaluate for syncope.   Past Medical History:  Diagnosis Date  . Anxiety   . Back pain, chronic   . Cancer ([place]    skin  . Cecal bascule ([place]   . Osteoarthritis     Past Surgical History:  Procedure Laterality Date  . AUGMENTATION MAMMAPLASTY     Pt has had explants  . BREAST SURGERY  August 2011   Implants removed  . COLON SURGERY  02/20/2010   Right hemicolectomy-cecal bascule  . ELBOW SURGERY     left elbow  . JOINT REPLACEMENT  2006   Left total knee replacement  . OVARIAN CYST REMOVAL     right  . SKIN CANCER EXCISION     multiple-SCC and BCC  . TUBAL LIGATION    . Uterine ablation    . WRIST SURGERY  2009   right wrist     Home Medications:  Prior to Admission medications   Medication Sig Start Date End Date Taking?  Authorizing Provider  celecoxib (CELEBREX) 200 MG capsule Take 200 mg by mouth daily.  10/22/19  Yes [provider]  cloNIDine (CATAPRES) 0.2 MG tablet TAKE 1 TABLET BY MOUTH THREE TIMES A DAY PATIENT MAY TAKE AN EXTRA TABLET IF BLOOD PRESSURE SPIKES Patient taking differently: Take 0.1 mg by mouth 2 (two) times daily. PATIENT MAY TAKE AN EXTRA TABLET IF BLOOD PRESSURE SPIKES 04/08/19  Yes Shelda Pal, DO  EPINEPHrine 0.3 mg/0.3 mL IJ SOAJ injection Inject 0.3 mg into the muscle as needed for anaphylaxis.   01/10/19  Yes [provider]  mometasone (NASONEX) 50 MCG/ACT nasal spray Place 2 sprays into the nose daily as needed (allergies).    Yes [provider]  Multiple Vitamin (MULTIVITAMIN WITH MINERALS) TABS tablet Take 1 tablet by mouth daily.   Yes [provider]  mupirocin ointment (BACTROBAN) 2 % Apply 1 application topically daily as needed (rash).  09/26/18  Yes [provider]  ondansetron (ZOFRAN) 8 MG tablet Take 8 mg by mouth every 8 (eight) hours as needed for nausea or vomiting.    Yes [provider]  polyethylene glycol (MIRALAX / GLYCOLAX) packet Take 17 g by mouth daily as needed for mild constipation or moderate constipation.    Yes [provider]  potassium chloride (MICRO-K) 10 MEQ CR capsule TAKE 1 CAPSULE (10 MEQ TOTAL) BY MOUTH 3 (THREE) TIMES DAILY. Patient taking differently: Take 30 mEq by mouth daily.  01/28/19  Yes Shelda Pal, DO  sertraline (ZOLOFT) 50 MG tablet TAKE 1 TABLET BY MOUTH EVERY DAY 10/08/19  Yes Orma Flaming, MD  ALPRAZolam Duanne Moron) 0.5 MG tablet TAKE 1 TABLET BY MOUTH 1 HOUR PRIOR TO PROCEDURE Patient not taking: No sig reported 08/01/19   [provider]  HYDROmorphone (DILAUDID) 2 MG tablet Take 1-2 mg by mouth every 4 (four) hours as needed. Patient not taking: Reported on 11/04/2019 08/01/19   [provider]    Inpatient Medications: Scheduled Meds: . aspirin EC  81 mg Oral Daily  . cloNIDine  0.1 mg Oral BID  . enoxaparin (LOVENOX) injection  40 mg Subcutaneous Q24H  . potassium chloride  30 mEq Oral Daily  . sertraline  50 mg Oral Daily  . sodium chloride flush  3 mL Intravenous Q12H   Continuous Infusions:  PRN Meds: acetaminophen **OR** acetaminophen, albuterol, HYDROcodone-acetaminophen, ondansetron **OR** ondansetron (ZOFRAN) IV, polyethylene glycol  Allergies:    Allergies  Allergen Reactions  . Fish Allergy Anaphylaxis  . Sulfa Antibiotics Itching and  Swelling    Throat, body swelling  . Doxycycline     Sun   . Trazodone And Nefazodone Other (See Comments)    Serotonin syndrome d/t other medications    Social History:   Social History   Socioeconomic History  . Marital status: Married    Spouse name: Not on file  . Number of children: Not on file  . Years of education: Not on file  . Highest education level: Not on file  Occupational History  . Not on file  Tobacco Use  . Smoking status: Never Smoker  . Smokeless tobacco: Never Used  Substance and Sexual Activity  . Alcohol use: No    Alcohol/week: 0.0 standard drinks  . Drug use: No  . Sexual activity: Not on file    Comment: married  Other Topics Concern  . Not on file  Social History Narrative  . Not on file   Social Determinants of Health  Financial Resource Strain:   . Difficulty of Paying Living Expenses:   Food Insecurity:   . Worried About Charity fundraiser in the Last Year:   . Arboriculturist in the Last Year:   Transportation Needs:   . Film/video editor (Medical):   Marland Kitchen Lack of Transportation (Non-Medical):   Physical Activity:   . Days of Exercise per Week:   . Minutes of Exercise per Session:   Stress:   . Feeling of Stress :   Social Connections:   . Frequency of Communication with Friends and Family:   . Frequency of Social Gatherings with Friends and Family:   . Attends Religious Services:   . Active Member of Clubs or Organizations:   . Attends Archivist Meetings:   Marland Kitchen Marital Status:   Intimate Partner Violence:   . Fear of Current or Ex-Partner:   . Emotionally Abused:   Marland Kitchen Physically Abused:   . Sexually Abused:     Family History:    Family History  Problem Relation Age of Onset  . Cancer Mother        multiple myeloma  . Parkinson's disease Mother   . Cancer Father        stomach cancer  . Cancer Brother        pancreatic cancer  . Stroke Maternal Grandmother   . Cancer Maternal Grandfather        lung  cancer  . Cancer Paternal Grandmother        ovarian  . Heart disease Paternal Grandfather   . Cancer Brother        multiple myeloma     ROS:  Please see the history of present illness.  ROS  All other ROS reviewed and negative.     Physical Exam/Data:   Vitals:   11/04/19 1702 11/04/19 2050 11/05/19 0103 11/05/19 0603  BP:  (!) 178/83 115/71 (!) 131/92  Pulse:  67 65 74  Resp:  _0 Temp:  98.1 F (36.7 C) 97.7 F (36.5 C) 98.1 F (36.7 C)  TempSrc:  Oral Oral Oral  SpO2:  100% 99% 99%  Weight: 71.9 kg     Height: 5' 7.5" (1.715 m)       Intake/Output Summary (Last 24 hours) at 11/05/2019 0821 Last data filed at 11/05/2019 0429 Gross per 24 hour  Intake 1002.42 ml  Output 950 ml  Net 52.42 ml   Filed Weights   11/04/19 1038 11/04/19 1702  Weight: 65.8 kg 71.9 kg   Body mass index is 24.46 kg/m.  General:  Well nourished, well developed, in no acute distress HEENT: sclera anicteric  Neck: no JVD Vascular: No carotid bruits; distal pulses 2+ bilaterally Cardiac:  normal S1, S2; RRR; no murmurs, rubs, or gallops Lungs:  clear to auscultation bilaterally, no wheezing, rhonchi or rales  Abd: NABS, soft, nontender, no hepatomegaly Ext: no edema Musculoskeletal:  No deformities, BUE and BLE strength normal and equal Skin: warm and dry, multiple areas of scarring from skin cancer excision procedures.  Neuro:  CNs 2-12 intact, no focal abnormalities noted Psych:  anxious   EKG:  The EKG was personally reviewed and demonstrates:  sinus rhythm with rate 64 bpm, LBBB (new compared to last EKG in 2019) Telemetry:  Telemetry was personally reviewed and demonstrates: sinus rhythm with LBBB.   Relevant CV Studies: Echo pending.  Laboratory Data:  Chemistry Recent Labs  Lab 11/04/19 1052 11/05/19 0354  NA  137 142  K 4.0 3.9  CL 104 109  CO2 23 24  GLUCOSE 91 93  BUN 24* 15  CREATININE 0.77 0.64  CALCIUM 9.1 9.2  GFRNONAA >60 >60  GFRAA >60 >60    ANIONGAP 10 9    Recent Labs  Lab 11/04/19 1052 11/05/19 0354  PROT 7.1 6.5  ALBUMIN 4.1 4.1  AST 21 21  ALT 22 22  ALKPHOS 48 47  BILITOT 0.8 0.6   Hematology Recent Labs  Lab 11/04/19 1052 11/05/19 0354  WBC 7.9 6.7  RBC 4.47 4.33  HGB 12.9 12.4  HCT 39.7 37.9  MCV 88.8 87.5  MCH 28.9 28.6  MCHC 32.5 32.7  RDW 13.0 13.0  PLT 282 262   Cardiac EnzymesNo results for input(s): TROPONINI in the last 168 hours. No results for input(s): TROPIPOC in the last 168 hours.  BNPNo results for input(s): BNP, PROBNP in the last 168 hours.  DDimer No results for input(s): DDIMER in the last 168 hours.  Radiology/Studies:  DG Chest Port 1 View  Result Date: 11/04/2019 CLINICAL DATA:  Syncope EXAM: PORTABLE CHEST 1 VIEW COMPARISON:  07/01/2018 FINDINGS: Heart and mediastinal contours are within normal limits. No focal opacities or effusions. No acute bony abnormality. IMPRESSION: No active disease. Electronically Signed   By: Rolm Baptise M.D.   On: 11/04/2019 18:14    Assessment and Plan:   1. Syncope: patient presented with syncopal episode 11/04/19. Felt lightheaded and diaphoretic after urinating, then lost consciousness for a few seconds and awoke on the floor of the bathroom. No CP, SOB, or palpitations in the events surrounding her syncope. Orthostatics negative this admission. HsTrop negative x2. EKG shows sinus rhythm with LBBB (new from 2019). No arrhythmias on telemetry. Suspect micturition syncope.  - Echo pending to assess LV function and valvular function.  - Will plan for an outpatient cardiac monitor to further evaluate for arrhythmic cause of syncope.  - Will wean off clonidine as below.  2. LBBB: new on EKG this admission, though last comparison was from 2019. No anginal complaints. No prior ischemic evaluation -  Await echo results - if LV function is normal, no further cardiac work-up at this time.   3. HTN: BP intermittently elevated this admission. She is on  clonidine 0.43m BID at home with additional dose if needed for elevated BP. In light of recent syncope, favor transitioning off clonidine in lieu of an alternative antihypertensive medication - Wean clonidine to 0.169mdaily x3 days, then stop - Will start amlodipine 41m38maily - uptitrate as needed.   4. HLD: LDL 157 and triglycerides 252 this admission. Not on any cholesterol medications - Favor starting atorvastatin 12m79mily for risk factor modifications to prevent CAD.   5. Chronic hypokalemia following right hemicolectomy: K stable this admission - Continue home po supplement   For questions or updates, please contact [facility] consult [email or web address] for contact info under Cardiology/STEMI.   Signed, KrisAbigail Butts-C  11/05/2019 8:21 AM [phone]

## 2019-11-05 NOTE — Addendum Note (Signed)
Addended by: Roby Lofts on: 11/05/2019 01:16 PM   Modules accepted: Orders

## 2019-11-05 NOTE — Progress Notes (Signed)
  Echocardiogram 2D Echocardiogram has been performed.  Jennette Dubin 11/05/2019, 9:00 AM

## 2019-11-05 NOTE — Discharge Instructions (Signed)
Our office will contact you directly to coordinate your outpatient heart monitor. They will mail a monitor to your house for you to wear for 2 weeks, then mail back in. If you do not hear from them in them by Friday, please contact our office to follow-up. [phone]

## 2019-11-05 NOTE — Discharge Summary (Signed)
Physician Discharge Summary  Beth Frey GYF:[id number] DOB: 1956/04/01 DOA: 11/04/2019  PCP: Orma Flaming, MD  Admit date: 11/04/2019 Discharge date: 11/05/2019  Admitted From: Home Disposition: Home   Recommendations for Outpatient Follow-up:  1. Follow up with PCP in 1-2 weeks, monitor BP. Started norvasc and planned taper of clonidine to avoid labile BPs. 2. Monitor lipid profile. Atorvastatin 40mg  started during admission.  3. Follow up with cardiology, planning zio patch.   Home Health: None Equipment/Devices: None Discharge Condition: Stable CODE STATUS: Full Diet recommendation: Heart healthy  Brief/Interim Summary: Beth Frey is a 64 y.o. female with a history of HTN and right hemicolectomy with associated chronic hypokalemia who presented to the ED 6/29 after passing out. She was urinating in a bathroom during breakfast, felt lightheaded momentarily and woke up on the floor having fallen forward and losing consciousness briefly. Did not have postictal symptoms and no palpitations. In the ED, vital signs were stable. Labs including troponin reassuring. CXR unremarkable, though ECG demonstrated LBBB not seen prior. Cardiology was consulted, recommended observation on telemetry. Echocardiogram performed the next day revealed no significant structural heart disease and telemetry has not revealed any significant arrhythmias. Cardiology evaluated the patient, making medication recommendations and cleared for discharge with plans for outpatient cardiac monitoring.   Discharge Diagnoses:  Principal Problem:   Syncope Active Problems:   Skin cancer   Chronic hypokalemia  Syncope: Likely micturition syncope, possibly related to labile blood pressures. No red flag features during observation here.  - Cardiac monitor to be arranged and followed up as outpatient by cardiology.  - BP management as below - Return precautions reviewed.   LBBB: New since 2019 without anginal complaints  or troponin elevation of WMA on echo.  - Defer ischemic work up to outpatient setting.    HTN: In light of recent syncope, cardiology recommends tapering clonidine and starting norvasc. They discussed this with the patient who agrees. Could consider addition of chlorthalidone if BP elevated and no further syncopal events.   Hyperlipidemia: Recommend starting lipitor 40mg  w/LDL 157.   Chronic hypokalemia: Stable this admission.   History of right hemicolectomy   Discharge Instructions Discharge Instructions    Diet - low sodium heart healthy   Complete by: As directed    Discharge instructions   Complete by: As directed    You were evaluated for syncope that may have been micturition syncope or due to a cardiac arrhythmia. No significant abnormal heart rhythms were noted on cardiac monitoring while you were here, and work up was otherwise very reassuring. Though you do have a left bundle branch block on ECG, there were no regional wall motion abnormalities on the echocardiogram and systolic function is normal. Cardiology was recommended discharge and will follow up with you as an outpatient to arrange cardiac monitoring. They also recommend:  - Changing clonidine to norvasc. Norvasc was sent to your pharmacy to be started tomorrow. You should take clonidine 0.1mg  once daily for the next 3 days to taper off.  - Adding atorvastatin 40mg  due to elevated cholesterol. This was also sent to your pharmacy.  - Return for evaluation if you experience another episode, otherwise, follow up as above with cardiology and with your PCP.   Increase activity slowly   Complete by: As directed      Allergies as of 11/05/2019      Reactions   Fish Allergy Anaphylaxis   Sulfa Antibiotics Itching, Swelling   Throat, body swelling   Doxycycline  Sun    Trazodone And Nefazodone Other (See Comments)   Serotonin syndrome d/t other medications      Medication List    STOP taking these medications    ALPRAZolam 0.5 MG tablet Commonly known as: XANAX   HYDROmorphone 2 MG tablet Commonly known as: DILAUDID     TAKE these medications   amLODipine 5 MG tablet Commonly known as: NORVASC Take 1 tablet (5 mg total) by mouth daily.   atorvastatin 40 MG tablet Commonly known as: LIPITOR Take 1 tablet (40 mg total) by mouth at bedtime.   celecoxib 200 MG capsule Commonly known as: CELEBREX Take 200 mg by mouth daily.   cloNIDine 0.2 MG tablet Commonly known as: CATAPRES Take 0.5 tablets (0.1 mg total) by mouth daily for 3 days. What changed: See the new instructions.   EPINEPHrine 0.3 mg/0.3 mL Soaj injection Commonly known as: EPI-PEN Inject 0.3 mg into the muscle as needed for anaphylaxis.   mometasone 50 MCG/ACT nasal spray Commonly known as: NASONEX Place 2 sprays into the nose daily as needed (allergies).   multivitamin with minerals Tabs tablet Take 1 tablet by mouth daily.   mupirocin ointment 2 % Commonly known as: BACTROBAN Apply 1 application topically daily as needed (rash).   ondansetron 8 MG tablet Commonly known as: ZOFRAN Take 8 mg by mouth every 8 (eight) hours as needed for nausea or vomiting.   polyethylene glycol 17 g packet Commonly known as: MIRALAX / GLYCOLAX Take 17 g by mouth daily as needed for mild constipation or moderate constipation.   potassium chloride 10 MEQ CR capsule Commonly known as: MICRO-K TAKE 1 CAPSULE (10 MEQ TOTAL) BY MOUTH 3 (THREE) TIMES DAILY. What changed:   how much to take  when to take this   sertraline 50 MG tablet Commonly known as: ZOLOFT TAKE 1 TABLET BY MOUTH EVERY DAY       Follow-up Information    Orma Flaming, MD. Schedule an appointment as soon as possible for a visit.   Specialty: Family Medicine Contact information: [place] [address] [phone]        Buford Dresser, MD Follow up.   Specialty: Cardiology Contact information: [address] [place]  [place] [address] [phone]              Allergies  Allergen Reactions  . Fish Allergy Anaphylaxis  . Sulfa Antibiotics Itching and Swelling    Throat, body swelling  . Doxycycline     Sun   . Trazodone And Nefazodone Other (See Comments)    Serotonin syndrome d/t other medications    Consultations:  Cardiology, Dr. Buford Dresser  Procedures/Studies: DG Chest Port 1 View  Result Date: 11/04/2019 CLINICAL DATA:  Syncope EXAM: PORTABLE CHEST 1 VIEW COMPARISON:  07/01/2018 FINDINGS: Heart and mediastinal contours are within normal limits. No focal opacities or effusions. No acute bony abnormality. IMPRESSION: No active disease. Electronically Signed   By: Rolm Baptise M.D.   On: 11/04/2019 18:14   ECHOCARDIOGRAM COMPLETE  Result Date: 11/05/2019    ECHOCARDIOGRAM REPORT   Patient Name:   TEEA DUCEY Date of Exam: 11/05/2019 Medical Rec #:  [id number]   Height:       67.5 in Accession #:    [id number]  Weight:       158.5 lb Date of Birth:  1955-06-25   BSA:          1.842 m Patient Age:    55 years  BP:           131/92 mmHg Patient Gender: F           HR:           74 bpm. Exam Location:  Inpatient Procedure: 2D Echo Indications:    Abnormal ECG R94.31  History:        Patient has no prior history of Echocardiogram examinations.  Sonographer:    Mikki Santee RDCS (AE) Referring Phys: [id number] [place]  1. Left ventricular ejection fraction, by estimation, is 60 to 65%. The left ventricle has normal function. The left ventricle has no regional wall motion abnormalities. Left ventricular diastolic parameters are consistent with Grade I diastolic dysfunction (impaired relaxation).  2. Right ventricular systolic function is normal. The right ventricular size is normal. There is normal pulmonary artery systolic pressure.  3. Left atrial size was mildly dilated.  4. The mitral valve is normal in structure. No evidence of mitral valve regurgitation.  No evidence of mitral stenosis.  5. The aortic valve is normal in structure. Aortic valve regurgitation is not visualized. No aortic stenosis is present.  6. The inferior vena cava is dilated in size with >50% respiratory variability, suggesting right atrial pressure of 8 mmHg. FINDINGS  Left Ventricle: Left ventricular ejection fraction, by estimation, is 60 to 65%. The left ventricle has normal function. The left ventricle has no regional wall motion abnormalities. The left ventricular internal cavity size was normal in size. There is  no left ventricular hypertrophy. Left ventricular diastolic parameters are consistent with Grade I diastolic dysfunction (impaired relaxation). Indeterminate filling pressures. Right Ventricle: The right ventricular size is normal. No increase in right ventricular wall thickness. Right ventricular systolic function is normal. There is normal pulmonary artery systolic pressure. The tricuspid regurgitant velocity is 2.36 m/s, and  with an assumed right atrial pressure of 8 mmHg, the estimated right ventricular systolic pressure is 96.7 mmHg. Left Atrium: Left atrial size was mildly dilated. Right Atrium: Right atrial size was normal in size. Pericardium: A small pericardial effusion is present. Mitral Valve: The mitral valve is normal in structure. Normal mobility of the mitral valve leaflets. No evidence of mitral valve regurgitation. No evidence of mitral valve stenosis. Tricuspid Valve: The tricuspid valve is normal in structure. Tricuspid valve regurgitation is mild . No evidence of tricuspid stenosis. Aortic Valve: The aortic valve is normal in structure. Aortic valve regurgitation is not visualized. No aortic stenosis is present. Pulmonic Valve: The pulmonic valve was normal in structure. Pulmonic valve regurgitation is not visualized. No evidence of pulmonic stenosis. Aorta: The aortic root is normal in size and structure. Venous: The inferior vena cava is dilated in size with  greater than 50% respiratory variability, suggesting right atrial pressure of 8 mmHg. IAS/Shunts: No atrial level shunt detected by color flow Doppler.  LEFT VENTRICLE PLAX 2D LVIDd:         4.50 cm  Diastology LVIDs:         2.90 cm  LV e' lateral:   6.05 cm/s LV PW:         0.80 cm  LV E/e' lateral: 11.1 LV IVS:        1.00 cm  LV e' medial:    5.08 cm/s LVOT diam:     2.10 cm  LV E/e' medial:  13.2 LV SV:         84 LV SV Index:   46 LVOT Area:  3.46 cm  RIGHT VENTRICLE RV S prime:     9.55 cm/s TAPSE (M-mode): 2.2 cm LEFT ATRIUM           Index       RIGHT ATRIUM           Index LA diam:      3.30 cm 1.79 cm/m  RA Area:     13.20 cm LA Vol (A2C): 57.0 ml 30.94 ml/m RA Volume:   26.30 ml  14.28 ml/m LA Vol (A4C): 23.9 ml 12.97 ml/m  AORTIC VALVE LVOT Vmax:   102.00 cm/s LVOT Vmean:  69.400 cm/s LVOT VTI:    0.243 m  AORTA Ao Root diam: 2.65 cm MITRAL VALVE               TRICUSPID VALVE MV Area (PHT): 2.56 cm    TR Peak grad:   22.3 mmHg MV Decel Time: 296 msec    TR Vmax:        236.00 cm/s MV E velocity: 67.30 cm/s MV A velocity: 71.60 cm/s  SHUNTS MV E/A ratio:  0.94        Systemic VTI:  0.24 m                            Systemic Diam: 2.10 cm Skeet Latch MD Electronically signed by Skeet Latch MD Signature Date/Time: 11/05/2019/11:34:19 AM    Final       Subjective: No recurrent symptoms. No chest pain or dyspnea currently.  Discharge Exam: Vitals:   11/05/19 0603 11/05/19 0907  BP: (!) 131/92 (!) 154/85  Pulse: 74   Resp: 20   Temp: 98.1 F (36.7 C)   SpO2: 99%    General: Pt is alert, awake, not in acute distress Cardiovascular: RRR, S1/S2 +, no rubs, no gallops Respiratory: CTA bilaterally, no wheezing, no rhonchi Abdominal: Soft, NT, ND, bowel sounds + Extremities: No edema, no cyanosis  Labs: BNP (last 3 results) No results for input(s): BNP in the last 8760 hours. Basic Metabolic Panel: Recent Labs  Lab 11/04/19 1052 11/05/19 0354  NA 137 142  K 4.0  3.9  CL 104 109  CO2 23 24  GLUCOSE 91 93  BUN 24* 15  CREATININE 0.77 0.64  CALCIUM 9.1 9.2   Liver Function Tests: Recent Labs  Lab 11/04/19 1052 11/05/19 0354  AST 21 21  ALT 22 22  ALKPHOS 48 47  BILITOT 0.8 0.6  PROT 7.1 6.5  ALBUMIN 4.1 4.1   No results for input(s): LIPASE, AMYLASE in the last 168 hours. No results for input(s): AMMONIA in the last 168 hours. CBC: Recent Labs  Lab 11/04/19 1052 11/05/19 0354  WBC 7.9 6.7  NEUTROABS 3.6  --   HGB 12.9 12.4  HCT 39.7 37.9  MCV 88.8 87.5  PLT 282 262   Cardiac Enzymes: No results for input(s): CKTOTAL, CKMB, CKMBINDEX, TROPONINI in the last 168 hours. BNP: Invalid input(s): POCBNP CBG: No results for input(s): GLUCAP in the last 168 hours. D-Dimer No results for input(s): DDIMER in the last 72 hours. Hgb A1c No results for input(s): HGBA1C in the last 72 hours. Lipid Profile Recent Labs    11/05/19 0354  CHOL 259*  HDL 52  LDLCALC 157*  TRIG 252*  CHOLHDL 5.0   Thyroid function studies Recent Labs    11/05/19 0354  TSH 3.330   Anemia work up No results for input(s): VITAMINB12, FOLATE, FERRITIN,  TIBC, IRON, RETICCTPCT in the last 72 hours. Urinalysis    Component Value Date/Time   COLORURINE YELLOW 11/04/2019 2100   APPEARANCEUR CLEAR 11/04/2019 2100   LABSPEC 1.004 (L) 11/04/2019 2100   PHURINE 6.0 11/04/2019 2100   GLUCOSEU NEGATIVE 11/04/2019 2100   HGBUR NEGATIVE 11/04/2019 2100   BILIRUBINUR NEGATIVE 11/04/2019 2100   BILIRUBINUR moderate (A) 09/24/2015 0915   BILIRUBINUR neg 08/22/2011 1436   KETONESUR NEGATIVE 11/04/2019 2100   PROTEINUR NEGATIVE 11/04/2019 2100   UROBILINOGEN 0.2 09/24/2015 0915   UROBILINOGEN 0.2 11/26/2010 1642   NITRITE NEGATIVE 11/04/2019 2100   LEUKOCYTESUR NEGATIVE 11/04/2019 2100    Microbiology Recent Results (from the past 240 hour(s))  SARS Coronavirus 2 by RT PCR (hospital order, performed in [facility] hospital lab) Nasopharyngeal  Nasopharyngeal Swab     Status: None   Collection Time: 11/04/19 12:02 PM   Specimen: Nasopharyngeal Swab  Result Value Ref Range Status   SARS Coronavirus 2 NEGATIVE NEGATIVE Final    Comment: (NOTE) SARS-CoV-2 target nucleic acids are NOT DETECTED.  The SARS-CoV-2 RNA is generally detectable in upper and lower respiratory specimens during the acute phase of infection. The lowest concentration of SARS-CoV-2 viral copies this assay can detect is 250 copies / mL. A negative result does not preclude SARS-CoV-2 infection and should not be used as the sole basis for treatment or other patient management decisions.  A negative result may occur with improper specimen collection / handling, submission of specimen other than nasopharyngeal swab, presence of viral mutation(s) within the areas targeted by this assay, and inadequate number of viral copies (<250 copies / mL). A negative result must be combined with clinical observations, patient history, and epidemiological information.  Fact Sheet for Patients:   StrictlyIdeas.no  Fact Sheet for Healthcare Providers: BankingDealers.co.za  This test is not yet approved or  cleared by the [place] FDA and has been authorized for detection and/or diagnosis of SARS-CoV-2 by FDA under an Emergency Use Authorization (EUA).  This EUA will remain in effect (meaning this test can be used) for the duration of the COVID-19 declaration under Section 564(b)(1) of the Act, 21 U.S.C. section 360bbb-3(b)(1), unless the authorization is terminated or revoked sooner.  Performed at [facility], [address] [place], [place] [address]     Time coordinating discharge: Approximately 40 minutes  Patrecia Pour, MD  [facility] 11/05/2019, 12:57 PM

## 2019-11-05 NOTE — TOC Progression Note (Signed)
Transition of Care [facility]) - Progression Note    Patient Details  Name: Beth Frey MRN: [id number] Date of Birth: [birth year]  Transition of Care [facility]) CM/SW Contact  Purcell Mouton, RN Phone Number: 11/05/2019, 1:49 PM  Clinical Narrative:     Pt states that there are no HH needs.        Expected Discharge Plan and Services           Expected Discharge Date: 11/05/19                                     Social Determinants of Health (SDOH) Interventions    Readmission Risk Interventions No flowsheet data found.

## 2019-11-06 ENCOUNTER — Encounter: Payer: Self-pay | Admitting: Family Medicine

## 2019-11-06 DIAGNOSIS — R7303 Prediabetes: Secondary | ICD-10-CM | POA: Insufficient documentation

## 2019-11-06 LAB — HEMOGLOBIN A1C
Hgb A1c MFr Bld: 5.7 % — ABNORMAL HIGH (ref 4.8–5.6)
Mean Plasma Glucose: 117 mg/dL

## 2019-11-12 DIAGNOSIS — M25551 Pain in right hip: Secondary | ICD-10-CM | POA: Diagnosis not present

## 2019-11-12 DIAGNOSIS — M7061 Trochanteric bursitis, right hip: Secondary | ICD-10-CM | POA: Diagnosis not present

## 2019-11-18 ENCOUNTER — Other Ambulatory Visit: Payer: Self-pay | Admitting: Family Medicine

## 2019-11-28 ENCOUNTER — Other Ambulatory Visit: Payer: Self-pay

## 2019-11-28 ENCOUNTER — Encounter: Payer: Self-pay | Admitting: Family Medicine

## 2019-11-28 ENCOUNTER — Ambulatory Visit (INDEPENDENT_AMBULATORY_CARE_PROVIDER_SITE_OTHER): Payer: BC Managed Care – PPO | Admitting: Family Medicine

## 2019-11-28 VITALS — BP 120/64 | HR 72 | Temp 97.6°F | Resp 18 | Ht 68.0 in | Wt 149.8 lb

## 2019-11-28 DIAGNOSIS — E782 Mixed hyperlipidemia: Secondary | ICD-10-CM

## 2019-11-28 DIAGNOSIS — I10 Essential (primary) hypertension: Secondary | ICD-10-CM | POA: Insufficient documentation

## 2019-11-28 DIAGNOSIS — I447 Left bundle-branch block, unspecified: Secondary | ICD-10-CM

## 2019-11-28 DIAGNOSIS — R55 Syncope and collapse: Secondary | ICD-10-CM | POA: Diagnosis not present

## 2019-11-28 HISTORY — DX: Essential (primary) hypertension: I10

## 2019-11-28 MED ORDER — ROSUVASTATIN CALCIUM 10 MG PO TABS: 10.0000 mg | ORAL_TABLET | Freq: Every day | ORAL | 3 refills | Status: DC

## 2019-11-28 MED ORDER — AMLODIPINE BESYLATE 5 MG PO TABS: 5.0000 mg | ORAL_TABLET | Freq: Every day | ORAL | 1 refills | Status: DC

## 2019-11-28 NOTE — Patient Instructions (Addendum)
Take amlodipine at night and see if that helps  With the AM dizziness. If not we may need to divide doses and see if this is better.   -follow up with Dr. Harrell Gave for cardiology for left bundle branch block. i'll send her this to see about holter monitor.   -changed your cholesterol medication to crestor. Let's recheck your cholesterol in 6 months and will check your a1c at that time as well.   See you in 6 months! alli Beth Frey

## 2019-11-28 NOTE — Progress Notes (Signed)
Patient: Beth Frey MRN: [id number] DOB: 03-12-1956 PCP: Orma Flaming, MD     Subjective:  Chief Complaint  Patient presents with  . Follow-up    No dizziness but she stated that she does feel weak.     HPI: The patient is a 64 y.o. female who presents today for hospitalization follow up.   Admit date: 11/04/2019 Discharge date: 11/05/2019  Summary Beth Frey a 64 y.o.femalewith a history of HTN and right hemicolectomy with associated chronic hypokalemia who presented to the ED 6/29 after passing out. She was urinating in a bathroom during breakfast, felt lightheaded momentarily and woke up on the floor having fallen forward and losing consciousness briefly. Did not have postictal symptoms and no palpitations. In the ED, vital signs were stable. Labs including troponin reassuring. CXR unremarkable, though ECG demonstrated LBBB not seen prior. Cardiology was consulted, recommended observation on telemetry. Echocardiogram performed the next day revealed no significant structural heart disease and telemetry has not revealed any significant arrhythmias. Cardiology evaluated the patient, making medication recommendations and cleared for discharge with plans for outpatient cardiac monitoring.   1) syncope Likely micturition syncope. Possibly related to labile BP. Negative work up in hospital. Outpatient zio patch by cardiology; however, she tells me insurance will not pay. She has had no other syncopal episodes. She does feel light headed in the AM until about 1pm. She isn't sure if it's the blood pressure medication.   2) new LBBB -negative troponins -outpatient w/u with cardiology. She has an appointment with cardiology in August.  -no chest pain.  -started on statin.   3) HTN -tapering clonidine and started on norvasc. She is completely off of the clonidine. She states highest it goes is 140/96. She is checking bp 5-6x/time. Typically it's 120/60-80. She takes her norvasc in the  AM. She feels like she is more low in the AM and can be wobbly in the AM.   4) hyperlipidemia Started on lipitor 40mg .  im going to change her to crestor.   Review of Systems  Constitutional: Positive for fatigue. Negative for chills and fever.  HENT: Negative for dental problem, ear pain, hearing loss and trouble swallowing.   Eyes: Negative for visual disturbance.  Respiratory: Negative for cough, chest tightness and shortness of breath.   Cardiovascular: Negative for chest pain, palpitations and leg swelling.  Gastrointestinal: Negative for abdominal pain, blood in stool, diarrhea and nausea.  Endocrine: Negative for cold intolerance, polydipsia, polyphagia and polyuria.  Genitourinary: Negative for dysuria and hematuria.  Musculoskeletal: Negative for arthralgias.  Skin: Negative for rash.  Neurological: Negative for dizziness, numbness and headaches.  Psychiatric/Behavioral: Negative for dysphoric mood and sleep disturbance. The patient is not nervous/anxious.     Allergies Patient is allergic to fish allergy, sulfa antibiotics, doxycycline, and trazodone and nefazodone.  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  Surgical History Patient  has a past surgical history that includes Wrist surgery (2009); Elbow surgery; Ovarian cyst removal; Skin cancer excision; Uterine ablation; Tubal ligation; Colon surgery (02/20/2010); Joint replacement (2006); Breast surgery (August 2011); and Augmentation mammaplasty.  Family History Pateint's family history includes Cancer in her brother, brother, father, maternal grandfather, mother, and paternal grandmother; Heart disease in her paternal grandfather; Parkinson's disease in her mother; Stroke in her maternal grandmother.  Social History Patient  reports that she has never smoked. She has never used  smokeless  tobacco. She reports that she does not drink alcohol and does not use drugs.    Objective: Vitals:   11/28/19 1028  BP: (!) 120/64  Pulse: 72  Resp: 18  Temp: 97.6 F (36.4 C)  TempSrc: Temporal  SpO2: 99%  Weight: 149 lb 12.8 oz (67.9 kg)  Height: 5\' 8"  (1.727 m)    Body mass index is 22.78 kg/m.  Physical Exam Vitals reviewed.  Constitutional:      Appearance: Normal appearance. She is normal weight.  HENT:     Head: Normocephalic and atraumatic.  Eyes:     Extraocular Movements: Extraocular movements intact.     Pupils: Pupils are equal, round, and reactive to light.  Neck:     Vascular: No carotid bruit.  Cardiovascular:     Rate and Rhythm: Normal rate and regular rhythm.     Pulses: Normal pulses.     Heart sounds: Normal heart sounds.  Pulmonary:     Effort: Pulmonary effort is normal.     Breath sounds: Normal breath sounds.  Abdominal:     General: Abdomen is flat. Bowel sounds are normal.     Palpations: Abdomen is soft.  Skin:    General: Skin is warm.     Capillary Refill: Capillary refill takes less than 2 seconds.  Neurological:     General: No focal deficit present.     Mental Status: She is alert and oriented to person, place, and time.  Psychiatric:        Mood and Affect: Mood normal.        Behavior: Behavior normal.      Office Visit from 06/30/2019 in [place]  PHQ-2 Total Score 0          Assessment/plan: 1. Syncope, unspecified syncope type No more events. Thought due to micturition syncope vs. Labile BP. She has weaned off clonidine and blood pressure is stabilizing. Doing well on medication. Echo/work up in hospital negative. Results/labs reviewed. Outpatient monitor trying to get scheduled. F/u as needed.   2. Mixed hyperlipidemia Started on lipitor, but im going to change her to crestor. She is okay with this. Repeat labs in 6 months and lipid panel much improved over the last 5 months.     3. Benign essential HTN Weaned off clonidine-yah! To goal today and home readings are getting closer to goal. Since having lower readings in AM and higher at night, Im going to have her take medication at night. May need to adjust to BID. Has f/u with cardiology in a month. See me back in 6 months.   4. LBBB (left bundle branch block) Outpatient f/u with cardiology. Asymptomatic. Appointment scheduled for august.   This visit occurred during the SARS-CoV-2 public health emergency.  Safety protocols were in place, including screening questions prior to the visit, additional usage of staff PPE, and extensive cleaning of exam room while observing appropriate contact time as indicated for disinfecting solutions.     Return in about 6 months (around 05/30/2020) for sugar/cholesterol .    Orma Flaming, MD [place]   11/28/2019

## 2019-12-08 ENCOUNTER — Encounter: Payer: Self-pay | Admitting: Family Medicine

## 2019-12-11 ENCOUNTER — Other Ambulatory Visit: Payer: Self-pay

## 2019-12-11 ENCOUNTER — Other Ambulatory Visit: Payer: BC Managed Care – PPO

## 2019-12-11 DIAGNOSIS — Z20822 Contact with and (suspected) exposure to covid-19: Secondary | ICD-10-CM

## 2019-12-12 ENCOUNTER — Ambulatory Visit: Payer: BC Managed Care – PPO | Admitting: Physician Assistant

## 2019-12-12 LAB — NOVEL CORONAVIRUS, NAA: SARS-CoV-2, NAA: DETECTED — AB

## 2019-12-12 LAB — SARS-COV-2, NAA 2 DAY TAT

## 2019-12-13 ENCOUNTER — Telehealth (HOSPITAL_COMMUNITY): Payer: Self-pay | Admitting: Nurse Practitioner

## 2019-12-13 NOTE — Telephone Encounter (Signed)
Called to Discuss with patient about Covid symptoms and the use of regeneron, a monoclonal antibody infusion for those with mild to moderate Covid symptoms and at a high risk of hospitalization.     Pt is qualified for this infusion at the [facility] infusion center due to co-morbid conditions and/or a member of an at-risk group.     Unable to reach pt. Left message to return call. Sent [employer].   Beckey Rutter, [place], AGNP-C [phone] ([place])

## 2019-12-15 ENCOUNTER — Telehealth: Payer: Self-pay | Admitting: Family Medicine

## 2019-12-15 NOTE — Telephone Encounter (Signed)
[facility] notified of positive COVID test from 12/11/19.

## 2019-12-16 IMAGING — DX DG FOOT COMPLETE 3+V*L*
3 series · 3 of 3 positions shown · non-contrast
Comparison: None.

CLINICAL DATA: Left fifth toe pain after recent injury

EXAM:
LEFT FOOT - COMPLETE 3+ VIEW

[foot ap]
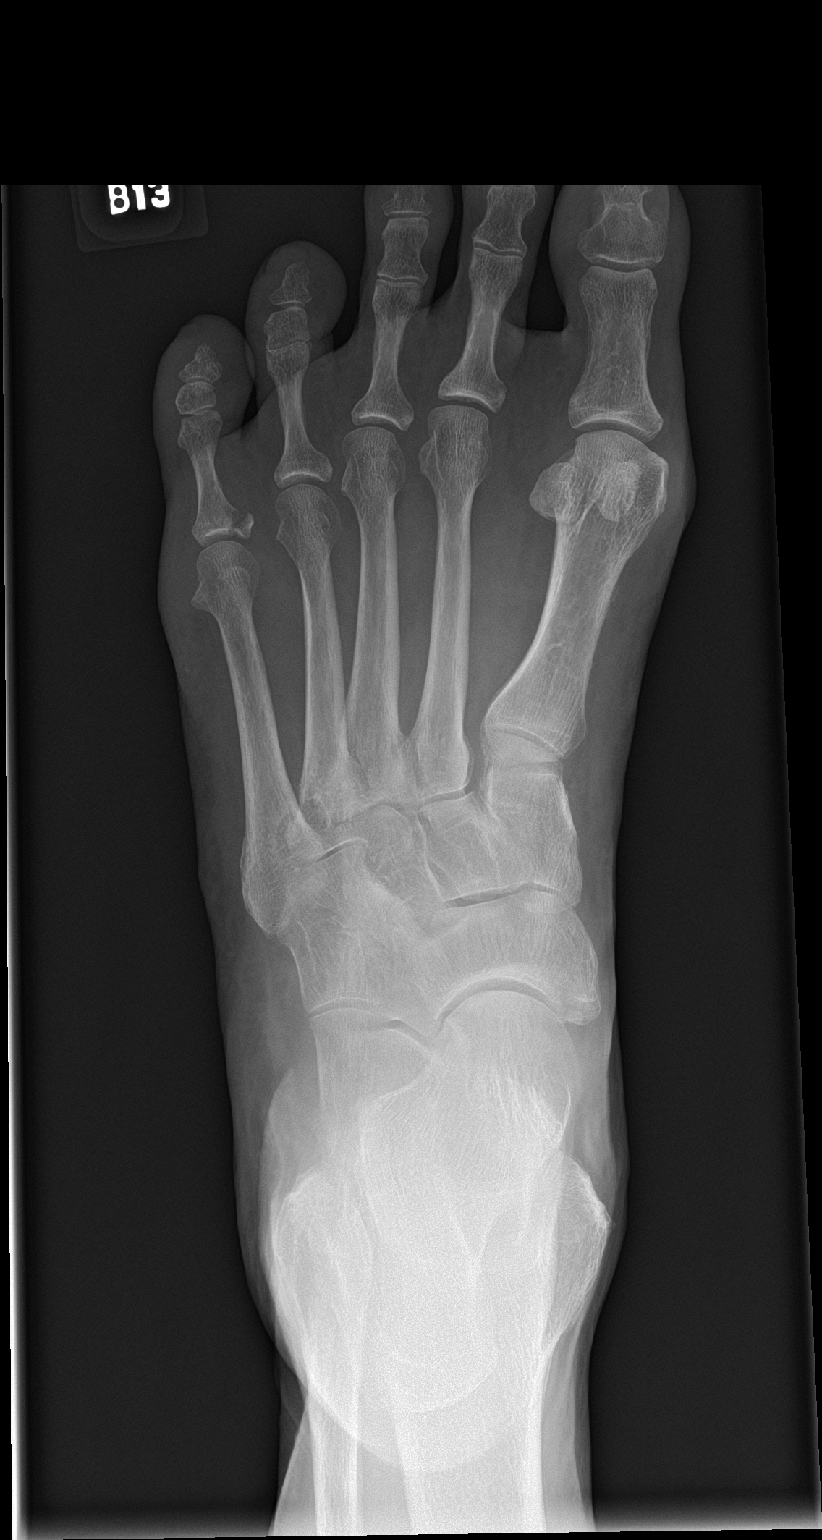

[foot obl]
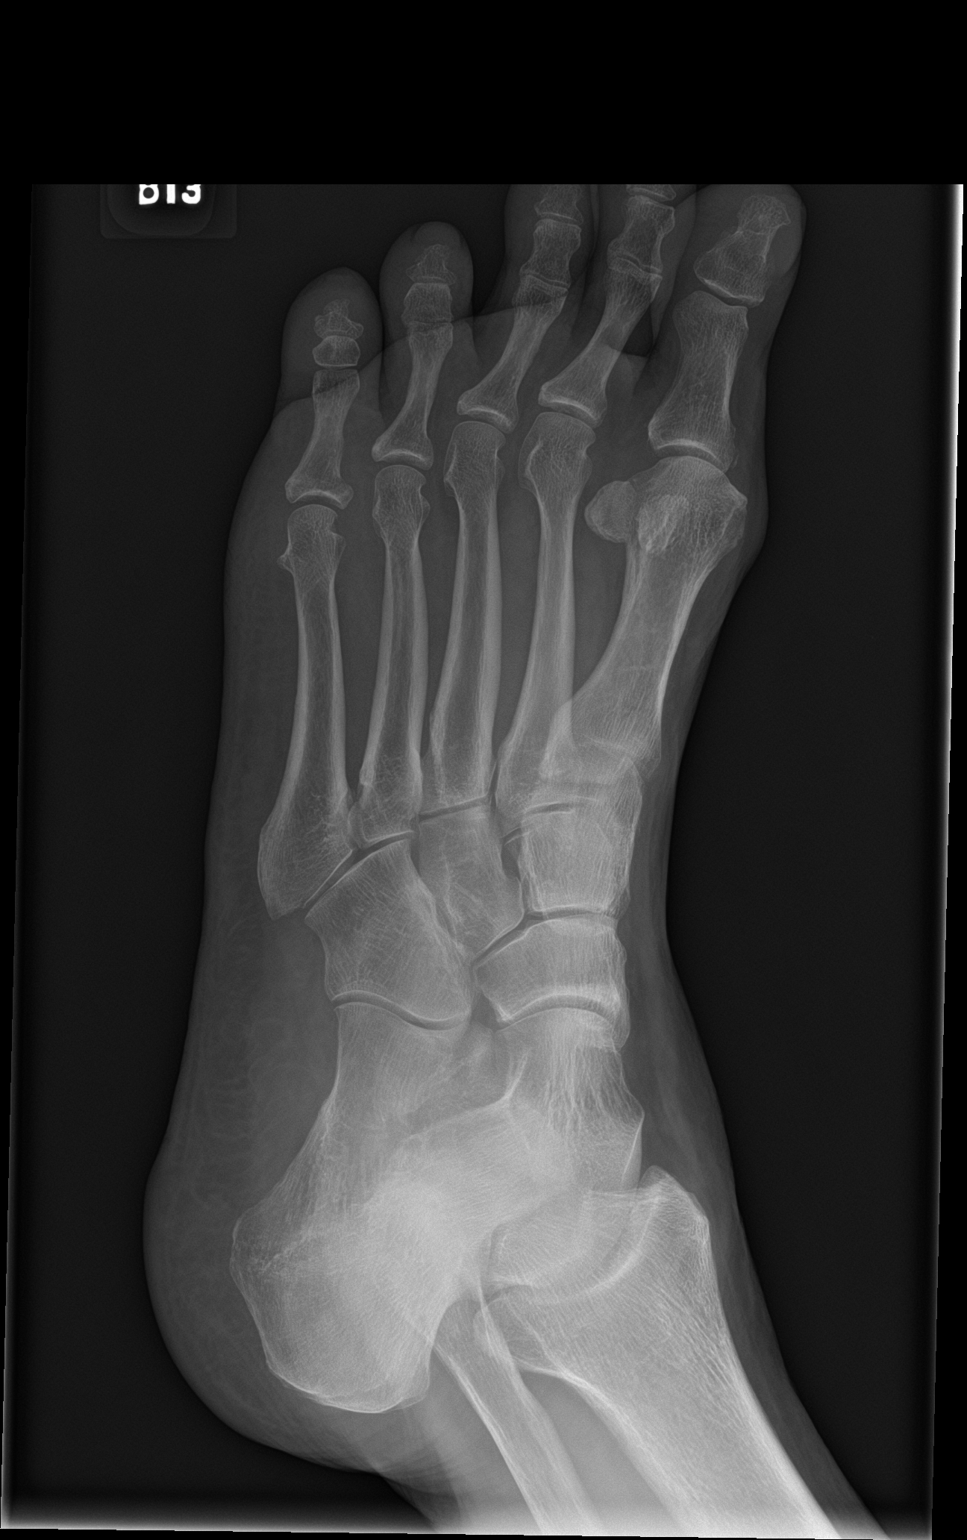

[foot lat]
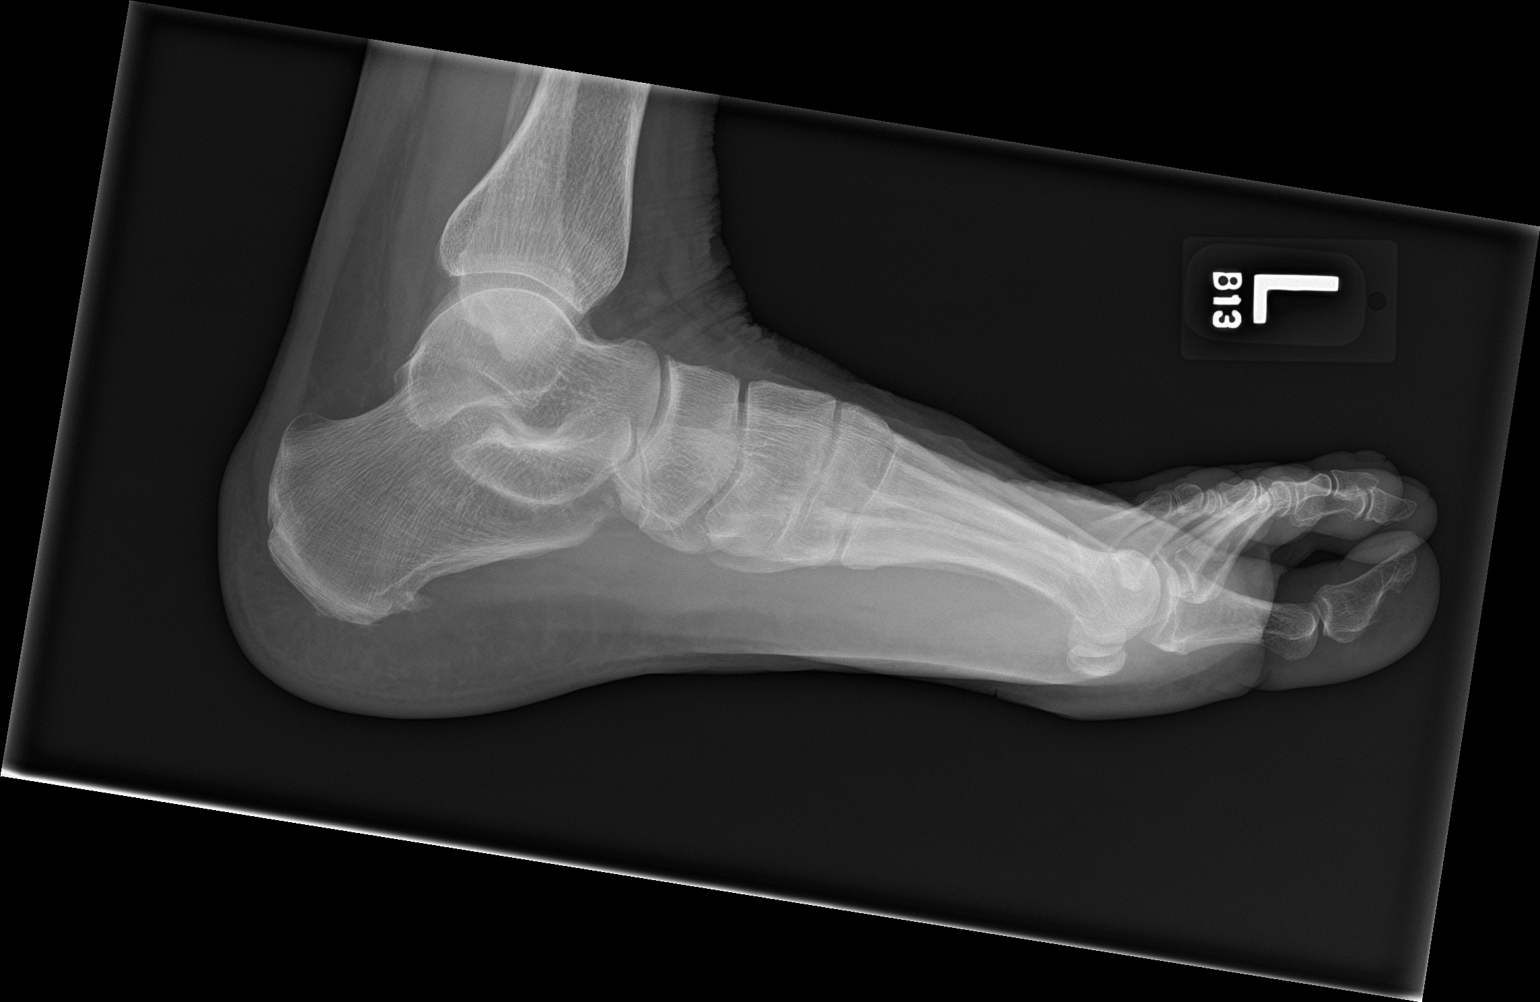

[3 of 3 positions shown; findings below may reference images not displayed]

FINDINGS: There is an intra-articular fracture of medial base of the proximal
phalanx in the left fifth toe with minimal 2 mm medial displacement
of the medial fracture fragment. There is surrounding soft tissue
swelling. No additional fracture. No dislocation. No suspicious
focal osseous lesion. No significant arthropathy. Small plantar left
calcaneal spur. No radiopaque foreign body.
IMPRESSION: Intra-articular minimally displaced fracture of the medial base of
the proximal phalanx in the left fifth toe.

## 2019-12-24 DIAGNOSIS — H16223 Keratoconjunctivitis sicca, not specified as Sjogren's, bilateral: Secondary | ICD-10-CM | POA: Diagnosis not present

## 2019-12-24 DIAGNOSIS — H2513 Age-related nuclear cataract, bilateral: Secondary | ICD-10-CM | POA: Diagnosis not present

## 2019-12-29 DIAGNOSIS — M1712 Unilateral primary osteoarthritis, left knee: Secondary | ICD-10-CM | POA: Diagnosis not present

## 2020-01-09 DIAGNOSIS — M1712 Unilateral primary osteoarthritis, left knee: Secondary | ICD-10-CM | POA: Diagnosis not present

## 2020-01-16 DIAGNOSIS — M1712 Unilateral primary osteoarthritis, left knee: Secondary | ICD-10-CM | POA: Diagnosis not present

## 2020-01-20 ENCOUNTER — Encounter: Payer: Self-pay | Admitting: Family Medicine

## 2020-01-29 DIAGNOSIS — M7918 Myalgia, other site: Secondary | ICD-10-CM | POA: Diagnosis not present

## 2020-02-11 DIAGNOSIS — M7918 Myalgia, other site: Secondary | ICD-10-CM | POA: Diagnosis not present

## 2020-02-19 ENCOUNTER — Emergency Department (HOSPITAL_COMMUNITY)
Admission: EM | Admit: 2020-02-19 | Discharge: 2020-02-19 | Disposition: A | Discharge status: Home / Self Care | Payer: BC Managed Care – PPO | Attending: Emergency Medicine | Admitting: Emergency Medicine

## 2020-02-19 ENCOUNTER — Emergency Department (HOSPITAL_COMMUNITY): Payer: BC Managed Care – PPO

## 2020-02-19 ENCOUNTER — Other Ambulatory Visit: Payer: Self-pay

## 2020-02-19 DIAGNOSIS — F10129 Alcohol abuse with intoxication, unspecified: Secondary | ICD-10-CM | POA: Diagnosis not present

## 2020-02-19 DIAGNOSIS — Z85828 Personal history of other malignant neoplasm of skin: Secondary | ICD-10-CM | POA: Insufficient documentation

## 2020-02-19 DIAGNOSIS — R404 Transient alteration of awareness: Secondary | ICD-10-CM | POA: Diagnosis not present

## 2020-02-19 DIAGNOSIS — Z96652 Presence of left artificial knee joint: Secondary | ICD-10-CM | POA: Diagnosis not present

## 2020-02-19 DIAGNOSIS — F10929 Alcohol use, unspecified with intoxication, unspecified: Secondary | ICD-10-CM | POA: Insufficient documentation

## 2020-02-19 DIAGNOSIS — Z79899 Other long term (current) drug therapy: Secondary | ICD-10-CM | POA: Insufficient documentation

## 2020-02-19 DIAGNOSIS — R55 Syncope and collapse: Secondary | ICD-10-CM | POA: Diagnosis not present

## 2020-02-19 DIAGNOSIS — I10 Essential (primary) hypertension: Secondary | ICD-10-CM | POA: Insufficient documentation

## 2020-02-19 DIAGNOSIS — I447 Left bundle-branch block, unspecified: Secondary | ICD-10-CM | POA: Diagnosis not present

## 2020-02-19 DIAGNOSIS — Y908 Blood alcohol level of 240 mg/100 ml or more: Secondary | ICD-10-CM | POA: Insufficient documentation

## 2020-02-19 DIAGNOSIS — F10920 Alcohol use, unspecified with intoxication, uncomplicated: Secondary | ICD-10-CM

## 2020-02-19 LAB — ETHANOL: Alcohol, Ethyl (B): 271 mg/dL — ABNORMAL HIGH (ref <10)

## 2020-02-19 LAB — COMPREHENSIVE METABOLIC PANEL
ALT: 49 U/L — ABNORMAL HIGH (ref 0–44)
AST: 47 U/L — ABNORMAL HIGH (ref 15–41)
Albumin: 4.6 g/dL (ref 3.5–5.0)
Alkaline Phosphatase: 48 U/L (ref 38–126)
Anion gap: 15 (ref 5–15)
BUN: 7 mg/dL — ABNORMAL LOW (ref 8–23)
CO2: 22 mmol/L (ref 22–32)
Calcium: 9.5 mg/dL (ref 8.9–10.3)
Chloride: 100 mmol/L (ref 98–111)
Creatinine, Ser: 0.58 mg/dL (ref 0.44–1.00)
GFR, Estimated: >60 mL/min (ref >60)
Glucose, Bld: 92 mg/dL (ref 70–99)
Potassium: 3.3 mmol/L — ABNORMAL LOW (ref 3.5–5.1)
Sodium: 137 mmol/L (ref 135–145)
Total Bilirubin: 0.4 mg/dL (ref 0.3–1.2)
Total Protein: 8.1 g/dL (ref 6.5–8.1)

## 2020-02-19 LAB — URINALYSIS, ROUTINE W REFLEX MICROSCOPIC
Bacteria, UA: NONE SEEN
Bilirubin Urine: NEGATIVE
Glucose, UA: NEGATIVE mg/dL
Ketones, ur: NEGATIVE mg/dL
Leukocytes,Ua: NEGATIVE
Nitrite: NEGATIVE
Protein, ur: NEGATIVE mg/dL
Specific Gravity, Urine: 1.003 — ABNORMAL LOW (ref 1.005–1.030)
pH: 5 (ref 5.0–8.0)

## 2020-02-19 LAB — CBG MONITORING, ED: Glucose-Capillary: 98 mg/dL (ref 70–99)

## 2020-02-19 LAB — CBC WITH DIFFERENTIAL/PLATELET
Abs Immature Granulocytes: 0.05 10*3/uL (ref 0.00–0.07)
Basophils Absolute: 0.1 10*3/uL (ref 0.0–0.1)
Basophils Relative: 1 %
Eosinophils Absolute: 0.2 10*3/uL (ref 0.0–0.5)
Eosinophils Relative: 2 %
HCT: 44.3 % (ref 36.0–46.0)
Hemoglobin: 14.4 g/dL (ref 12.0–15.0)
Immature Granulocytes: 1 %
Lymphocytes Relative: 38 %
Lymphs Abs: 3.5 10*3/uL (ref 0.7–4.0)
MCH: 28.7 pg (ref 26.0–34.0)
MCHC: 32.5 g/dL (ref 30.0–36.0)
MCV: 88.2 fL (ref 80.0–100.0)
Monocytes Absolute: 0.4 10*3/uL (ref 0.1–1.0)
Monocytes Relative: 5 %
Neutro Abs: 5 10*3/uL (ref 1.7–7.7)
Neutrophils Relative %: 53 %
Platelets: 310 10*3/uL (ref 150–400)
RBC: 5.02 MIL/uL (ref 3.87–5.11)
RDW: 13.5 % (ref 11.5–15.5)
WBC: 9.2 10*3/uL (ref 4.0–10.5)
nRBC: 0 % (ref 0.0–0.2)

## 2020-02-19 LAB — TROPONIN I (HIGH SENSITIVITY): Troponin I (High Sensitivity): 8 ng/L (ref <18)

## 2020-02-19 LAB — CK: Total CK: 330 U/L — ABNORMAL HIGH (ref 38–234)

## 2020-02-19 MED ORDER — SODIUM CHLORIDE 0.9 % IV BOLUS
1000.0000 mL | Freq: Once | INTRAVENOUS | Status: AC
  Administered 2020-02-19: 1000 mL via INTRAVENOUS

## 2020-02-19 NOTE — ED Provider Notes (Signed)
[facility] EMERGENCY DEPARTMENT Provider Note   CSN: [id number] Arrival date & time: 02/19/20  1928     History Chief Complaint  Patient presents with  . Loss of Consciousness    Beth Frey is a 64 y.o. female presented emergency department with an episode of syncope.  Patient reportedly was found down at the base of her stairs today by her husband, who is an emergency physician, woke up after a nap this evening to find her at the base of the stairs.  She had reportedly been packing up boxes in the house as her preparing to move.  Neither of them believe that she fell down the stairs.  He says he woke up to her moaning at the bottom of the steps, and he found her appearing somewhat confused.  The patient simply states that she must have "passed out".  She remembers packing boxes and texting one of her friends.  She reports she had about 3 glasses of wine today, and she and her husband note that this is more than she usually drinks.  She is on muscle relaxers for chronic TMJ pain, but says she has not taken any of these medications since Tuesday.    She reportedly had one prior episode of syncope several months ago.  Her husband states she had an echocardiogram which was normal.  She was not able to wear a Holter monitor due to problems with her insurance.  She has a history of chronic left bundle branch block which they have known about the past.  There is no history of MI.  Her glucose was reportedly 90 on scene today.  She reports she has been eating and drinking very little the past few days due to stress and pain in her jaw.  She has no history of diabetes.  HPI     Past Medical History:  Diagnosis Date  . Anxiety   . Back pain, chronic   . Benign essential HTN 11/28/2019  . Cancer ([place])    skin  . 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 Osteoarthritis     Patient Active Problem List   Diagnosis Date Noted  . Benign essential HTN 11/28/2019  . Prediabetes 11/06/2019    . Syncope 11/04/2019  . Temporomandibular joint disorder (TMJ) 10/10/2019  . Fibrocystic change of breast, left 04/04/2019  . Ankle impingement syndrome, left 03/26/2019  . Right foot pain 03/26/2019  . History of prosthetic unicompartmental arthroplasty of left knee 07/23/2017  . Adjustment insomnia 10/31/2016  . Carcinoma in situ of skin of eyelid including canthus 06/26/2012  . Hyperlipidemia 11/23/2011  . Vitamin D deficiency 11/23/2011  . History of abnormal cervical Pap smear 11/23/2011  . Thyroid nodule 11/23/2011  . Skin cancer 11/23/2011  . Chronic hypokalemia 11/23/2011  . Incisional hernia 06/14/2011    Past Surgical History:  Procedure Laterality Date  . AUGMENTATION MAMMAPLASTY     Pt has had explants  . BREAST SURGERY  August 2011   Implants removed  . COLON SURGERY  02/20/2010   Right hemicolectomy-cecal bascule  . ELBOW SURGERY     left elbow  . JOINT REPLACEMENT  2006   Left total knee replacement  . OVARIAN CYST REMOVAL     right  . SKIN CANCER EXCISION     multiple-SCC and BCC  . TUBAL LIGATION    . Uterine ablation    . WRIST SURGERY  2009   right wrist     OB History  Gravida  0   Para  0   Term  0   Preterm  0   AB  0   Living  0     SAB  0   TAB  0   Ectopic  0   Multiple  0   Live Births  0           Family History  Problem Relation Age of Onset  . Cancer Mother        multiple myeloma  . Parkinson's disease Mother   . Cancer Father        stomach cancer  . Cancer Brother        pancreatic cancer  . Stroke Maternal Grandmother   . Cancer Maternal Grandfather        lung cancer  . Cancer Paternal Grandmother        ovarian  . Heart disease Paternal Grandfather   . Cancer Brother        multiple myeloma    Social History   Tobacco Use  . Smoking status: Never Smoker  . Smokeless tobacco: Never Used  Substance Use Topics  . Alcohol use: No    Alcohol/week: 0.0 standard drinks  . Drug use: No     Home Medications Prior to Admission medications   Medication Sig Start Date End Date Taking? Authorizing Provider  amLODipine (NORVASC) 5 MG tablet Take 1 tablet (5 mg total) by mouth daily. 11/28/19   Orma Flaming, MD  celecoxib (CELEBREX) 200 MG capsule Take 200 mg by mouth daily.  10/22/19   [provider]  EPINEPHrine 0.3 mg/0.3 mL IJ SOAJ injection Inject 0.3 mg into the muscle as needed for anaphylaxis.  01/10/19   [provider]  mometasone (NASONEX) 50 MCG/ACT nasal spray Place 2 sprays into the nose daily as needed (allergies).     [provider]  Multiple Vitamin (MULTIVITAMIN WITH MINERALS) TABS tablet Take 1 tablet by mouth daily.    [provider]  mupirocin ointment (BACTROBAN) 2 % Apply 1 application topically daily as needed (rash).  09/26/18   [provider]  polyethylene glycol (MIRALAX / GLYCOLAX) packet Take 17 g by mouth daily as needed for mild constipation or moderate constipation.     [provider]  potassium chloride (MICRO-K) 10 MEQ CR capsule TAKE 1 CAPSULE (10 MEQ TOTAL) BY MOUTH 3 (THREE) TIMES DAILY. 11/18/19   Shelda Pal, DO  rosuvastatin (CRESTOR) 10 MG tablet Take 1 tablet (10 mg total) by mouth daily. 11/28/19   Orma Flaming, MD  sertraline (ZOLOFT) 50 MG tablet TAKE 1 TABLET BY MOUTH EVERY DAY 10/08/19   Orma Flaming, MD    Allergies    Fish allergy, Sulfa antibiotics, Doxycycline, and Trazodone and nefazodone  Review of Systems   Review of Systems  Constitutional: Negative for chills and fever.  HENT: Positive for facial swelling. Negative for ear pain.   Eyes: Negative for pain and visual disturbance.  Respiratory: Negative for cough and shortness of breath.   Cardiovascular: Negative for chest pain and palpitations.  Gastrointestinal: Negative for abdominal pain and vomiting.  Genitourinary: Negative for dysuria and hematuria.  Musculoskeletal: Positive for arthralgias and  myalgias.  Skin: Negative for color change and rash.  Neurological: Positive for syncope and light-headedness. Negative for facial asymmetry and headaches.  All other systems reviewed and are negative.   Physical Exam Updated Vital Signs BP 123/77 (BP Location: Left Arm)   Pulse 77  Temp 98.2 F (36.8 C) (Oral)   Resp 19   Ht '5\' 8"'  (1.727 m)   Wt 67.9 kg   LMP  (LMP Unknown)   SpO2 97%   BMI 22.76 kg/m   Physical Exam Vitals and nursing note reviewed.  Constitutional:      General: She is not in acute distress.    Appearance: She is well-developed.     Comments: Mildly slurred speech  HENT:     Head: Normocephalic and atraumatic.  Eyes:     Pupils: Pupils are equal, round, and reactive to light.     Comments: Injected sclera bilaterally  Cardiovascular:     Rate and Rhythm: Normal rate and regular rhythm.     Pulses: Normal pulses.  Pulmonary:     Effort: Pulmonary effort is normal. No respiratory distress.     Breath sounds: Normal breath sounds.  Abdominal:     Palpations: Abdomen is soft.     Tenderness: There is no abdominal tenderness.  Musculoskeletal:        General: No swelling or deformity.     Cervical back: Normal range of motion and neck supple. No rigidity.     Comments: Small abrasion to left elbow, no significant tenderness, normal ROM  Skin:    General: Skin is warm and dry.  Neurological:     General: No focal deficit present.     Mental Status: She is alert and oriented to person, place, and time.     ED Results / Procedures / Treatments   Labs (all labs ordered are listed, but only abnormal results are displayed) Labs Reviewed  COMPREHENSIVE METABOLIC PANEL - Abnormal; Notable for the following components:      Result Value   Potassium 3.3 (*)    BUN 7 (*)    AST 47 (*)    ALT 49 (*)    All other components within normal limits  CK - Abnormal; Notable for the following components:   Total CK 330 (*)    All other components within  normal limits  ETHANOL - Abnormal; Notable for the following components:   Alcohol, Ethyl (B) 271 (*)    All other components within normal limits  URINALYSIS, ROUTINE W REFLEX MICROSCOPIC - Abnormal; Notable for the following components:   Color, Urine COLORLESS (*)    Specific Gravity, Urine 1.003 (*)    Hgb urine dipstick MODERATE (*)    All other components within normal limits  CBC WITH DIFFERENTIAL/PLATELET  CBG MONITORING, ED  TROPONIN I (HIGH SENSITIVITY)  TROPONIN I (HIGH SENSITIVITY)    EKG EKG Interpretation  Date/Time:  Thursday February 19 2020 19:31:20 EDT Ventricular Rate:  85 PR Interval:    QRS Duration: 162 QT Interval:  447 QTC Calculation: 532 R Axis:   -47 Text Interpretation: Sinus rhythm Left bundle branch block No sig change from prior No STEMI Confirmed by Octaviano Glow [id number]) on 02/19/2020 7:42:11 PM   Radiology DG Chest Portable 1 View  Result Date: 02/19/2020 CLINICAL DATA:  Recent syncopal episode EXAM: PORTABLE CHEST 1 VIEW COMPARISON:  11/04/2018 FINDINGS: Cardiac shadow is stable. Aortic calcifications are noted. The lungs are well aerated bilaterally. No infiltrate or effusion is noted. Old rib fractures are noted on the right. No acute abnormality noted. IMPRESSION: No acute abnormality noted. Electronically Signed   By: Inez Catalina M.D.   On: 02/19/2020 19:50    Procedures Procedures (including critical care time)  Medications Ordered in ED Medications  sodium  chloride 0.9 % bolus 1,000 mL (0 mLs Intravenous Stopped 02/19/20 2050)    ED Course  I have reviewed the triage vital signs and the nursing notes.  Pertinent labs & imaging results that were available during my care of the patient were reviewed by me and considered in my medical decision making (see chart for details).  64 yo female presenting to ED with possible near-syncope vs syncope.  DDx includes dehydration vs anemia vs ACS vs acute intoxication vs other  Labs  pending  She is stable on arrival.  No evidence of significant head trauma on exam.    In further discussion with the patient and her husband, they have some suspicion that this is likely related to how much alcohol she drank this evening.  She does appear clinically intoxicated to me.  We are planning to obtain screening labs and an etoh level.  If there is no evidence of alternative medical emergency, I anticipate discharge home after she has improved clinical sobriety.  *  Labs reviewed -very mild hypoK (3.3, pt states baseline is usually around 3).  CK only minorly elevated at 330.  Trop 8.  ECG unremarkable (chronic LBBB, she's had an echo in June of this year with no significant abnormalities).  UA with no sign of infection.  Okay for discharge.  Clinical Course as of Feb 19 945  Thu Feb 19, 2020  2203 Patient is stable here some clinical improvement.  Upon further discussion of both the patient and her husband, there is a strong suspicion that she may have drank more alcohol than she typically does tonight.  Her alcohol level is also elevated today.  Clinically this is in keeping with the presentation.  I have a lower suspicion for acute coronary event.  Okay for discharge.   [MT]    Clinical Course User Index [MT] Deberah Adolf, Carola Rhine, MD    Final Clinical Impression(s) / ED Diagnoses Final diagnoses:  Alcoholic intoxication without complication [facility])  Near syncope    Rx / DC Orders ED Discharge Orders    None       Wyvonnia Dusky, MD 02/20/20 [phone]

## 2020-02-19 NOTE — ED Triage Notes (Signed)
Pt from home by EMS; husband reports found pt unconscious at bottom of stairs, but says pt did not fall down stairs.  Pt tearful, says does not want to be here; pt admits to drinking wine today.  Pt alert and oriented.

## 2020-02-19 NOTE — ED Notes (Signed)
Assisted patient on and off bed pan. Patient required minimal assistance. Urine clear and light yellow. Family back at bedside.

## 2020-02-27 ENCOUNTER — Other Ambulatory Visit: Payer: Self-pay | Admitting: Family Medicine

## 2020-02-27 DIAGNOSIS — Z1231 Encounter for screening mammogram for malignant neoplasm of breast: Secondary | ICD-10-CM

## 2020-03-05 ENCOUNTER — Other Ambulatory Visit: Payer: Self-pay | Admitting: Dentist

## 2020-03-05 DIAGNOSIS — M26633 Articular disc disorder of bilateral temporomandibular joint: Secondary | ICD-10-CM

## 2020-03-05 DIAGNOSIS — M26603 Bilateral temporomandibular joint disorder, unspecified: Secondary | ICD-10-CM

## 2020-03-08 ENCOUNTER — Encounter: Payer: Self-pay | Admitting: Family Medicine

## 2020-03-20 ENCOUNTER — Encounter: Payer: Self-pay | Admitting: Family Medicine

## 2020-03-22 ENCOUNTER — Ambulatory Visit: Payer: BC Managed Care – PPO | Admitting: Family Medicine

## 2020-03-22 DIAGNOSIS — Z0289 Encounter for other administrative examinations: Secondary | ICD-10-CM

## 2020-03-24 ENCOUNTER — Other Ambulatory Visit: Payer: Self-pay | Admitting: Dentist

## 2020-03-28 ENCOUNTER — Ambulatory Visit
Admission: RE | Admit: 2020-03-28 | Discharge: 2020-03-28 | Disposition: A | Discharge status: Home / Self Care | Payer: BC Managed Care – PPO | Source: Ambulatory Visit | Attending: Dentist | Admitting: Dentist

## 2020-03-28 DIAGNOSIS — S0301XA Dislocation of jaw, right side, initial encounter: Secondary | ICD-10-CM | POA: Diagnosis not present

## 2020-03-28 DIAGNOSIS — M26633 Articular disc disorder of bilateral temporomandibular joint: Secondary | ICD-10-CM

## 2020-03-28 DIAGNOSIS — M26603 Bilateral temporomandibular joint disorder, unspecified: Secondary | ICD-10-CM

## 2020-03-31 ENCOUNTER — Other Ambulatory Visit: Payer: Self-pay | Admitting: Dentist

## 2020-03-31 ENCOUNTER — Ambulatory Visit: Payer: BC Managed Care – PPO

## 2020-03-31 DIAGNOSIS — M26603 Bilateral temporomandibular joint disorder, unspecified: Secondary | ICD-10-CM

## 2020-03-31 DIAGNOSIS — M26633 Articular disc disorder of bilateral temporomandibular joint: Secondary | ICD-10-CM

## 2020-04-05 ENCOUNTER — Ambulatory Visit
Admission: RE | Admit: 2020-04-05 | Discharge: 2020-04-05 | Disposition: A | Discharge status: Home / Self Care | Payer: BC Managed Care – PPO | Source: Ambulatory Visit | Attending: Dentist | Admitting: Dentist

## 2020-04-05 ENCOUNTER — Other Ambulatory Visit: Payer: Self-pay

## 2020-04-05 DIAGNOSIS — S0302XA Dislocation of jaw, left side, initial encounter: Secondary | ICD-10-CM | POA: Diagnosis not present

## 2020-04-05 DIAGNOSIS — M26603 Bilateral temporomandibular joint disorder, unspecified: Secondary | ICD-10-CM

## 2020-04-05 DIAGNOSIS — M26633 Articular disc disorder of bilateral temporomandibular joint: Secondary | ICD-10-CM

## 2020-04-05 DIAGNOSIS — S0301XA Dislocation of jaw, right side, initial encounter: Secondary | ICD-10-CM | POA: Diagnosis not present

## 2020-04-06 ENCOUNTER — Telehealth (HOSPITAL_BASED_OUTPATIENT_CLINIC_OR_DEPARTMENT_OTHER): Payer: Self-pay | Admitting: Emergency Medicine

## 2020-04-06 MED ORDER — CLINDAMYCIN HCL 300 MG PO CAPS: 300.0000 mg | ORAL_CAPSULE | Freq: Four times a day (QID) | ORAL | 0 refills | Status: DC

## 2020-04-06 NOTE — Telephone Encounter (Signed)
Pain and erythema surrounding skin tear of the left shin.  Patient has a history of MRSA infections at the same site.  She does not tolerate doxycycline but does tolerate clindamycin.

## 2020-04-11 ENCOUNTER — Encounter: Payer: Self-pay | Admitting: Family Medicine

## 2020-04-15 ENCOUNTER — Encounter: Payer: Self-pay | Admitting: Family Medicine

## 2020-04-15 ENCOUNTER — Other Ambulatory Visit: Payer: Self-pay

## 2020-04-15 ENCOUNTER — Ambulatory Visit (INDEPENDENT_AMBULATORY_CARE_PROVIDER_SITE_OTHER): Payer: BC Managed Care – PPO | Admitting: Family Medicine

## 2020-04-15 VITALS — BP 132/80 | HR 81 | Temp 97.9°F | Ht 68.0 in | Wt 138.8 lb

## 2020-04-15 DIAGNOSIS — E559 Vitamin D deficiency, unspecified: Secondary | ICD-10-CM

## 2020-04-15 DIAGNOSIS — I10 Essential (primary) hypertension: Secondary | ICD-10-CM

## 2020-04-15 DIAGNOSIS — R7303 Prediabetes: Secondary | ICD-10-CM | POA: Diagnosis not present

## 2020-04-15 DIAGNOSIS — E782 Mixed hyperlipidemia: Secondary | ICD-10-CM

## 2020-04-15 DIAGNOSIS — M26623 Arthralgia of bilateral temporomandibular joint: Secondary | ICD-10-CM

## 2020-04-15 NOTE — Telephone Encounter (Signed)
Pt was seen in office today. 04/15/2020

## 2020-04-15 NOTE — Patient Instructions (Signed)
You look great!  Routine labs today for everything and let me know if you need anything else. !  Merry christmas! Dr. Rogers Blocker

## 2020-04-15 NOTE — Progress Notes (Signed)
Patient: Beth Frey MRN: [id number] DOB: 01/07/1956 PCP: Orma Flaming, MD     Subjective:  Chief Complaint  Patient presents with  . Temporomandibular Joint Pain    Degeneration of joints. Likely needs replaced joints. Requested RA w/u by oral maxillary surgeon   . Hypertension  . Prediabetes    HPI: The patient is a 64 y.o. female who presents today for RA workup due to jaw degeneration. She likely is going to need tmj replacement. She denies any other joint issues. Specifically denies swelling or pain or deformity/stiffness in her hands, shoulders, knees or anywhere else on her body. No skin changes or vision issues. She has lost weight and is unable to really chew and has problems talking due to her jaw.   Hypertension -well controlled off medication.   Hyperlipidemia Currently on crestor. Had labs checked 5 months ago and much improved.  The 10-year ASCVD risk score Mikey Bussing DC Jr., et al., 2013) is: 5.8%  Prediabetes Due for a1c. Last checked 6 months ago and was 5.7.   Vit. D deficiency -on 5000IU/day.   Review of Systems  Constitutional: Negative for chills, fatigue and fever.  HENT: Negative for dental problem, ear pain, hearing loss and trouble swallowing.   Eyes: Negative for visual disturbance.  Respiratory: Negative for cough, chest tightness and shortness of breath.   Cardiovascular: Negative for chest pain, palpitations and leg swelling.  Gastrointestinal: Negative for abdominal pain, blood in stool, diarrhea and nausea.  Endocrine: Negative for cold intolerance, polydipsia, polyphagia and polyuria.  Genitourinary: Negative for dysuria and hematuria.  Musculoskeletal: Positive for arthralgias (jaw only ). Negative for back pain and joint swelling.  Skin: Negative for rash.  Neurological: Negative for dizziness and headaches.  Psychiatric/Behavioral: Negative for dysphoric mood and sleep disturbance. The patient is not nervous/anxious.     Allergies Patient  is allergic to fish allergy, sulfa antibiotics, doxycycline, and trazodone and nefazodone.  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  Surgical History Patient  has a past surgical history that includes Wrist surgery (2009); Elbow surgery; Ovarian cyst removal; Skin cancer excision; Uterine ablation; Tubal ligation; Colon surgery (02/20/2010); Joint replacement (2006); Breast surgery (August 2011); and Augmentation mammaplasty.  Family History Pateint's family history includes Cancer in her brother, brother, father, maternal grandfather, mother, and paternal grandmother; Heart disease in her paternal grandfather; Parkinson's disease in her mother; Stroke in her maternal grandmother.  Social History Patient  reports that she has never smoked. She has never used smokeless tobacco. She reports that she does not drink alcohol and does not use drugs.    Objective: Vitals:   04/15/20 1030  BP: 132/80  Pulse: 81  Temp: 97.9 F (36.6 C)  TempSrc: Temporal  SpO2: 100%  Weight: 138 lb 12.8 oz (63 kg)  Height: 5\' 8"  (1.727 m)    Body mass index is 21.1 kg/m.  Physical Exam Vitals reviewed.  Constitutional:      Appearance: Normal appearance. She is well-developed, normal weight and well-nourished.  HENT:     Head: Normocephalic and atraumatic.     Right Ear: External ear normal.     Left Ear: External ear normal.     Mouth/Throat:     Mouth: Oropharynx is clear and moist.  Eyes:     Extraocular Movements: EOM normal.     Conjunctiva/sclera: Conjunctivae normal.     Pupils: Pupils are  equal, round, and reactive to light.  Neck:     Thyroid: No thyromegaly.     Vascular: No carotid bruit.  Cardiovascular:     Rate and Rhythm: Normal rate and regular rhythm.     Pulses: Intact distal pulses.     Heart sounds: Normal heart sounds. No murmur  heard.   Pulmonary:     Effort: Pulmonary effort is normal.     Breath sounds: Normal breath sounds.  Abdominal:     General: Abdomen is flat. Bowel sounds are normal. There is no distension.     Palpations: Abdomen is soft.     Tenderness: There is no abdominal tenderness.  Musculoskeletal:        General: No swelling, tenderness or deformity. Normal range of motion.     Cervical back: Normal range of motion and neck supple.  Lymphadenopathy:     Cervical: No cervical adenopathy.  Skin:    General: Skin is warm and dry.     Capillary Refill: Capillary refill takes less than 2 seconds.     Findings: No rash.  Neurological:     General: No focal deficit present.     Mental Status: She is alert and oriented to person, place, and time.     Cranial Nerves: No cranial nerve deficit.     Coordination: Coordination normal.     Deep Tendon Reflexes: Reflexes normal.  Psychiatric:        Mood and Affect: Mood and affect and mood normal.        Behavior: Behavior normal.        Assessment/plan: 1. Bilateral temporomandibular joint pain Normal joint exam. No suspicious findings for RA, but will work up per oral maxillary request. Continue meds per surgeon and let me know if she needs anything if they get surgery set.  - CBC with Differential/Platelet; Future - Uric acid; Future - Sedimentation rate; Future - C-reactive protein; Future - VITAMIN D 25 Hydroxy (Vit-D Deficiency, Fractures); Future - Rheumatoid factor; Future - Cyclic citrul peptide antibody, IgG; Future - ANA; Future  2. Mixed hyperlipidemia Continue crestor. Fasting labs today.   3. Prediabetes Labs today. a1c last 5.7.   4. Vitamin D deficiency Continue daily replacement.   5. HTN To goal off medication.     This visit occurred during the SARS-CoV-2 public health emergency.  Safety protocols were in place, including screening questions prior to the visit, additional usage of staff PPE, and extensive  cleaning of exam room while observing appropriate contact time as indicated for disinfecting solutions.      Return in about 6 months (around 10/14/2020) for prediabetes/htn.   Orma Flaming, MD [place]   04/15/2020

## 2020-04-17 LAB — CBC WITH DIFFERENTIAL/PLATELET
Absolute Monocytes: 748 cells/uL (ref 200–950)
Basophils Absolute: 36 cells/uL (ref 0–200)
Basophils Relative: 0.4 %
Eosinophils Absolute: 276 cells/uL (ref 15–500)
Eosinophils Relative: 3.1 %
HCT: 44.2 % (ref 35.0–45.0)
Hemoglobin: 14.8 g/dL (ref 11.7–15.5)
Lymphs Abs: 3578 cells/uL (ref 850–3900)
MCH: 30.5 pg (ref 27.0–33.0)
MCHC: 33.5 g/dL (ref 32.0–36.0)
MCV: 90.9 fL (ref 80.0–100.0)
MPV: 10.8 fL (ref 7.5–12.5)
Monocytes Relative: 8.4 %
Neutro Abs: 4263 cells/uL (ref 1500–7800)
Neutrophils Relative %: 47.9 %
Platelets: 293 10*3/uL (ref 140–400)
RBC: 4.86 10*6/uL (ref 3.80–5.10)
RDW: 13.1 % (ref 11.0–15.0)
Total Lymphocyte: 40.2 %
WBC: 8.9 10*3/uL (ref 3.8–10.8)

## 2020-04-17 LAB — RHEUMATOID FACTOR: Rheumatoid fact SerPl-aCnc: <14 IU/mL (ref <14)

## 2020-04-17 LAB — SEDIMENTATION RATE: Sed Rate: 2 mm/h (ref 0–30)

## 2020-04-17 LAB — ANA: Anti Nuclear Antibody (ANA): NEGATIVE

## 2020-04-17 LAB — C-REACTIVE PROTEIN: CRP: 2.9 mg/L (ref <8.0)

## 2020-04-17 LAB — URIC ACID: Uric Acid, Serum: 5.6 mg/dL (ref 2.5–7.0)

## 2020-04-17 LAB — VITAMIN D 25 HYDROXY (VIT D DEFICIENCY, FRACTURES): Vit D, 25-Hydroxy: 58 ng/mL (ref 30–100)

## 2020-04-17 LAB — CYCLIC CITRUL PEPTIDE ANTIBODY, IGG: Cyclic Citrullin Peptide Ab: <16 UNITS

## 2020-04-27 DIAGNOSIS — M26643 Arthritis of bilateral temporomandibular joint: Secondary | ICD-10-CM | POA: Diagnosis not present

## 2020-05-09 ENCOUNTER — Encounter: Payer: Self-pay | Admitting: Family Medicine

## 2020-05-11 ENCOUNTER — Other Ambulatory Visit: Payer: Self-pay | Admitting: Family Medicine

## 2020-05-11 ENCOUNTER — Other Ambulatory Visit: Payer: Self-pay

## 2020-05-11 MED ORDER — DIAZEPAM 10 MG PO TABS: 10.0000 mg | ORAL_TABLET | Freq: Every evening | ORAL | 0 refills | Status: DC | PRN

## 2020-05-11 MED ORDER — CYCLOBENZAPRINE HCL 10 MG PO TABS: 10.0000 mg | ORAL_TABLET | Freq: Every day | ORAL | 1 refills | Status: DC

## 2020-05-17 ENCOUNTER — Other Ambulatory Visit: Payer: Self-pay

## 2020-05-17 ENCOUNTER — Ambulatory Visit
Admission: RE | Admit: 2020-05-17 | Discharge: 2020-05-17 | Disposition: A | Discharge status: Home / Self Care | Payer: PRIVATE HEALTH INSURANCE | Source: Ambulatory Visit | Attending: Family Medicine | Admitting: Family Medicine

## 2020-05-17 DIAGNOSIS — Z1231 Encounter for screening mammogram for malignant neoplasm of breast: Secondary | ICD-10-CM

## 2020-05-24 DIAGNOSIS — D169 Benign neoplasm of bone and articular cartilage, unspecified: Secondary | ICD-10-CM | POA: Insufficient documentation

## 2020-05-24 DIAGNOSIS — D480 Neoplasm of uncertain behavior of bone and articular cartilage: Secondary | ICD-10-CM | POA: Insufficient documentation

## 2020-05-31 ENCOUNTER — Ambulatory Visit: Payer: BC Managed Care – PPO | Admitting: Family Medicine

## 2020-06-17 ENCOUNTER — Encounter: Payer: Self-pay | Admitting: Family Medicine

## 2020-07-02 ENCOUNTER — Other Ambulatory Visit: Payer: Self-pay

## 2020-07-02 ENCOUNTER — Encounter: Payer: Self-pay | Admitting: Family Medicine

## 2020-07-02 ENCOUNTER — Ambulatory Visit (INDEPENDENT_AMBULATORY_CARE_PROVIDER_SITE_OTHER): Payer: Self-pay | Admitting: Family Medicine

## 2020-07-02 VITALS — BP 118/70 | HR 92 | Temp 97.8°F | Ht 68.0 in | Wt 139.2 lb

## 2020-07-02 DIAGNOSIS — M26609 Unspecified temporomandibular joint disorder, unspecified side: Secondary | ICD-10-CM

## 2020-07-02 DIAGNOSIS — Z0000 Encounter for general adult medical examination without abnormal findings: Secondary | ICD-10-CM

## 2020-07-02 DIAGNOSIS — E559 Vitamin D deficiency, unspecified: Secondary | ICD-10-CM

## 2020-07-02 DIAGNOSIS — E782 Mixed hyperlipidemia: Secondary | ICD-10-CM

## 2020-07-02 DIAGNOSIS — I10 Essential (primary) hypertension: Secondary | ICD-10-CM

## 2020-07-02 DIAGNOSIS — R7303 Prediabetes: Secondary | ICD-10-CM

## 2020-07-02 LAB — CBC WITH DIFFERENTIAL/PLATELET
Basophils Absolute: 0 10*3/uL (ref 0.0–0.1)
Basophils Relative: 0.3 % (ref 0.0–3.0)
Eosinophils Absolute: 0.3 10*3/uL (ref 0.0–0.7)
Eosinophils Relative: 3.3 % (ref 0.0–5.0)
HCT: 41.4 % (ref 36.0–46.0)
Hemoglobin: 14 g/dL (ref 12.0–15.0)
Lymphocytes Relative: 39.1 % (ref 12.0–46.0)
Lymphs Abs: 3.1 10*3/uL (ref 0.7–4.0)
MCHC: 33.7 g/dL (ref 30.0–36.0)
MCV: 89.5 fl (ref 78.0–100.0)
Monocytes Absolute: 0.6 10*3/uL (ref 0.1–1.0)
Monocytes Relative: 7.7 % (ref 3.0–12.0)
Neutro Abs: 3.9 10*3/uL (ref 1.4–7.7)
Neutrophils Relative %: 49.6 % (ref 43.0–77.0)
Platelets: 238 10*3/uL (ref 150.0–400.0)
RBC: 4.63 Mil/uL (ref 3.87–5.11)
RDW: 13.6 % (ref 11.5–15.5)
WBC: 7.8 10*3/uL (ref 4.0–10.5)

## 2020-07-02 LAB — COMPREHENSIVE METABOLIC PANEL
ALT: 40 U/L — ABNORMAL HIGH (ref 0–35)
AST: 35 U/L (ref 0–37)
Albumin: 4.5 g/dL (ref 3.5–5.2)
Alkaline Phosphatase: 42 U/L (ref 39–117)
BUN: 20 mg/dL (ref 6–23)
CO2: 27 mEq/L (ref 19–32)
Calcium: 9.9 mg/dL (ref 8.4–10.5)
Chloride: 101 mEq/L (ref 96–112)
Creatinine, Ser: 0.66 mg/dL (ref 0.40–1.20)
GFR: 93.11 mL/min (ref >60.00)
Glucose, Bld: 81 mg/dL (ref 70–99)
Potassium: 3.8 mEq/L (ref 3.5–5.1)
Sodium: 139 mEq/L (ref 135–145)
Total Bilirubin: 0.7 mg/dL (ref 0.2–1.2)
Total Protein: 7.5 g/dL (ref 6.0–8.3)

## 2020-07-02 LAB — LIPID PANEL
Cholesterol: 166 mg/dL (ref 0–200)
HDL: 84.5 mg/dL (ref >39.00)
LDL Cholesterol: 69 mg/dL (ref 0–99)
NonHDL: 81.22
Total CHOL/HDL Ratio: 2
Triglycerides: 59 mg/dL (ref 0.0–149.0)
VLDL: 11.8 mg/dL (ref 0.0–40.0)

## 2020-07-02 LAB — HEMOGLOBIN A1C: Hgb A1c MFr Bld: 5.9 % (ref 4.6–6.5)

## 2020-07-02 LAB — TSH: TSH: 1.65 u[IU]/mL (ref 0.35–4.50)

## 2020-07-02 NOTE — Patient Instructions (Signed)
-routine labs today! Let me know if you need anything!  Beth Frey   Preventive Care 14-64 Years Old, Female Preventive care refers to lifestyle choices and visits with your health care provider that can promote health and wellness. This includes:  A yearly physical exam. This is also called an annual wellness visit.  Regular dental and eye exams.  Immunizations.  Screening for certain conditions.  Healthy lifestyle choices, such as: ? Eating a healthy diet. ? Getting regular exercise. ? Not using drugs or products that contain nicotine and tobacco. ? Limiting alcohol use. What can I expect for my preventive care visit? Physical exam Your health care provider will check your:  Height and weight. These may be used to calculate your BMI (body mass index). BMI is a measurement that tells if you are at a healthy weight.  Heart rate and blood pressure.  Body temperature.  Skin for abnormal spots. Counseling Your health care provider may ask you questions about your:  Past medical problems.  Family's medical history.  Alcohol, tobacco, and drug use.  Emotional well-being.  Home life and relationship well-being.  Sexual activity.  Diet, exercise, and sleep habits.  Work and work Statistician.  Access to firearms.  Method of birth control.  Menstrual cycle.  Pregnancy history. What immunizations do I need? Vaccines are usually given at various ages, according to a schedule. Your health care provider will recommend vaccines for you based on your age, medical history, and lifestyle or other factors, such as travel or where you work.   What tests do I need? Blood tests  Lipid and cholesterol levels. These may be checked every 5 years, or more often if you are over 25 years old.  Hepatitis C test.  Hepatitis B test. Screening  Lung cancer screening. You may have this screening every year starting at age 80 if you have a 30-pack-year history of smoking and currently  smoke or have quit within the past 15 years.  Colorectal cancer screening. ? All adults should have this screening starting at age 42 and continuing until age 21. ? Your health care provider may recommend screening at age 16 if you are at increased risk. ? You will have tests every 1-10 years, depending on your results and the type of screening test.  Diabetes screening. ? This is done by checking your blood sugar (glucose) after you have not eaten for a while (fasting). ? You may have this done every 1-3 years.  Mammogram. ? This may be done every 1-2 years. ? Talk with your health care provider about when you should start having regular mammograms. This may depend on whether you have a family history of breast cancer.  BRCA-related cancer screening. This may be done if you have a family history of breast, ovarian, tubal, or peritoneal cancers.  Pelvic exam and Pap test. ? This may be done every 3 years starting at age 32. ? Starting at age 63, this may be done every 5 years if you have a Pap test in combination with an HPV test. Other tests  STD (sexually transmitted disease) testing, if you are at risk.  Bone density scan. This is done to screen for osteoporosis. You may have this scan if you are at high risk for osteoporosis. Talk with your health care provider about your test results, treatment options, and if necessary, the need for more tests. Follow these instructions at home: Eating and drinking  Eat a diet that includes fresh fruits and vegetables,  whole grains, lean protein, and low-fat dairy products.  Take vitamin and mineral supplements as recommended by your health care provider.  Do not drink alcohol if: ? Your health care provider tells you not to drink. ? You are pregnant, may be pregnant, or are planning to become pregnant.  If you drink alcohol: ? Limit how much you have to 0-1 drink a day. ? Be aware of how much alcohol is in your drink. In the [place] one  drink equals one 12 oz bottle of beer (355 mL), one 5 oz glass of wine (148 mL), or one 1 oz glass of hard liquor (44 mL).   Lifestyle  Take daily care of your teeth and gums. Brush your teeth every morning and night with fluoride toothpaste. Floss one time each day.  Stay active. Exercise for at least 30 minutes 5 or more days each week.  Do not use any products that contain nicotine or tobacco, such as cigarettes, e-cigarettes, and chewing tobacco. If you need help quitting, ask your health care provider.  Do not use drugs.  If you are sexually active, practice safe sex. Use a condom or other form of protection to prevent STIs (sexually transmitted infections).  If you do not wish to become pregnant, use a form of birth control. If you plan to become pregnant, see your health care provider for a prepregnancy visit.  If told by your health care provider, take low-dose aspirin daily starting at age 74.  Find healthy ways to cope with stress, such as: ? Meditation, yoga, or listening to music. ? Journaling. ? Talking to a trusted person. ? Spending time with friends and family. Safety  Always wear your seat belt while driving or riding in a vehicle.  Do not drive: ? If you have been drinking alcohol. Do not ride with someone who has been drinking. ? When you are tired or distracted. ? While texting.  Wear a helmet and other protective equipment during sports activities.  If you have firearms in your house, make sure you follow all gun safety procedures. What's next?  Visit your health care provider once a year for an annual wellness visit.  Ask your health care provider how often you should have your eyes and teeth checked.  Stay up to date on all vaccines. This information is not intended to replace advice given to you by your health care provider. Make sure you discuss any questions you have with your health care provider. Document Revised: 01/27/2020 Document Reviewed:  01/03/2018 Elsevier Patient Education  2021 [employer].

## 2020-07-02 NOTE — Progress Notes (Signed)
Patient: Beth Frey MRN: [id number] DOB: 08-05-1955 PCP: Orma Flaming, MD     Subjective:  Chief Complaint  Patient presents with  . Annual Exam  . Hyperlipidemia  . Hypertension  . Prediabetes    HPI: The patient is a 65 y.o. female who presents today for annual exam and follow up of chronic medical conditions. She denies any changes to past medical history. There have been no recent hospitalizations. They is following a well balanced diet and exercise plan. She says she has to eat soft foods due to jaw pain. She walks daily.  Weight has been decreasing.   No family history of colon or breast cancer in first degree relative.   Prediabetes a1c was 5.7 back in 11/05/19. She has not been able to eat. Needs TMJ replaced and trying to find surgeon to do this.   Hyperlipidemia She is currently on crestor 10mg /daily. Has not been eating well due to needing jaw replacement surgery. No problems with medication. Not exercising a lot.   Anxiety/depression She is on 25mg /daily. She is doing well. Had weaned off of this, but started it back up recently.   Hx of hypertension Was on medication, but weaned off and blood pressure has been normal to low.    Immunization History  Administered Date(s) Administered  . Tdap 10/20/2013   Colonoscopy: 07/01/2019 Mammogram:  05/17/2020 Pap smear: 12/07/2017. Normal    Review of Systems  Constitutional: Negative for chills, fatigue and fever.  HENT: Negative for dental problem, ear pain, hearing loss and trouble swallowing.   Eyes: Negative for visual disturbance.  Respiratory: Negative for cough, chest tightness and shortness of breath.   Cardiovascular: Negative for chest pain, palpitations and leg swelling.  Gastrointestinal: Negative for abdominal pain, blood in stool, diarrhea and nausea.  Endocrine: Negative for cold intolerance, polydipsia, polyphagia and polyuria.  Genitourinary: Negative for dysuria and hematuria.  Musculoskeletal:  Positive for arthralgias.       Jaw pain   Skin: Negative for rash.  Neurological: Negative for dizziness and headaches.  Psychiatric/Behavioral: Negative for dysphoric mood and sleep disturbance. The patient is not nervous/anxious.     Allergies Patient is allergic to fish allergy, sulfa antibiotics, doxycycline, and trazodone and nefazodone.  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  Surgical History Patient  has a past surgical history that includes Wrist surgery (2009); Elbow surgery; Ovarian cyst removal; Skin cancer excision; Uterine ablation; Tubal ligation; Colon surgery (02/20/2010); Joint replacement (2006); Breast surgery (August 2011); and Augmentation mammaplasty.  Family History Pateint's family history includes Cancer in her brother, brother, father, maternal grandfather, mother, and paternal grandmother; Heart disease in her paternal grandfather; Parkinson's disease in her mother; Stroke in her maternal grandmother.  Social History Patient  reports that she has never smoked. She has never used smokeless tobacco. She reports that she does not drink alcohol and does not use drugs.    Objective: Vitals:   07/02/20 0949  BP: 118/70  Pulse: 92  Temp: 97.8 F (36.6 C)  TempSrc: Temporal  SpO2: 100%  Weight: 139 lb 3.2 oz (63.1 kg)  Height: 5\' 8"  (1.727 m)    Body mass index is 21.17 kg/m.  Physical Exam Vitals reviewed.  Constitutional:      Appearance: Normal appearance. She is well-developed, normal weight and well-nourished.  HENT:     Head: Normocephalic and atraumatic.  Right Ear: Tympanic membrane, ear canal and external ear normal.     Left Ear: Tympanic membrane, ear canal and external ear normal.     Mouth/Throat:     Mouth: Oropharynx is clear and moist.     Comments: Clicking in jaw. Hard for her to talk due to  pain  Eyes:     Extraocular Movements: EOM normal.     Conjunctiva/sclera: Conjunctivae normal.     Pupils: Pupils are equal, round, and reactive to light.  Neck:     Thyroid: No thyromegaly.     Vascular: No carotid bruit.  Cardiovascular:     Rate and Rhythm: Normal rate and regular rhythm.     Pulses: Normal pulses and intact distal pulses.     Heart sounds: Normal heart sounds. No murmur heard.   Pulmonary:     Effort: Pulmonary effort is normal.     Breath sounds: Normal breath sounds.  Abdominal:     General: Abdomen is flat. Bowel sounds are normal. There is no distension.     Palpations: Abdomen is soft.     Tenderness: There is no abdominal tenderness.  Musculoskeletal:     Cervical back: Normal range of motion and neck supple.  Lymphadenopathy:     Cervical: No cervical adenopathy.  Skin:    General: Skin is warm and dry.     Capillary Refill: Capillary refill takes less than 2 seconds.     Findings: No rash.  Neurological:     General: No focal deficit present.     Mental Status: She is alert and oriented to person, place, and time.     Cranial Nerves: No cranial nerve deficit.     Coordination: Coordination normal.     Deep Tendon Reflexes: Reflexes normal.  Psychiatric:        Mood and Affect: Mood and affect and mood normal.        Behavior: Behavior normal.        [facility] Office Visit from 07/02/2020 in [place]  PHQ-2 Total Score 0      Assessment/plan: 1. Annual physical exam Routine fasting labs today. Hm reviewed and UTD. She has had covid vaccine +had covid. Exercise limited with jaw issues. Diet limited as well. Hopefully can resume after jaw surgery. Overall doing okay despite pain and horrendous inability to eat normal foods wit hTMJ issues.f/u in one year or as needed.  Patient counseling [x]    Nutrition: Stressed importance of moderation in sodium/caffeine intake, saturated fat and cholesterol, caloric  balance, sufficient intake of fresh fruits, vegetables, fiber, calcium, iron, and 1 mg of folate supplement per day (for females capable of pregnancy).  [x]    Stressed the importance of regular exercise.   []    Substance Abuse: Discussed cessation/primary prevention of tobacco, alcohol, or other drug use; driving or other dangerous activities under the influence; availability of treatment for abuse.   [x]    Injury prevention: Discussed safety belts, safety helmets, smoke detector, smoking near bedding or upholstery.   [x]    Sexuality: Discussed sexually transmitted diseases, partner selection, use of condoms, avoidance of unintended pregnancy  and contraceptive alternatives.  [x]    Dental health: Discussed importance of regular tooth brushing, flossing, and dental visits.  [x]    Health maintenance and immunizations reviewed. Please refer to Health maintenance section.     - CBC with Differential/Platelet - Comprehensive metabolic panel - Lipid panel - TSH  2. Benign essential HTN Off medication for awhile  and well controlled. Unsure if she ever had htn as clonidine was for hormones? Weaning off caused some elevated readings, but has done fine since that time. Will continue to monitor.   3. Mixed hyperlipidemia Has been on crestor 10mg . Will recheck today. She is fasting.  The 10-year ASCVD risk score Mikey Bussing DC Jr., et al., 2013) is: 6.2%   4. Vitamin D deficiency Just checked 2 months ago and normal at 40.   5. Prediabetes Last a1c was 5.7 in 6/21. Do not expect big change in this.  - Hemoglobin A1c  6. Temporomandibular joint disorder (TMJ) Really suffering and medically necessary. She can not eat, has lost weight, has low blood pressure, can not talk at times. Seeing another oral Leisure centre manager at [facility] in march. I asked that they let me know if I can do anything for them. On valium and flexeril prn. Does not need refills.    This visit occurred during the SARS-CoV-2 public  health emergency.  Safety protocols were in place, including screening questions prior to the visit, additional usage of staff PPE, and extensive cleaning of exam room while observing appropriate contact time as indicated for disinfecting solutions.     Return in about 1 year (around 07/02/2021).     Orma Flaming, MD [place]  07/02/2020

## 2020-07-30 ENCOUNTER — Encounter: Payer: Self-pay | Admitting: Family Medicine

## 2020-08-02 MED ORDER — CELECOXIB 200 MG PO CAPS
200.0000 mg | ORAL_CAPSULE | Freq: Every day | ORAL | 1 refills | Status: DC
Start: 2020-08-02 — End: 2021-01-24

## 2020-08-02 NOTE — Telephone Encounter (Signed)
Okay to send Celexa for knee pain?

## 2020-08-09 MED ORDER — TEMAZEPAM 15 MG PO CAPS: 15.0000 mg | ORAL_CAPSULE | Freq: Every evening | ORAL | 0 refills | Status: DC | PRN

## 2020-08-09 NOTE — Addendum Note (Signed)
Addended by: Orma Flaming on: 08/09/2020 10:09 PM   Modules accepted: Orders

## 2020-08-09 NOTE — Telephone Encounter (Signed)
Pt requesting Valium 10.5 mg tab LOV: 07/02/20 No future visit scheduled  Approve?

## 2020-08-16 ENCOUNTER — Encounter: Payer: Self-pay | Admitting: Family Medicine

## 2020-08-16 NOTE — Telephone Encounter (Signed)
Can you look at this

## 2020-08-28 ENCOUNTER — Other Ambulatory Visit: Payer: Self-pay | Admitting: Family Medicine

## 2020-10-01 ENCOUNTER — Encounter: Payer: Self-pay | Admitting: Family Medicine

## 2020-10-05 ENCOUNTER — Other Ambulatory Visit: Payer: Self-pay | Admitting: Family Medicine

## 2020-10-19 ENCOUNTER — Encounter: Payer: Self-pay | Admitting: Family Medicine

## 2020-10-19 ENCOUNTER — Ambulatory Visit: Payer: PRIVATE HEALTH INSURANCE | Admitting: Family Medicine

## 2020-10-19 ENCOUNTER — Other Ambulatory Visit: Payer: Self-pay

## 2020-10-19 VITALS — BP 120/78 | HR 76 | Temp 97.9°F | Resp 18 | Ht 68.0 in | Wt 141.2 lb

## 2020-10-19 DIAGNOSIS — E782 Mixed hyperlipidemia: Secondary | ICD-10-CM | POA: Diagnosis not present

## 2020-10-19 DIAGNOSIS — D480 Neoplasm of uncertain behavior of bone and articular cartilage: Secondary | ICD-10-CM

## 2020-10-19 DIAGNOSIS — F419 Anxiety disorder, unspecified: Secondary | ICD-10-CM | POA: Diagnosis not present

## 2020-10-19 DIAGNOSIS — D169 Benign neoplasm of bone and articular cartilage, unspecified: Secondary | ICD-10-CM

## 2020-10-19 DIAGNOSIS — E041 Nontoxic single thyroid nodule: Secondary | ICD-10-CM

## 2020-10-19 DIAGNOSIS — K219 Gastro-esophageal reflux disease without esophagitis: Secondary | ICD-10-CM | POA: Insufficient documentation

## 2020-10-19 DIAGNOSIS — Z86010 Personal history of colonic polyps: Secondary | ICD-10-CM | POA: Insufficient documentation

## 2020-10-19 DIAGNOSIS — Z800 Family history of malignant neoplasm of digestive organs: Secondary | ICD-10-CM | POA: Insufficient documentation

## 2020-10-19 DIAGNOSIS — R195 Other fecal abnormalities: Secondary | ICD-10-CM | POA: Insufficient documentation

## 2020-10-19 NOTE — Patient Instructions (Signed)
Schedule your complete physical in late Feb/early March No need to repeat labs today Call Dr Kennon Rounds to schedule an appt We'll call you with your thyroid US Call with any questions or concerns Welcome!  We're glad to have you! Beth Frey With Your Jaw

## 2020-10-19 NOTE — Progress Notes (Signed)
   Subjective:    Patient ID: Beth Frey, female    DOB: 1955-07-12, 65 y.o.   MRN: [id number]  HPI Hyperlipidemia- chronic problem, on Crestor 10mg  daily.  No abd pain, N/V.  No CP, SOB.  Thyroid nodules- last imaging done 2017.  Has had 2 separate biopsies and both were negative.  Due for repeat US to assess for change.  Anxiety- ongoing issue for pt.  Currently on Sertraline 25mg  daily.  Taking Temazepam or Diazepam as needed for sleep.  Osteochondromatosis- ongoing issue for pt.  She has lost considerable weight due to ongoing jaw issues.  Seeing Dr Evangeline Gula at [facility].  Health Maintenance- declines shingles vaccine.  UTD on COVID, colonoscopy, mammo, pap.   Review of Systems For ROS see HPI   This visit occurred during the SARS-CoV-2 public health emergency.  Safety protocols were in place, including screening questions prior to the visit, additional usage of staff PPE, and extensive cleaning of exam room while observing appropriate contact time as indicated for disinfecting solutions.      Objective:   Physical Exam Vitals reviewed.  Constitutional:      General: She is not in acute distress.    Appearance: Normal appearance. She is not ill-appearing.  HENT:     Head: Normocephalic and atraumatic.  Eyes:     Extraocular Movements: Extraocular movements intact.     Conjunctiva/sclera: Conjunctivae normal.     Pupils: Pupils are equal, round, and reactive to light.  Cardiovascular:     Rate and Rhythm: Normal rate and regular rhythm.     Pulses: Normal pulses.     Heart sounds: Normal heart sounds.  Pulmonary:     Effort: Pulmonary effort is normal.     Breath sounds: Normal breath sounds.  Abdominal:     General: There is no distension.     Palpations: Abdomen is soft. There is no mass.     Tenderness: There is no abdominal tenderness. There is no guarding.  Musculoskeletal:     Cervical back: Normal range of motion and neck supple.     Right lower leg: No edema.      Left lower leg: No edema.  Lymphadenopathy:     Cervical: No cervical adenopathy.  Skin:    General: Skin is warm and dry.  Neurological:     General: No focal deficit present.     Mental Status: She is alert and oriented to person, place, and time.  Psychiatric:        Mood and Affect: Mood normal.        Behavior: Behavior normal.        Thought Content: Thought content normal.          Assessment & Plan:

## 2020-10-22 ENCOUNTER — Ambulatory Visit (HOSPITAL_BASED_OUTPATIENT_CLINIC_OR_DEPARTMENT_OTHER)
Admission: RE | Admit: 2020-10-22 | Discharge: 2020-10-22 | Disposition: A | Discharge status: Home / Self Care | Payer: PRIVATE HEALTH INSURANCE | Source: Ambulatory Visit | Attending: Family Medicine | Admitting: Family Medicine

## 2020-10-22 ENCOUNTER — Other Ambulatory Visit: Payer: Self-pay

## 2020-10-22 DIAGNOSIS — E041 Nontoxic single thyroid nodule: Secondary | ICD-10-CM | POA: Diagnosis not present

## 2020-11-03 ENCOUNTER — Encounter: Payer: Self-pay | Admitting: *Deleted

## 2020-11-04 NOTE — Assessment & Plan Note (Signed)
Following w/ Dr Evangeline Gula at [facility].  Will follow along.

## 2020-11-04 NOTE — Assessment & Plan Note (Signed)
Pt had 2 separate biopsies that were negative.  Last imaging was done in 2017 and according to the report, she is to have another [place] to assess for growth or change.  [place] ordered

## 2020-11-04 NOTE — Assessment & Plan Note (Signed)
Chronic problem.  On Crestor w/o difficulty.  Reviewed recent labs.  No need to repeat at this time

## 2020-11-04 NOTE — Assessment & Plan Note (Signed)
Chronic problem.  Currently on Sertraline 25mg  daily and feels this is adequate.  Not interested in changing meds at this time.  Takes either Temazepam or Diazepam as needed for sleep.  Will continue to monitor.

## 2020-11-09 ENCOUNTER — Other Ambulatory Visit: Payer: Self-pay | Admitting: Family Medicine

## 2020-11-09 ENCOUNTER — Encounter: Payer: Self-pay | Admitting: Family Medicine

## 2020-11-22 ENCOUNTER — Other Ambulatory Visit: Payer: Self-pay

## 2021-01-14 ENCOUNTER — Telehealth: Payer: Self-pay | Admitting: Family Medicine

## 2021-01-14 NOTE — Telephone Encounter (Signed)
Patient needs refill on her Zofran - [facility] [address] - [place]

## 2021-01-18 NOTE — Telephone Encounter (Signed)
Called and left a message for patient to return call regarding her concerns of Rx of Zofran.

## 2021-01-23 ENCOUNTER — Other Ambulatory Visit: Payer: Self-pay | Admitting: Family Medicine

## 2021-01-24 ENCOUNTER — Other Ambulatory Visit: Payer: Self-pay

## 2021-01-24 MED ORDER — CELECOXIB 200 MG PO CAPS: 200.0000 mg | ORAL_CAPSULE | Freq: Every day | ORAL | 1 refills | Status: DC

## 2021-05-10 ENCOUNTER — Other Ambulatory Visit: Payer: Self-pay | Admitting: Family Medicine

## 2021-05-10 ENCOUNTER — Encounter: Payer: Self-pay | Admitting: Family Medicine

## 2021-05-10 DIAGNOSIS — Z1231 Encounter for screening mammogram for malignant neoplasm of breast: Secondary | ICD-10-CM

## 2021-05-25 DIAGNOSIS — Z1231 Encounter for screening mammogram for malignant neoplasm of breast: Secondary | ICD-10-CM

## 2021-05-31 ENCOUNTER — Ambulatory Visit: Payer: PRIVATE HEALTH INSURANCE | Admitting: Family Medicine

## 2021-05-31 ENCOUNTER — Encounter: Payer: Self-pay | Admitting: Family Medicine

## 2021-05-31 VITALS — BP 128/78 | HR 93 | Temp 98.1°F | Resp 17 | Ht 68.0 in | Wt 142.0 lb

## 2021-05-31 DIAGNOSIS — E782 Mixed hyperlipidemia: Secondary | ICD-10-CM | POA: Diagnosis not present

## 2021-05-31 DIAGNOSIS — F5102 Adjustment insomnia: Secondary | ICD-10-CM | POA: Diagnosis not present

## 2021-05-31 LAB — BASIC METABOLIC PANEL
BUN: 12 mg/dL (ref 6–23)
CO2: 28 mEq/L (ref 19–32)
Calcium: 9.8 mg/dL (ref 8.4–10.5)
Chloride: 99 mEq/L (ref 96–112)
Creatinine, Ser: 0.72 mg/dL (ref 0.40–1.20)
GFR: 88.18 mL/min (ref >60.00)
Glucose, Bld: 84 mg/dL (ref 70–99)
Potassium: 3.9 mEq/L (ref 3.5–5.1)
Sodium: 140 mEq/L (ref 135–145)

## 2021-05-31 LAB — TSH: TSH: 1.04 u[IU]/mL (ref 0.35–5.50)

## 2021-05-31 LAB — CBC WITH DIFFERENTIAL/PLATELET
Basophils Absolute: 0 10*3/uL (ref 0.0–0.1)
Basophils Relative: 0.5 % (ref 0.0–3.0)
Eosinophils Absolute: 0.3 10*3/uL (ref 0.0–0.7)
Eosinophils Relative: 4.1 % (ref 0.0–5.0)
HCT: 42.1 % (ref 36.0–46.0)
Hemoglobin: 13.7 g/dL (ref 12.0–15.0)
Lymphocytes Relative: 31.2 % (ref 12.0–46.0)
Lymphs Abs: 2.2 10*3/uL (ref 0.7–4.0)
MCHC: 32.5 g/dL (ref 30.0–36.0)
MCV: 93.3 fl (ref 78.0–100.0)
Monocytes Absolute: 0.6 10*3/uL (ref 0.1–1.0)
Monocytes Relative: 8.1 % (ref 3.0–12.0)
Neutro Abs: 4 10*3/uL (ref 1.4–7.7)
Neutrophils Relative %: 56.1 % (ref 43.0–77.0)
Platelets: 222 10*3/uL (ref 150.0–400.0)
RBC: 4.51 Mil/uL (ref 3.87–5.11)
RDW: 13.1 % (ref 11.5–15.5)
WBC: 7.1 10*3/uL (ref 4.0–10.5)

## 2021-05-31 LAB — LIPID PANEL
Cholesterol: 174 mg/dL (ref 0–200)
HDL: 97.7 mg/dL (ref >39.00)
LDL Cholesterol: 65 mg/dL (ref 0–99)
NonHDL: 76.71
Total CHOL/HDL Ratio: 2
Triglycerides: 61 mg/dL (ref 0.0–149.0)
VLDL: 12.2 mg/dL (ref 0.0–40.0)

## 2021-05-31 LAB — HEPATIC FUNCTION PANEL
ALT: 28 U/L (ref 0–35)
AST: 27 U/L (ref 0–37)
Albumin: 4.6 g/dL (ref 3.5–5.2)
Alkaline Phosphatase: 39 U/L (ref 39–117)
Bilirubin, Direct: 0.2 mg/dL (ref 0.0–0.3)
Total Bilirubin: 0.8 mg/dL (ref 0.2–1.2)
Total Protein: 7.2 g/dL (ref 6.0–8.3)

## 2021-05-31 MED ORDER — TEMAZEPAM 15 MG PO CAPS: 15.0000 mg | ORAL_CAPSULE | Freq: Every evening | ORAL | 3 refills | Status: DC | PRN

## 2021-05-31 NOTE — Patient Instructions (Signed)
Follow up as scheduled We'll notify you of your lab results and make any changes if needed Restart the Temazepam nightly for sleep Don't take the Diazepam or Ativan at the same time as the Temazepam Call with any questions or concerns Stay Safe!  Stay Healthy! Hang in there!!

## 2021-05-31 NOTE — Assessment & Plan Note (Signed)
Deteriorated.  Pt is having a hard time sleeping b/c of night shift and ongoing jaw issues that require her to sleep in the recliner.  She reports that the Temazepam was helpful in the past but she ran out of this around the time she had surgery and never had it refilled.  Will restart Temazepam as this is a known entity for pt rather than starting something new.  If this is not effective or she has side effects, will consider Belsomra or other sleep med.  Pt expressed understanding and is in agreement w/ plan.

## 2021-05-31 NOTE — Progress Notes (Signed)
° °  Subjective:    Patient ID: Beth Frey, female    DOB: 1955/07/05, 66 y.o.   MRN: [id number]  HPI Insomnia- pt works the night shift, has to sleep in a recliner due to jaw surgery.  Says she is able to fall asleep but is typically up in 45-60 minutes.  Difficulty getting back to sleep.  Takes Flexeril nightly for her jaw pain and she thinks that helps her fall asleep.  Has some Diazepam remaining and that helps w/ sleep but she takes it sparingly.  Was previously on temazepam and pt reports this was helpful.  Spends a lot of time at [employer]- where mom is located.  Denies current anxiety or depression.   Review of Systems For ROS see HPI   This visit occurred during the SARS-CoV-2 public health emergency.  Safety protocols were in place, including screening questions prior to the visit, additional usage of staff PPE, and extensive cleaning of exam room while observing appropriate contact time as indicated for disinfecting solutions.      Objective:   Physical Exam Vitals reviewed.  Constitutional:      General: She is not in acute distress.    Appearance: Normal appearance. She is not ill-appearing.  HENT:     Head: Normocephalic and atraumatic.  Eyes:     Extraocular Movements: Extraocular movements intact.     Conjunctiva/sclera: Conjunctivae normal.     Pupils: Pupils are equal, round, and reactive to light.  Pulmonary:     Effort: Pulmonary effort is normal. No respiratory distress.  Musculoskeletal:     Right lower leg: No edema.     Left lower leg: No edema.  Skin:    General: Skin is warm and dry.  Neurological:     General: No focal deficit present.     Mental Status: She is alert and oriented to person, place, and time.  Psychiatric:        Mood and Affect: Mood normal.        Behavior: Behavior normal.        Thought Content: Thought content normal.          Assessment & Plan:

## 2021-05-31 NOTE — Assessment & Plan Note (Signed)
Check labs for upcoming physical

## 2021-06-01 ENCOUNTER — Other Ambulatory Visit (HOSPITAL_BASED_OUTPATIENT_CLINIC_OR_DEPARTMENT_OTHER): Payer: Self-pay | Admitting: Family Medicine

## 2021-06-01 DIAGNOSIS — Z1231 Encounter for screening mammogram for malignant neoplasm of breast: Secondary | ICD-10-CM

## 2021-06-02 ENCOUNTER — Encounter (HOSPITAL_BASED_OUTPATIENT_CLINIC_OR_DEPARTMENT_OTHER): Payer: Self-pay

## 2021-06-02 ENCOUNTER — Ambulatory Visit (HOSPITAL_BASED_OUTPATIENT_CLINIC_OR_DEPARTMENT_OTHER)
Admission: RE | Admit: 2021-06-02 | Discharge: 2021-06-02 | Disposition: A | Discharge status: Home / Self Care | Payer: PRIVATE HEALTH INSURANCE | Source: Ambulatory Visit | Attending: Family Medicine | Admitting: Family Medicine

## 2021-06-02 ENCOUNTER — Other Ambulatory Visit: Payer: Self-pay

## 2021-06-02 DIAGNOSIS — Z1231 Encounter for screening mammogram for malignant neoplasm of breast: Secondary | ICD-10-CM | POA: Insufficient documentation

## 2021-06-27 ENCOUNTER — Other Ambulatory Visit: Payer: Self-pay

## 2021-06-27 ENCOUNTER — Ambulatory Visit (INDEPENDENT_AMBULATORY_CARE_PROVIDER_SITE_OTHER): Payer: PRIVATE HEALTH INSURANCE | Admitting: Obstetrics and Gynecology

## 2021-06-27 ENCOUNTER — Encounter: Payer: Self-pay | Admitting: Obstetrics and Gynecology

## 2021-06-27 VITALS — BP 125/73 | HR 91 | Wt 146.0 lb

## 2021-06-27 DIAGNOSIS — Z01419 Encounter for gynecological examination (general) (routine) without abnormal findings: Secondary | ICD-10-CM | POA: Diagnosis not present

## 2021-06-27 NOTE — Progress Notes (Signed)
GYNECOLOGY ANNUAL PREVENTATIVE CARE ENCOUNTER NOTE  History:     Beth Frey is a 66 y.o. G0P0000 female here for a routine annual gynecologic exam.  Current complaints: none.     Not sexually active. Spouse is ER doc.   Denies abnormal vaginal bleeding, discharge, pelvic pain, problems with intercourse or other gynecologic concerns.     Gynecologic History No LMP recorded (lmp unknown). Patient is postmenopausal.  Last Pap: 12/2017. Result was normal with normal HPV Last Mammogram: 05/2021.  Result was normal Last Colonoscopy: 2021.  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[place])    Osteoarthritis     Past Surgical History:  Procedure Laterality Date   AUGMENTATION MAMMAPLASTY     Pt has had explants   BREAST SURGERY  August 2011   Implants removed   COLON SURGERY  02/20/2010   Right hemicolectomy-cecal bascule   ELBOW SURGERY     left elbow   JOINT REPLACEMENT  2006   Left total knee replacement   OVARIAN CYST REMOVAL     right   SKIN CANCER EXCISION     multiple-SCC and BCC   TUBAL LIGATION     Uterine ablation     WRIST SURGERY  2009   right wrist    Current Outpatient Medications on File Prior to Visit  Medication Sig Dispense Refill   celecoxib (CELEBREX) 200 MG capsule Take 1 capsule (200 mg total) by mouth daily. 90 capsule 1   cyclobenzaprine (FLEXERIL) 10 MG tablet Take 1 tablet (10 mg total) by mouth at bedtime. 60 tablet 1   diazepam (VALIUM) 10 MG tablet diazepam 10 mg tablet  TAKE 1 TABLET (10 MG TOTAL) BY MOUTH AT BEDTIME AS NEEDED FOR ANXIETY OR SLEEP.     Multiple Vitamin (MULTIVITAMIN WITH MINERALS) TABS tablet Take 1 tablet by mouth daily.     potassium  chloride (MICRO-K) 10 MEQ CR capsule TAKE 1 CAPSULE (10 MEQ TOTAL) BY MOUTH 3 (THREE) TIMES DAILY. 270 capsule 2   rosuvastatin (CRESTOR) 10 MG tablet TAKE 1 TABLET BY MOUTH EVERY DAY 90 tablet 3   sertraline (ZOLOFT) 50 MG tablet TAKE 1 TABLET BY MOUTH EVERY DAY 90 tablet 3   temazepam (RESTORIL) 15 MG capsule Take 1 capsule (15 mg total) by mouth at bedtime as needed for sleep. 30 capsule 3   No current facility-administered medications on file prior to visit.    Allergies  Allergen Reactions   Fish Allergy Anaphylaxis   Sulfa Antibiotics Itching and Swelling    Throat, body swelling   Doxycycline     Sun    Trazodone And Nefazodone Other (See Comments)    Serotonin syndrome d/t other medications   Vancomycin Rash    Social History:  reports that she has never smoked. She has never used smokeless tobacco. She reports that she does not drink alcohol and does not use drugs.  Family History  Problem Relation Age of Onset   Cancer Mother        multiple myeloma   Parkinson's disease Mother  Cancer Father        stomach cancer   Cancer Brother        pancreatic cancer   Stroke Maternal Grandmother    Cancer Maternal Grandfather        lung cancer   Cancer Paternal Grandmother        ovarian   Heart disease Paternal Grandfather    Cancer Brother        multiple myeloma    The following portions of the patient's history were reviewed and updated as appropriate: allergies, current medications, past family history, past medical history, past social history, past surgical history and problem list.  Review of Systems Pertinent items noted in HPI and remainder of comprehensive ROS otherwise negative.  Physical Exam:  BP 125/73    Pulse 91    Wt 146 lb (66.2 kg)    LMP  (LMP Unknown)    BMI 22.20 kg/m  CONSTITUTIONAL: Well-developed, well-nourished female in no acute distress.  HENT:  Normocephalic, atraumatic, External right and left ear normal.  EYES: Conjunctivae and EOM  are normal. Pupils are equal, round, and reactive to light. No scleral icterus.  NECK: Normal range of motion, supple, no masses.  Normal thyroid.  SKIN: Skin is warm and dry. No rash noted. Not diaphoretic. No erythema. No pallor. MUSCULOSKELETAL: Normal range of motion. No tenderness.  No cyanosis, clubbing, or edema. NEUROLOGIC: Alert and oriented to person, place, and time. Normal reflexes, muscle tone coordination.  PSYCHIATRIC: Normal mood and affect. Normal behavior. Normal judgment and thought content.  CARDIOVASCULAR: Normal heart rate noted, regular rhythm RESPIRATORY: Clear to auscultation bilaterally. Effort and breath sounds normal, no problems with respiration noted.  BREASTS: Symmetric in size. No masses, tenderness, skin changes, nipple drainage, or lymphadenopathy bilaterally. Performed in the presence of a chaperone. ABDOMEN: Soft, no distention noted.  No tenderness, rebound or guarding.  PELVIC: External genitalia normal, Vagina normal without discharge, Urethra without abnormality or discharge, no bladder tenderness, cervix normal in appearance, no CMT, uterus normal size, shape, and consistency, no adnexal masses or tenderness. Performed in the presence of a chaperone.  Assessment and Plan:    1. Encounter for annual routine gynecological examination - Cervical cancer screening: Discussed guidelines. Pap with HPV done and normal 12/2017.  - Breast Health: Encouraged self breast awareness/SBE. Discussed limits of clinical breast exam for detecting breast cancer. Discussed importance of annual MXR.  Discussed [place] of ovarian ca (pgm) and pancreatic cancer (brother). She will consider genetic screening - Climacteric/Sexual health: Reviewed typical and atypical symptoms of menopause/peri-menopause. Discussed PMB and to call if any amount of spotting.  - Bone Health: Calcium via diet and supplementation. Discussed weight bearing exercise. DEXA due but pt will consider if she wishes  to have this done. Given her other medical conditions, would see endo who does osteo for management.  - Colonoscopy: up to date - due in 2024 - F/U 12 months and prn    Routine preventative health maintenance measures emphasized. Please refer to After Visit Summary for other counseling recommendations.   Radene Gunning, MD, [place], [place]

## 2021-07-06 ENCOUNTER — Encounter: Payer: Self-pay | Admitting: Family Medicine

## 2021-07-06 MED ORDER — TEMAZEPAM 15 MG PO CAPS
15.0000 mg | ORAL_CAPSULE | Freq: Every evening | ORAL | 3 refills | Status: DC | PRN
Start: 2021-07-06 — End: 2021-10-13

## 2021-07-11 ENCOUNTER — Ambulatory Visit (INDEPENDENT_AMBULATORY_CARE_PROVIDER_SITE_OTHER): Payer: PRIVATE HEALTH INSURANCE | Admitting: Family Medicine

## 2021-07-11 ENCOUNTER — Encounter: Payer: Self-pay | Admitting: Family Medicine

## 2021-07-11 DIAGNOSIS — Z0000 Encounter for general adult medical examination without abnormal findings: Secondary | ICD-10-CM

## 2021-07-11 NOTE — Assessment & Plan Note (Signed)
Pt's PE WNL.  UTD on pap, mammo, colonoscopy, Tdap.  Reviewed labs done at last visit.  No need to repeat.  Anticipatory guidance provided.  ?

## 2021-07-11 NOTE — Progress Notes (Signed)
? ?Subjective:  ? ? Patient ID: Beth Frey, female    DOB: 1955-05-25, 66 y.o.   MRN: [id number] ? ?HPI ?CPE- UTD on pap (good for 5 yrs w/ HPV co-testing).  UTD on mammo, colonoscopy, Tdap.  Brother died of pancreatic cancer, PGM died of ovarian cancer.  Pt is questioning whether she needs genetic testing. ? ?Patient Care Team  ?  Relationship Specialty Notifications Start End  ?Midge Minium, MD PCP - General Family Medicine  10/19/20   ?Ronald Lobo, MD  Gastroenterology  11/23/11   ?Dohmeier, Asencion Partridge, MD  Neurology  11/23/11   ?Iran Planas, MD  Orthopedic Surgery  11/23/11   ?Williford, Layne Benton, MD Referring Physician Dermatology  10/19/20   ?Lynden Ang, MD  General Practice  10/19/20   ?  ? ?Health Maintenance  ?Topic Date Due  ? COVID-19 Vaccine (3 - Pfizer risk series) 03/07/2020  ? PAP SMEAR-Modifier  12/07/2020  ? INFLUENZA VACCINE  08/05/2021 (Originally 12/06/2020)  ? Zoster Vaccines- Shingrix (1 of 2) 10/11/2021 (Originally 09/16/1975)  ? MAMMOGRAM  06/03/2023  ? TETANUS/TDAP  10/21/2023  ? COLONOSCOPY (Pts 45-46yr Insurance coverage will need to be confirmed)  06/30/2029  ? Hepatitis C Screening  Completed  ? HIV Screening  Completed  ? HPV VACCINES  Aged Out  ?  ? ? ?Review of Systems ?Patient reports no vision/ hearing changes, adenopathy,fever, weight change,  persistant/recurrent hoarseness , swallowing issues, chest pain, palpitations, edema, persistant/recurrent cough, hemoptysis, dyspnea (rest/exertional/paroxysmal nocturnal), gastrointestinal bleeding (melena, rectal bleeding), abdominal pain, significant heartburn, bowel changes, GU symptoms (dysuria, hematuria, incontinence), Gyn symptoms (abnormal  bleeding, pain),  syncope, focal weakness, memory loss, numbness & tingling, skin/hair/nail changes, abnormal bruising or bleeding, anxiety, or depression.  ? ?This visit occurred during the SARS-CoV-2 public health emergency.  Safety protocols were in place, including screening questions  prior to the visit, additional usage of staff PPE, and extensive cleaning of exam room while observing appropriate contact time as indicated for disinfecting solutions.   ?   ?Objective:  ? Physical Exam ?General Appearance:    Alert, cooperative, no distress, appears stated age  ?Head:    Normocephalic, without obvious abnormality, atraumatic  ?Eyes:    PERRL, conjunctiva/corneas clear, EOM's intact, fundi  ?  benign, both eyes  ?Ears:    Normal TM's and external ear canals, both ears  ?Nose:   Nares normal, septum midline, mucosa normal, no drainage  ?  or sinus tenderness  ?Throat:   Deferred due to COVID  ?Neck:   Supple, symmetrical, trachea midline, no adenopathy;  ?  Thyroid: no enlargement/tenderness/nodules  ?Back:     Symmetric, no curvature, ROM normal, no CVA tenderness  ?Lungs:     Clear to auscultation bilaterally, respirations unlabored  ?Chest Wall:    No tenderness or deformity  ? Heart:    Regular rate and rhythm, S1 and S2 normal, no murmur, rub ?  or gallop  ?Breast Exam:    Deferred to mammo  ?Abdomen:     Soft, non-tender, bowel sounds active all four quadrants,  ?  no masses, no organomegaly  ?Genitalia:    Deferred  ?Rectal:    ?Extremities:   Extremities normal, atraumatic, no cyanosis or edema  ?Pulses:   2+ and symmetric all extremities  ?Skin:   Skin color, texture, turgor normal, no rashes or lesions  ?Lymph nodes:   Cervical, supraclavicular, and axillary nodes normal  ?Neurologic:   CNII-XII intact, normal strength, sensation and reflexes  ?  throughout  ?  ? ? ? ?   ?Assessment & Plan:  ? ? ?

## 2021-07-11 NOTE — Patient Instructions (Addendum)
Follow up in 1 year or as needed ?Your labs look great!!!  No need to repeat ?Keep up the good work!  You look great! ?If you change your mind about genetic testing- let me know! ?Call with any questions or concerns ?Stay Safe!  Stay Healthy! ?Happy Spring!! ? ?

## 2021-08-05 ENCOUNTER — Other Ambulatory Visit: Payer: Self-pay | Admitting: Registered Nurse

## 2021-08-05 ENCOUNTER — Encounter: Payer: Self-pay | Admitting: Family Medicine

## 2021-08-05 DIAGNOSIS — F419 Anxiety disorder, unspecified: Secondary | ICD-10-CM

## 2021-08-05 MED ORDER — SERTRALINE HCL 50 MG PO TABS: 50.0000 mg | ORAL_TABLET | Freq: Every day | ORAL | 1 refills | Status: DC

## 2021-08-09 ENCOUNTER — Other Ambulatory Visit: Payer: Self-pay | Admitting: Family Medicine

## 2021-09-18 ENCOUNTER — Encounter: Payer: Self-pay | Admitting: Family Medicine

## 2021-10-13 ENCOUNTER — Encounter: Payer: Self-pay | Admitting: Family Medicine

## 2021-10-13 ENCOUNTER — Other Ambulatory Visit: Payer: Self-pay

## 2021-10-13 DIAGNOSIS — D169 Benign neoplasm of bone and articular cartilage, unspecified: Secondary | ICD-10-CM

## 2021-10-13 MED ORDER — TEMAZEPAM 15 MG PO CAPS: 15.0000 mg | ORAL_CAPSULE | Freq: Every evening | ORAL | 3 refills | Status: DC | PRN

## 2021-10-17 ENCOUNTER — Telehealth: Payer: Self-pay

## 2021-10-17 MED ORDER — TEMAZEPAM 30 MG PO CAPS
30.0000 mg | ORAL_CAPSULE | Freq: Every evening | ORAL | 3 refills | Status: DC | PRN
Start: 2021-10-17 — End: 2022-02-20

## 2021-10-17 NOTE — Telephone Encounter (Signed)
Informed pt that the pharmacy had to order the medication and It is ready now .Spoke w/ the pharmacist

## 2021-10-17 NOTE — Telephone Encounter (Signed)
Pt called in stating her Restoril had been increased to 30 mg and she is trying to get Rx filled that reflects new dose

## 2021-10-17 NOTE — Telephone Encounter (Signed)
Prescription changed to reflect higher dose and sent to pharmacy

## 2021-10-24 ENCOUNTER — Ambulatory Visit (INDEPENDENT_AMBULATORY_CARE_PROVIDER_SITE_OTHER): Payer: PRIVATE HEALTH INSURANCE | Admitting: Family Medicine

## 2021-10-24 ENCOUNTER — Encounter: Payer: Self-pay | Admitting: Family Medicine

## 2021-10-24 VITALS — BP 110/68 | HR 86 | Temp 97.1°F | Resp 16 | Ht 68.0 in | Wt 149.4 lb

## 2021-10-24 DIAGNOSIS — Z01818 Encounter for other preprocedural examination: Secondary | ICD-10-CM

## 2021-10-24 LAB — HEPATIC FUNCTION PANEL
ALT: 37 U/L — ABNORMAL HIGH (ref 0–35)
AST: 37 U/L (ref 0–37)
Albumin: 4.6 g/dL (ref 3.5–5.2)
Alkaline Phosphatase: 33 U/L — ABNORMAL LOW (ref 39–117)
Bilirubin, Direct: 0.1 mg/dL (ref 0.0–0.3)
Total Bilirubin: 0.7 mg/dL (ref 0.2–1.2)
Total Protein: 7.7 g/dL (ref 6.0–8.3)

## 2021-10-24 LAB — BASIC METABOLIC PANEL
BUN: 10 mg/dL (ref 6–23)
CO2: 28 mEq/L (ref 19–32)
Calcium: 9.9 mg/dL (ref 8.4–10.5)
Chloride: 103 mEq/L (ref 96–112)
Creatinine, Ser: 0.88 mg/dL (ref 0.40–1.20)
GFR: 69.12 mL/min (ref >60.00)
Glucose, Bld: 103 mg/dL — ABNORMAL HIGH (ref 70–99)
Potassium: 4.1 mEq/L (ref 3.5–5.1)
Sodium: 140 mEq/L (ref 135–145)

## 2021-10-24 LAB — CBC WITH DIFFERENTIAL/PLATELET
Basophils Absolute: 0 10*3/uL (ref 0.0–0.1)
Basophils Relative: 0.6 % (ref 0.0–3.0)
Eosinophils Absolute: 0.3 10*3/uL (ref 0.0–0.7)
Eosinophils Relative: 3.8 % (ref 0.0–5.0)
HCT: 40.5 % (ref 36.0–46.0)
Hemoglobin: 13.3 g/dL (ref 12.0–15.0)
Lymphocytes Relative: 32.2 % (ref 12.0–46.0)
Lymphs Abs: 2.3 10*3/uL (ref 0.7–4.0)
MCHC: 32.7 g/dL (ref 30.0–36.0)
MCV: 94.9 fl (ref 78.0–100.0)
Monocytes Absolute: 0.6 10*3/uL (ref 0.1–1.0)
Monocytes Relative: 8.1 % (ref 3.0–12.0)
Neutro Abs: 4 10*3/uL (ref 1.4–7.7)
Neutrophils Relative %: 55.3 % (ref 43.0–77.0)
Platelets: 244 10*3/uL (ref 150.0–400.0)
RBC: 4.27 Mil/uL (ref 3.87–5.11)
RDW: 14.1 % (ref 11.5–15.5)
WBC: 7.3 10*3/uL (ref 4.0–10.5)

## 2021-10-24 LAB — TSH: TSH: 1.27 u[IU]/mL (ref 0.35–5.50)

## 2021-10-24 LAB — APTT: aPTT: 30 s (ref 25.4–36.8)

## 2021-10-24 LAB — HEMOGLOBIN A1C: Hgb A1c MFr Bld: 5.7 % (ref 4.6–6.5)

## 2021-10-24 LAB — PROTIME-INR
INR: 1 ratio (ref 0.8–1.0)
Prothrombin Time: 10.6 s (ref 9.6–13.1)

## 2021-10-24 NOTE — Patient Instructions (Signed)
Follow up as needed or as scheduled We'll notify you of your lab results and make any changes if needed [facility] fax your lab results, office note, EKG to your surgeon for approval Keep up the good work!  You look great! Call with any questions or concerns GOOD LUCK!!!

## 2021-10-24 NOTE — Telephone Encounter (Signed)
Do you know anything about this paperwork for this patient?

## 2021-10-24 NOTE — Progress Notes (Signed)
Subjective:    Beth Frey is a 66 y.o. female who presents to the office today for a preoperative consultation at the request of surgeon Dr Ermalene Searing who plans on performing facelift on August 10. This consultation is requested for the specific conditions prompting preoperative evaluation (i.e. because of potential affect on operative risk): LBBB. Planned anesthesia: general. The patient has the following known anesthesia issues:  none . Patients bleeding risk: no recent abnormal bleeding, no remote history of abnormal bleeding, and current use of NSAIDs . Patient does not have objections to receiving blood products if needed.  The following portions of the patient's history were reviewed and updated as appropriate: allergies, current medications, past family history, past medical history, past social history, past surgical history, and problem list.  Review of Systems A comprehensive review of systems was negative.    Objective:    BP 110/68   Pulse 86   Temp (!) 97.1 F (36.2 C) (Temporal)   Resp 16   Ht '5\' 8"'$  (1.727 m)   Wt 149 lb 6 oz (67.8 kg)   LMP  (LMP Unknown)   SpO2 99%   BMI 22.71 kg/m   General Appearance:    Alert, cooperative, no distress, appears stated age  Head:    Normocephalic, without obvious abnormality, atraumatic  Eyes:    PERRL, conjunctiva/corneas clear, EOM's intact, fundi    benign, both eyes  Ears:    Normal TM's and external ear canals, both ears  Nose:   Nares normal, septum midline, mucosa normal, no drainage  or sinus tenderness  Throat:   Lips, mucosa, and tongue normal; teeth and gums normal  Neck:   Supple, symmetrical, trachea midline, no adenopathy;    thyroid:  + nodules  Back:     Symmetric, no curvature, ROM normal, no CVA tenderness  Lungs:     Clear to auscultation bilaterally, respirations unlabored  Chest Wall:    No tenderness or deformity   Heart:    Regular rate and rhythm, S1 and S2 normal, no murmur, rub   or gallop  Breast Exam:     Deferred  Abdomen:     Soft, non-tender, bowel sounds active all four quadrants,    no masses, no organomegaly  Genitalia:    deferred  Rectal:    Extremities:   Extremities normal, atraumatic, no cyanosis or edema  Pulses:   2+ and symmetric all extremities  Skin:   Skin color, texture, turgor normal, no rashes or lesions  Lymph nodes:   Cervical, supraclavicular, and axillary nodes normal  Neurologic:   CNII-XII intact, normal strength, sensation and reflexes    throughout    Predictors of intubation difficulty:  Morbid obesity? no  Anatomically abnormal facies? no  Prominent incisors? no  Receding mandible? no  Short, thick neck? no  Neck range of motion: normal  Dentition: No chipped, loose, or missing teeth.  Cardiographics ECG: no change since previous ECG dated 02/2020 (persistent LBBB that was worked up in 2021) Echocardiogram:  done 2021 and WNL  Imaging Chest x-ray:  NA    Lab Review  No visits with results within 2 Month(s) from this visit.  Latest known visit with results is:  Office Visit on 05/31/2021  Component Date Value   Cholesterol 05/31/2021 174    Triglycerides 05/31/2021 61.0    HDL 05/31/2021 97.70    VLDL 05/31/2021 12.2    LDL Cholesterol 05/31/2021 65    Total CHOL/HDL Ratio 05/31/2021 2  NonHDL 05/31/2021 76.71    Sodium 05/31/2021 140    Potassium 05/31/2021 3.9    Chloride 05/31/2021 99    CO2 05/31/2021 28    Glucose, Bld 05/31/2021 84    BUN 05/31/2021 12    Creatinine, Ser 05/31/2021 0.72    GFR 05/31/2021 88.18    Calcium 05/31/2021 9.8    TSH 05/31/2021 1.04    Total Bilirubin 05/31/2021 0.8    Bilirubin, Direct 05/31/2021 0.2    Alkaline Phosphatase 05/31/2021 39    AST 05/31/2021 27    ALT 05/31/2021 28    Total Protein 05/31/2021 7.2    Albumin 05/31/2021 4.6    WBC 05/31/2021 7.1    RBC 05/31/2021 4.51    Hemoglobin 05/31/2021 13.7    HCT 05/31/2021 42.1    MCV 05/31/2021 93.3    MCHC 05/31/2021 32.5    RDW  05/31/2021 13.1    Platelets 05/31/2021 222.0    Neutrophils Relative % 05/31/2021 56.1    Lymphocytes Relative 05/31/2021 31.2    Monocytes Relative 05/31/2021 8.1    Eosinophils Relative 05/31/2021 4.1    Basophils Relative 05/31/2021 0.5    Neutro Abs 05/31/2021 4.0    Lymphs Abs 05/31/2021 2.2    Monocytes Absolute 05/31/2021 0.6    Eosinophils Absolute 05/31/2021 0.3    Basophils Absolute 05/31/2021 0.0       Assessment:      66 y.o. female with planned surgery as above.   Known risk factors for perioperative complications: None   Difficulty with intubation is not anticipated.  Cardiac Risk Estimation: low  Current medications which may produce withdrawal symptoms if withheld perioperatively: Temazepam    Plan:    1. Preoperative workup as follows ECG, hemoglobin, hematocrit, electrolytes, creatinine, glucose, liver function studies, coagulation studies. 2. Change in medication regimen before surgery: discontinue NSAIDs (Celebrex) 14 days before surgery. 3. Prophylaxis for cardiac events with perioperative beta-blockers: not indicated. 4. Invasive hemodynamic monitoring perioperatively: at the discretion of anesthesiologist. 5. Deep vein thrombosis prophylaxis postoperatively:regimen to be chosen by surgical team. 6. Surveillance for postoperative MI with ECG immediately postoperatively and on postoperative days 1 and 2 AND troponin levels 24 hours postoperatively and on day 4 or hospital discharge (whichever comes first): at the discretion of anesthesiologist. 7. Other measures:  None

## 2021-10-25 ENCOUNTER — Telehealth: Payer: Self-pay

## 2021-10-25 NOTE — Telephone Encounter (Signed)
Informed pt of lab results  

## 2021-10-25 NOTE — Telephone Encounter (Signed)
-----   Message from Midge Minium, MD sent at 10/24/2021  8:26 PM EDT ----- Labs look great!  No changes at this time

## 2021-10-27 ENCOUNTER — Encounter: Payer: Self-pay | Admitting: Family Medicine

## 2021-11-17 ENCOUNTER — Other Ambulatory Visit: Payer: Self-pay | Admitting: Family Medicine

## 2021-11-18 DIAGNOSIS — M25561 Pain in right knee: Secondary | ICD-10-CM | POA: Diagnosis not present

## 2021-11-18 DIAGNOSIS — M7061 Trochanteric bursitis, right hip: Secondary | ICD-10-CM | POA: Diagnosis not present

## 2021-11-18 DIAGNOSIS — M1711 Unilateral primary osteoarthritis, right knee: Secondary | ICD-10-CM | POA: Diagnosis not present

## 2021-11-18 DIAGNOSIS — M25551 Pain in right hip: Secondary | ICD-10-CM | POA: Diagnosis not present

## 2021-11-21 ENCOUNTER — Other Ambulatory Visit: Payer: Self-pay | Admitting: Family Medicine

## 2021-12-18 ENCOUNTER — Encounter: Payer: Self-pay | Admitting: Family Medicine

## 2021-12-19 ENCOUNTER — Other Ambulatory Visit: Payer: Self-pay

## 2021-12-19 MED ORDER — ROSUVASTATIN CALCIUM 10 MG PO TABS: 10.0000 mg | ORAL_TABLET | Freq: Every day | ORAL | 3 refills | Status: DC

## 2021-12-19 NOTE — Telephone Encounter (Signed)
Medication refill was sent to the pharmacy

## 2021-12-31 ENCOUNTER — Encounter: Payer: Self-pay | Admitting: Family Medicine

## 2022-01-01 ENCOUNTER — Other Ambulatory Visit: Payer: Self-pay | Admitting: Family

## 2022-01-01 MED ORDER — POTASSIUM CHLORIDE ER 10 MEQ PO CPCR: ORAL_CAPSULE | ORAL | 2 refills | Status: DC

## 2022-01-16 DIAGNOSIS — M170 Bilateral primary osteoarthritis of knee: Secondary | ICD-10-CM | POA: Diagnosis not present

## 2022-01-16 DIAGNOSIS — M1712 Unilateral primary osteoarthritis, left knee: Secondary | ICD-10-CM | POA: Diagnosis not present

## 2022-01-16 DIAGNOSIS — M25562 Pain in left knee: Secondary | ICD-10-CM | POA: Diagnosis not present

## 2022-01-24 ENCOUNTER — Ambulatory Visit (INDEPENDENT_AMBULATORY_CARE_PROVIDER_SITE_OTHER): Payer: Medicare HMO | Admitting: Family Medicine

## 2022-01-24 DIAGNOSIS — Z0000 Encounter for general adult medical examination without abnormal findings: Secondary | ICD-10-CM | POA: Diagnosis not present

## 2022-01-24 MED ORDER — EPINEPHRINE 0.3 MG/0.3ML IJ SOAJ: 0.3000 mg | INTRAMUSCULAR | 1 refills | Status: DC | PRN

## 2022-01-24 NOTE — Progress Notes (Signed)
Subjective:   Beth Frey is a 66 y.o. female who presents for an Initial Medicare Annual Wellness Visit.  Review of Systems    Defer to PCP   I connected with  Beth Frey on 01/24/22 by a audio enabled telemedicine application and verified that I am speaking with the correct person using two identifiers.  Patient Location: Home  Provider Location: Office/Clinic  I discussed the limitations of evaluation and management by telemedicine. The patient expressed understanding and agreed to proceed.      Objective:    There were no vitals filed for this visit. There is no height or weight on file to calculate BMI.     02/19/2020    7:38 PM 11/04/2019    6:00 PM 11/04/2019    4:57 PM 10/21/2017    8:31 PM 01/20/2016   10:22 PM 05/20/2015    9:43 PM 12/21/2014    9:09 PM  Advanced Directives  Does Patient Have a Medical Advance Directive? No Yes Yes No Yes Yes No  Type of Corporate treasurer of Beth Frey;Living will Beth Frey;Living will  Living will    Does patient want to make changes to medical advance directive?  No - Patient declined       Copy of Beth Frey in Chart?  No - copy requested No - copy requested  No - copy requested    Would patient like information on creating a medical advance directive?    No - Patient declined   No - patient declined information    Current Medications (verified) Outpatient Encounter Medications as of 01/24/2022  Medication Sig   celecoxib (CELEBREX) 200 MG capsule Take 1 capsule (200 mg total) by mouth daily.   cyclobenzaprine (FLEXERIL) 10 MG tablet Take 1 tablet (10 mg total) by mouth at bedtime.   EPINEPHrine 0.3 mg/0.3 mL IJ SOAJ injection Inject into the muscle as needed.   Multiple Vitamin (MULTIVITAMIN WITH MINERALS) TABS tablet Take 1 tablet by mouth daily.   potassium chloride (MICRO-K) 10 MEQ CR capsule TAKE 1 CAPSULE BY MOUTH THREE TIMES A DAY   rosuvastatin (CRESTOR) 10 MG tablet  Take 1 tablet (10 mg total) by mouth daily.   sertraline (ZOLOFT) 50 MG tablet Take 1 tablet (50 mg total) by mouth daily.   temazepam (RESTORIL) 30 MG capsule Take 1 capsule (30 mg total) by mouth at bedtime as needed for sleep.   No facility-administered encounter medications on file as of 01/24/2022.    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[place])    Osteoarthritis    Past Surgical History:  Procedure Laterality Date   AUGMENTATION MAMMAPLASTY     Pt has had explants   BREAST SURGERY  August 2011   Implants removed   COLON SURGERY  02/20/2010   Right hemicolectomy-cecal bascule   ELBOW SURGERY     left elbow   JOINT REPLACEMENT  2006   Left total knee replacement   OVARIAN CYST REMOVAL     right   SKIN CANCER EXCISION     multiple-SCC and BCC   TUBAL LIGATION     Uterine ablation     WRIST SURGERY  2009   right wrist   Family  History  Problem Relation Age of Onset   Cancer Mother        multiple myeloma   Parkinson's disease Mother    Cancer Father        stomach cancer   Cancer Brother        pancreatic cancer   Stroke Maternal Grandmother    Cancer Maternal Grandfather        lung cancer   Cancer Paternal Grandmother        ovarian   Heart disease Paternal Grandfather    Cancer Brother        multiple myeloma   Social History   Socioeconomic History   Marital status: Married    Spouse name: Not on file   Number of children: Not on file   Years of education: Not on file   Highest education level: Not on file  Occupational History   Not on file  Tobacco Use   Smoking status: Never   Smokeless tobacco: Never  Vaping Use   Vaping Use: Never used  Substance and Sexual Activity   Alcohol use: No    Alcohol/week: 0.0  standard drinks of alcohol   Drug use: No   Sexual activity: Not Currently    Comment: married  Other Topics Concern   Not on file  Social History Narrative   Not on file   Social Determinants of Health   Financial Resource Strain: Not on file  Food Insecurity: Not on file  Transportation Needs: Not on file  Physical Activity: Not on file  Stress: Not on file  Social Connections: Not on file    Tobacco Counseling Counseling given: Not Answered   Clinical Intake:                 Diabetic?Prediabetes         Activities of Daily Living    07/11/2021    8:47 AM  In your present state of health, do you have any difficulty performing the following activities:  Hearing? 0  Vision? 0  Difficulty concentrating or making decisions? 0  Walking or climbing stairs? 0  Dressing or bathing? 0  Doing errands, shopping? 0    Patient Care Team: Midge Minium, MD as PCP - General (Family Medicine) Ronald Lobo, MD (Gastroenterology) Dohmeier, Asencion Partridge, MD (Neurology) Iran Planas, MD (Orthopedic Surgery) Williford, Layne Benton, MD as Referring Physician (Dermatology) Lynden Ang, MD (General Practice)  Indicate any recent Medical Services you may have received from other than [facility] providers in the past year (date may be approximate).     Assessment:   This is a routine wellness examination for Beth Frey.  Hearing/Vision screen No results found.  Dietary issues and exercise activities discussed:     Goals Addressed   None    Depression Screen    10/24/2021    8:26 AM 07/11/2021    8:49 AM 05/31/2021   10:17 AM 10/19/2020    9:53 AM 07/02/2020    9:59 AM 06/30/2019   11:30 AM 07/19/2016   10:59 AM  PHQ 2/9 Scores  PHQ - 2 Score 0 0 0 0 0 0 0  PHQ- 9 Score 0 1 2 0  0     Fall Risk    10/24/2021    8:26 AM 07/11/2021    8:49 AM 05/31/2021   10:16 AM 10/19/2020    9:53 AM 07/19/2016   10:59 AM  Fall Risk   Falls in the past year? 0 0 0 0 No  Number  falls in past yr: 0  0 0   Injury with Fall? 0  0 0   Risk for fall due to : No Fall Risks No Fall Risks No Fall Risks No Fall Risks   Follow up Falls evaluation completed Falls evaluation completed Falls evaluation completed      [place]:  Any stairs in or around the home? Yes  If so, are there any without handrails? No Home free of loose throw rugs in walkways, pet beds, electrical cords, etc? Yes Adequate lighting in your home to reduce risk of falls? Yes   ASSISTIVE DEVICES UTILIZED TO PREVENT FALLS:  Life alert? No  Use of a cane, walker or w/c? No  Grab bars in the bathroom? Yes  Shower chair or bench in shower? Yes  Elevated toilet seat or a handicapped toilet? No    Cognitive Function:        Immunizations Immunization History  Administered Date(s) Administered   PFIZER(Purple Top)SARS-COV-2 Vaccination 01/08/2020, 02/08/2020   Tdap 10/20/2013    TDAP status: Up to date  Flu Vaccine status: Due, Education has been provided regarding the importance of this vaccine. Advised may receive this vaccine at local pharmacy or Health Dept. Aware to provide a copy of the vaccination record if obtained from local pharmacy or Health Dept. Verbalized acceptance and understanding.  Pneumococcal vaccine status: Due, Education has been provided regarding the importance of this vaccine. Advised may receive this vaccine at local pharmacy or Health Dept. Aware to provide a copy of the vaccination record if obtained from local pharmacy or Health Dept. Verbalized acceptance and understanding.  Covid-19 vaccine status: Completed vaccines  Qualifies for Shingles Vaccine? No   Zostavax completed No   Shingrix Completed?: No.    Education has been provided regarding the importance of this vaccine. Patient has been advised to call insurance company to determine out of pocket expense if they have not yet received this vaccine. Advised may also receive  vaccine at local pharmacy or Health Dept. Verbalized acceptance and understanding.  Screening Tests Health Maintenance  Topic Date Due   INFLUENZA VACCINE  Never done   Zoster Vaccines- Shingrix (1 of 2) 01/24/2022 (Originally [birth year]/1975)   Pneumonia Vaccine 13+ Years old (1 - PCV) 10/25/2022 (Originally 09/15/2021)   PAP SMEAR-Modifier  12/08/2022   MAMMOGRAM  06/03/2023   TETANUS/TDAP  10/21/2023   COLONOSCOPY (Pts 45-90yr Insurance coverage will need to be confirmed)  06/30/2029   DEXA SCAN  Completed   Hepatitis C Screening  Completed   HIV Screening  Completed   HPV VACCINES  Aged Out   COVID-19 Vaccine  Discontinued    Health Maintenance  Health Maintenance Due  Topic Date Due   INFLUENZA VACCINE  Never done    Colorectal cancer screening: Type of screening: Colonoscopy. Completed 07/01/2019. Repeat every 10 years  Mammogram status: Completed 06/02/2021. Repeat every year  Bone Density status: Completed 09/22/2014. Results reflect: Bone density results: OSTEOPENIA. Repeat every 2 years.  Lung Cancer Screening: (Low Dose CT Chest recommended if Age 65-80years, 30 pack-year currently smoking OR have quit w/in 15years.) does not qualify.   Lung Cancer Screening Referral: declined  Additional Screening:  Hepatitis C Screening: does qualify; Completed 09/14/2015  Vision Screening: Recommended annual ophthalmology exams for early detection of glaucoma and other disorders of the eye. Is the patient up to date with their annual eye exam?  Yes  Who is the provider or what  is the name of the office in which the patient attends annual eye exams? [facility]  If pt is not established with a provider, would they like to be referred to a provider to establish care? No .   Dental Screening: Recommended annual dental exams for proper oral hygiene  Community Resource Referral / Chronic Care Management: CRR required this visit?  No   CCM required this visit?  No       Plan:     I have personally reviewed and noted the following in the patient's chart:   Medical and social history Use of alcohol, tobacco or illicit drugs  Current medications and supplements including opioid prescriptions. Patient is not currently taking opioid prescriptions. Functional ability and status Nutritional status Physical activity Advanced directives List of other physicians Hospitalizations, surgeries, and ER visits in previous 12 months Vitals Screenings to include cognitive, depression, and falls Referrals and appointments  In addition, I have reviewed and discussed with patient certain preventive protocols, quality metrics, and best practice recommendations. A written personalized care plan for preventive services as well as general preventive health recommendations were provided to patient.     [place], [place]   01/24/2022   Nurse Notes:

## 2022-01-24 NOTE — Patient Instructions (Signed)
  Beth Frey , Thank you for taking time to come for your Medicare Wellness Visit. I appreciate your ongoing commitment to your health goals. Please review the following plan we discussed and let me know if I can assist you in the future.   These are the goals we discussed:  Goals   None     This is a list of the screening recommended for you and due dates:  Health Maintenance  Topic Date Due   Flu Shot  Never done   Zoster (Shingles) Vaccine (1 of 2) 01/24/2022*   Pneumonia Vaccine (1 - PCV) 10/25/2022*   Pap Smear  12/08/2022   Mammogram  06/03/2023   Tetanus Vaccine  10/21/2023   Colon Cancer Screening  06/30/2029   DEXA scan (bone density measurement)  Completed   Hepatitis C Screening: USPSTF Recommendation to screen - Ages 22-79 yo.  Completed   HIV Screening  Completed   HPV Vaccine  Aged Out   COVID-19 Vaccine  Discontinued  *Topic was postponed. The date shown is not the original due date.

## 2022-01-25 ENCOUNTER — Other Ambulatory Visit: Payer: Self-pay

## 2022-01-25 ENCOUNTER — Encounter: Payer: Self-pay | Admitting: Family Medicine

## 2022-01-25 MED ORDER — EPINEPHRINE 0.3 MG/0.3ML IJ SOAJ: 0.3000 mg | INTRAMUSCULAR | 1 refills | Status: AC | PRN

## 2022-01-26 DIAGNOSIS — H5213 Myopia, bilateral: Secondary | ICD-10-CM | POA: Diagnosis not present

## 2022-02-14 ENCOUNTER — Encounter (INDEPENDENT_AMBULATORY_CARE_PROVIDER_SITE_OTHER): Payer: Medicare HMO | Admitting: Family Medicine

## 2022-02-14 DIAGNOSIS — G479 Sleep disorder, unspecified: Secondary | ICD-10-CM | POA: Diagnosis not present

## 2022-02-15 MED ORDER — ZOLPIDEM TARTRATE 5 MG PO TABS: 5.0000 mg | ORAL_TABLET | Freq: Every evening | ORAL | 0 refills | Status: DC | PRN

## 2022-02-15 NOTE — Telephone Encounter (Signed)
[facility] VISIT   Patient agreed to [facility] visit and is aware that copayment and coinsurance may apply. Patient was treated using telemedicine according to accepted telemedicine protocols.  Subjective:   Patient complains of insomnia  Patient Active Problem List   Diagnosis Date Noted   Physical exam 07/11/2021   Abnormal feces 10/19/2020   Family history of malignant neoplasm of gastrointestinal tract 10/19/2020   Gastroesophageal reflux disease 10/19/2020   History of adenomatous polyp of colon 10/19/2020   Osteochondromatosis 05/24/2020   Prediabetes 11/06/2019   Syncope 11/04/2019   Temporomandibular joint disorder (TMJ) 10/10/2019   Fibrocystic change of breast, left 04/04/2019   Ankle impingement syndrome, left 03/26/2019   Right foot pain 03/26/2019   Basal cell carcinoma (BCC) of left lower extremity 03/05/2019   Mohs defect 03/05/2019   History of prosthetic unicompartmental arthroplasty of left knee 07/23/2017   Adjustment insomnia 10/31/2016   Carcinoma in situ of skin of eyelid including canthus 06/26/2012   Hyperlipidemia 11/23/2011   Vitamin D deficiency 11/23/2011   History of abnormal cervical Pap smear 11/23/2011   Anxiety 11/23/2011   Thyroid nodule 11/23/2011   Skin cancer 11/23/2011   Chronic hypokalemia 11/23/2011   Incisional hernia 06/14/2011   Social History   Tobacco Use   Smoking status: Never   Smokeless tobacco: Never  Substance Use Topics   Alcohol use: No    Alcohol/week: 0.0 standard drinks of alcohol    Current Outpatient Medications:    zolpidem (AMBIEN) 5 MG tablet, Take 1 tablet (5 mg total) by mouth at bedtime as needed for sleep., Disp: 30 tablet, Rfl: 0   celecoxib (CELEBREX) 200 MG capsule, Take 1 capsule (200 mg total) by mouth daily., Disp: 90 capsule, Rfl: 1   cyclobenzaprine (FLEXERIL) 10 MG tablet, Take 1 tablet (10 mg total) by mouth at bedtime., Disp: 60 tablet, Rfl: 1   EPINEPHrine 0.3 mg/0.3 mL IJ SOAJ injection, Inject  0.3 mg into the muscle as needed., Disp: 1 each, Rfl: 1   Multiple Vitamin (MULTIVITAMIN WITH MINERALS) TABS tablet, Take 1 tablet by mouth daily., Disp: , Rfl:    potassium chloride (MICRO-K) 10 MEQ CR capsule, TAKE 1 CAPSULE BY MOUTH THREE TIMES A DAY, Disp: 270 capsule, Rfl: 2   rosuvastatin (CRESTOR) 10 MG tablet, Take 1 tablet (10 mg total) by mouth daily., Disp: 90 tablet, Rfl: 3   sertraline (ZOLOFT) 50 MG tablet, Take 1 tablet (50 mg total) by mouth daily., Disp: 90 tablet, Rfl: 1   temazepam (RESTORIL) 30 MG capsule, Take 1 capsule (30 mg total) by mouth at bedtime as needed for sleep., Disp: 30 capsule, Rfl: 3  Allergies  Allergen Reactions   Fish Allergy Anaphylaxis   Sulfa Antibiotics Itching and Swelling    Throat, body swelling   Doxycycline     Sun    Trazodone And Nefazodone Other (See Comments)    Serotonin syndrome d/t other medications   Vancomycin Rash    Assessment and Plan:   Diagnosis: insomnia. Please see myChart communication and orders below.   No orders of the defined types were placed in this encounter.  Meds ordered this encounter  Medications   zolpidem (AMBIEN) 5 MG tablet    Sig: Take 1 tablet (5 mg total) by mouth at bedtime as needed for sleep.    Dispense:  30 tablet    Refill:  0    Annye Asa, MD 02/15/2022  A total of 7 minutes were spent by me to personally  review the patient-generated inquiry, review patient records and data pertinent to assessment of the patient's problem, develop a management plan including generation of prescriptions and/or orders, and on subsequent communication with the patient through secure the MyChart portal service.   There is no separately reported E/M service related to this service in the past 7 days nor does the patient have an upcoming soonest available appointment for this issue. This work was completed in less than 7 days.   The patient consented to this service today (see patient agreement prior to  ongoing communication). Patient counseled regarding the need for in-person exam for certain conditions and was advised to call the office if any changing or worsening symptoms occur.   The codes to be used for the E/M service are: '[x]'$   99421 for 5-10 minutes of time spent on the inquiry. '[]'$   99422 for 11-20 minutes. '[]'$   [phone] for 21+ minutes.

## 2022-02-16 ENCOUNTER — Other Ambulatory Visit: Payer: Self-pay | Admitting: Family Medicine

## 2022-02-20 MED ORDER — LORAZEPAM 2 MG PO TABS: 2.0000 mg | ORAL_TABLET | Freq: Every evening | ORAL | 0 refills | Status: DC | PRN

## 2022-02-20 NOTE — Addendum Note (Signed)
Addended by: Midge Minium on: 02/20/2022 11:48 AM   Modules accepted: Orders

## 2022-03-02 ENCOUNTER — Other Ambulatory Visit: Payer: Self-pay

## 2022-03-02 DIAGNOSIS — F419 Anxiety disorder, unspecified: Secondary | ICD-10-CM

## 2022-03-02 MED ORDER — SERTRALINE HCL 50 MG PO TABS: 50.0000 mg | ORAL_TABLET | Freq: Every day | ORAL | 1 refills | Status: DC

## 2022-03-24 DIAGNOSIS — M25551 Pain in right hip: Secondary | ICD-10-CM | POA: Diagnosis not present

## 2022-03-24 DIAGNOSIS — M7061 Trochanteric bursitis, right hip: Secondary | ICD-10-CM | POA: Diagnosis not present

## 2022-04-02 ENCOUNTER — Encounter: Payer: Self-pay | Admitting: Family Medicine

## 2022-04-07 DIAGNOSIS — M1711 Unilateral primary osteoarthritis, right knee: Secondary | ICD-10-CM | POA: Diagnosis not present

## 2022-04-07 DIAGNOSIS — M170 Bilateral primary osteoarthritis of knee: Secondary | ICD-10-CM | POA: Diagnosis not present

## 2022-04-07 DIAGNOSIS — M1712 Unilateral primary osteoarthritis, left knee: Secondary | ICD-10-CM | POA: Diagnosis not present

## 2022-04-10 ENCOUNTER — Other Ambulatory Visit: Payer: Self-pay | Admitting: Family Medicine

## 2022-04-10 NOTE — Telephone Encounter (Signed)
Patient is requesting a refill of the following medications: Requested Prescriptions   Pending Prescriptions Disp Refills   LORazepam (ATIVAN) 2 MG tablet [Pharmacy Med Name: LORAZEPAM 2 MG TABLET] 30 tablet 0    Sig: Take 1 tablet (2 mg total) by mouth at bedtime as needed for anxiety or sleep. STOP TEMAZEPAM    Date of patient request: 04/10/22 Last office visit: 01/24/22 Date of last refill: 02/20/22 Last refill amount: 30

## 2022-04-14 DIAGNOSIS — M170 Bilateral primary osteoarthritis of knee: Secondary | ICD-10-CM | POA: Diagnosis not present

## 2022-04-19 DIAGNOSIS — M170 Bilateral primary osteoarthritis of knee: Secondary | ICD-10-CM | POA: Diagnosis not present

## 2022-04-24 DIAGNOSIS — M1990 Unspecified osteoarthritis, unspecified site: Secondary | ICD-10-CM | POA: Diagnosis not present

## 2022-04-24 DIAGNOSIS — Z87892 Personal history of anaphylaxis: Secondary | ICD-10-CM | POA: Diagnosis not present

## 2022-04-24 DIAGNOSIS — Z008 Encounter for other general examination: Secondary | ICD-10-CM | POA: Diagnosis not present

## 2022-04-24 DIAGNOSIS — Z791 Long term (current) use of non-steroidal anti-inflammatories (NSAID): Secondary | ICD-10-CM | POA: Diagnosis not present

## 2022-04-24 DIAGNOSIS — K219 Gastro-esophageal reflux disease without esophagitis: Secondary | ICD-10-CM | POA: Diagnosis not present

## 2022-04-24 DIAGNOSIS — L309 Dermatitis, unspecified: Secondary | ICD-10-CM | POA: Diagnosis not present

## 2022-04-24 DIAGNOSIS — R69 Illness, unspecified: Secondary | ICD-10-CM | POA: Diagnosis not present

## 2022-04-24 DIAGNOSIS — E785 Hyperlipidemia, unspecified: Secondary | ICD-10-CM | POA: Diagnosis not present

## 2022-04-24 DIAGNOSIS — R030 Elevated blood-pressure reading, without diagnosis of hypertension: Secondary | ICD-10-CM | POA: Diagnosis not present

## 2022-04-24 DIAGNOSIS — Z85828 Personal history of other malignant neoplasm of skin: Secondary | ICD-10-CM | POA: Diagnosis not present

## 2022-04-24 DIAGNOSIS — E876 Hypokalemia: Secondary | ICD-10-CM | POA: Diagnosis not present

## 2022-04-24 DIAGNOSIS — Z882 Allergy status to sulfonamides status: Secondary | ICD-10-CM | POA: Diagnosis not present

## 2022-05-03 ENCOUNTER — Inpatient Hospital Stay (HOSPITAL_BASED_OUTPATIENT_CLINIC_OR_DEPARTMENT_OTHER): Admission: RE | Admit: 2022-05-03 | Payer: Medicare HMO | Source: Ambulatory Visit | Admitting: Radiology

## 2022-05-03 DIAGNOSIS — Z1231 Encounter for screening mammogram for malignant neoplasm of breast: Secondary | ICD-10-CM

## 2022-05-29 ENCOUNTER — Other Ambulatory Visit: Payer: Self-pay | Admitting: Family Medicine

## 2022-06-01 ENCOUNTER — Ambulatory Visit (HOSPITAL_BASED_OUTPATIENT_CLINIC_OR_DEPARTMENT_OTHER)
Admission: RE | Admit: 2022-06-01 | Discharge: 2022-06-01 | Disposition: A | Discharge status: Home / Self Care | Payer: Medicare HMO | Source: Ambulatory Visit | Attending: Emergency Medicine | Admitting: Emergency Medicine

## 2022-06-01 ENCOUNTER — Other Ambulatory Visit (HOSPITAL_BASED_OUTPATIENT_CLINIC_OR_DEPARTMENT_OTHER): Payer: Self-pay | Admitting: Emergency Medicine

## 2022-06-01 DIAGNOSIS — M79671 Pain in right foot: Secondary | ICD-10-CM | POA: Insufficient documentation

## 2022-06-02 ENCOUNTER — Other Ambulatory Visit: Payer: Self-pay | Admitting: Family Medicine

## 2022-06-02 MED ORDER — LORAZEPAM 2 MG PO TABS: 2.0000 mg | ORAL_TABLET | Freq: Every evening | ORAL | 0 refills | Status: DC | PRN

## 2022-06-02 NOTE — Telephone Encounter (Signed)
Left pt a vm stating rx has been sent in

## 2022-06-02 NOTE — Telephone Encounter (Signed)
Ativan 2 mg LOV: 01/24/22 Last Refill:04/11/22 Upcoming appt: none

## 2022-06-03 ENCOUNTER — Other Ambulatory Visit (HOSPITAL_BASED_OUTPATIENT_CLINIC_OR_DEPARTMENT_OTHER): Payer: Self-pay | Admitting: Family Medicine

## 2022-06-03 ENCOUNTER — Encounter (HOSPITAL_BASED_OUTPATIENT_CLINIC_OR_DEPARTMENT_OTHER): Payer: Medicare HMO | Admitting: Radiology

## 2022-06-03 DIAGNOSIS — Z1231 Encounter for screening mammogram for malignant neoplasm of breast: Secondary | ICD-10-CM

## 2022-06-03 DIAGNOSIS — M2021 Hallux rigidus, right foot: Secondary | ICD-10-CM | POA: Diagnosis not present

## 2022-06-09 ENCOUNTER — Ambulatory Visit (HOSPITAL_BASED_OUTPATIENT_CLINIC_OR_DEPARTMENT_OTHER)
Admission: RE | Admit: 2022-06-09 | Discharge: 2022-06-09 | Disposition: A | Discharge status: Home / Self Care | Payer: Medicare HMO | Source: Ambulatory Visit | Attending: Family Medicine | Admitting: Family Medicine

## 2022-06-09 DIAGNOSIS — Z1231 Encounter for screening mammogram for malignant neoplasm of breast: Secondary | ICD-10-CM | POA: Diagnosis not present

## 2022-06-14 DIAGNOSIS — S92354A Nondisplaced fracture of fifth metatarsal bone, right foot, initial encounter for closed fracture: Secondary | ICD-10-CM | POA: Diagnosis not present

## 2022-06-14 DIAGNOSIS — M79671 Pain in right foot: Secondary | ICD-10-CM | POA: Diagnosis not present

## 2022-06-15 ENCOUNTER — Encounter: Payer: Self-pay | Admitting: General Practice

## 2022-07-01 ENCOUNTER — Encounter: Payer: Self-pay | Admitting: Family Medicine

## 2022-07-03 ENCOUNTER — Other Ambulatory Visit: Payer: Self-pay

## 2022-07-03 DIAGNOSIS — F4321 Adjustment disorder with depressed mood: Secondary | ICD-10-CM

## 2022-07-06 ENCOUNTER — Other Ambulatory Visit: Payer: Self-pay | Admitting: Family Medicine

## 2022-07-06 ENCOUNTER — Encounter: Payer: Self-pay | Admitting: Family Medicine

## 2022-07-07 ENCOUNTER — Telehealth: Payer: Self-pay

## 2022-07-07 NOTE — Telephone Encounter (Signed)
Ativan 2 mg LOV: 01/24/22 Last Refill:06/02/22 Upcoming appt: 07/13/22

## 2022-07-07 NOTE — Telephone Encounter (Signed)
Notified the pt that the Rx has been sent in
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
## 2022-07-07 NOTE — Telephone Encounter (Signed)
We had put a referral in for a counselor  for pt and you sent it on 07/03/22 @ 10 25 am and pt states she called the place we sent it to the pt called and told her they have not received the referral ? Any suggestions on what to do next ?

## 2022-07-10 NOTE — Telephone Encounter (Signed)
Please call and check on patient today as she was having acute mental health issues on Friday
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
## 2022-07-10 NOTE — Telephone Encounter (Signed)
Pt called & lvm to return call

## 2022-07-13 ENCOUNTER — Ambulatory Visit (INDEPENDENT_AMBULATORY_CARE_PROVIDER_SITE_OTHER): Payer: Medicare HMO | Admitting: Family Medicine

## 2022-07-13 ENCOUNTER — Encounter: Payer: Self-pay | Admitting: Family Medicine

## 2022-07-13 VITALS — BP 124/70 | HR 93 | Temp 97.9°F | Resp 17 | Ht 68.0 in | Wt 129.4 lb

## 2022-07-13 DIAGNOSIS — F419 Anxiety disorder, unspecified: Secondary | ICD-10-CM | POA: Diagnosis not present

## 2022-07-13 DIAGNOSIS — E559 Vitamin D deficiency, unspecified: Secondary | ICD-10-CM

## 2022-07-13 DIAGNOSIS — R69 Illness, unspecified: Secondary | ICD-10-CM | POA: Diagnosis not present

## 2022-07-13 DIAGNOSIS — Z0001 Encounter for general adult medical examination with abnormal findings: Secondary | ICD-10-CM | POA: Diagnosis not present

## 2022-07-13 DIAGNOSIS — Z0000 Encounter for general adult medical examination without abnormal findings: Secondary | ICD-10-CM

## 2022-07-13 DIAGNOSIS — E782 Mixed hyperlipidemia: Secondary | ICD-10-CM | POA: Diagnosis not present

## 2022-07-13 LAB — CBC WITH DIFFERENTIAL/PLATELET
Basophils Absolute: 0 10*3/uL (ref 0.0–0.1)
Basophils Relative: 0.3 % (ref 0.0–3.0)
Eosinophils Absolute: 0.1 10*3/uL (ref 0.0–0.7)
Eosinophils Relative: 0.8 % (ref 0.0–5.0)
HCT: 43.2 % (ref 36.0–46.0)
Hemoglobin: 14.5 g/dL (ref 12.0–15.0)
Lymphocytes Relative: 34.5 % (ref 12.0–46.0)
Lymphs Abs: 2.7 10*3/uL (ref 0.7–4.0)
MCHC: 33.7 g/dL (ref 30.0–36.0)
MCV: 94.6 fl (ref 78.0–100.0)
Monocytes Absolute: 0.6 10*3/uL (ref 0.1–1.0)
Monocytes Relative: 7.7 % (ref 3.0–12.0)
Neutro Abs: 4.5 10*3/uL (ref 1.4–7.7)
Neutrophils Relative %: 56.7 % (ref 43.0–77.0)
Platelets: 264 10*3/uL (ref 150.0–400.0)
RBC: 4.56 Mil/uL (ref 3.87–5.11)
RDW: 12.7 % (ref 11.5–15.5)
WBC: 8 10*3/uL (ref 4.0–10.5)

## 2022-07-13 LAB — LIPID PANEL
Cholesterol: 191 mg/dL (ref 0–200)
HDL: 104.7 mg/dL (ref >39.00)
LDL Cholesterol: 71 mg/dL (ref 0–99)
NonHDL: 86.57
Total CHOL/HDL Ratio: 2
Triglycerides: 79 mg/dL (ref 0.0–149.0)
VLDL: 15.8 mg/dL (ref 0.0–40.0)

## 2022-07-13 LAB — HEPATIC FUNCTION PANEL
ALT: 46 U/L — ABNORMAL HIGH (ref 0–35)
AST: 51 U/L — ABNORMAL HIGH (ref 0–37)
Albumin: 4.5 g/dL (ref 3.5–5.2)
Alkaline Phosphatase: 34 U/L — ABNORMAL LOW (ref 39–117)
Bilirubin, Direct: 0.2 mg/dL (ref 0.0–0.3)
Total Bilirubin: 0.9 mg/dL (ref 0.2–1.2)
Total Protein: 7.7 g/dL (ref 6.0–8.3)

## 2022-07-13 LAB — TSH: TSH: 1.18 u[IU]/mL (ref 0.35–5.50)

## 2022-07-13 LAB — BASIC METABOLIC PANEL
BUN: 18 mg/dL (ref 6–23)
CO2: 25 mEq/L (ref 19–32)
Calcium: 10.4 mg/dL (ref 8.4–10.5)
Chloride: 98 mEq/L (ref 96–112)
Creatinine, Ser: 0.74 mg/dL (ref 0.40–1.20)
GFR: 84.66 mL/min (ref >60.00)
Glucose, Bld: 86 mg/dL (ref 70–99)
Potassium: 4.3 mEq/L (ref 3.5–5.1)
Sodium: 135 mEq/L (ref 135–145)

## 2022-07-13 LAB — VITAMIN D 25 HYDROXY (VIT D DEFICIENCY, FRACTURES): VITD: 63.2 ng/mL (ref 30.00–100.00)

## 2022-07-13 MED ORDER — SERTRALINE HCL 100 MG PO TABS: 100.0000 mg | ORAL_TABLET | Freq: Every day | ORAL | 1 refills | Status: DC
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
## 2022-07-13 NOTE — Assessment & Plan Note (Signed)
Tolerating statin w/o difficulty.  Check labs.  Adjust meds prn

## 2022-07-13 NOTE — Assessment & Plan Note (Signed)
Pt's PE WNL.  UTD on pap, mammo, colonoscopy, Tdap.  Declines flu.  Check labs.  Anticipatory guidance provided.

## 2022-07-13 NOTE — Patient Instructions (Signed)
Follow up in 6 weeks to recheck mood We'll notify you of your lab results and make any changes if needed INCREASE your Sertraline to '100mg'$  daily- 2 of what you have at home and 1 of the new prescription Make sure you are eating regularly Call with any questions or concerns Hang in there!!  You can do this!
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
## 2022-07-13 NOTE — Assessment & Plan Note (Signed)
Check labs and replete prn. 

## 2022-07-14 ENCOUNTER — Other Ambulatory Visit: Payer: Self-pay

## 2022-07-14 ENCOUNTER — Telehealth: Payer: Self-pay

## 2022-07-14 DIAGNOSIS — R748 Abnormal levels of other serum enzymes: Secondary | ICD-10-CM

## 2022-07-14 NOTE — Telephone Encounter (Signed)
-----   Message from Midge Minium, MD sent at 07/14/2022  7:34 AM EST ----- Labs look good w/ exception of mildly elevated AST/ALT (liver functions).  Please hold tylenol and any alcohol x2 weeks and we'll repeat your liver functions at a lab only visit (dx elevated liver enzymes)

## 2022-07-14 NOTE — Telephone Encounter (Signed)
Informed pt of lab results . Repeat liver function test is in

## 2022-07-17 ENCOUNTER — Encounter: Payer: Self-pay | Admitting: Obstetrics and Gynecology

## 2022-07-17 ENCOUNTER — Ambulatory Visit (INDEPENDENT_AMBULATORY_CARE_PROVIDER_SITE_OTHER): Payer: Medicare HMO | Admitting: Obstetrics and Gynecology

## 2022-07-17 ENCOUNTER — Other Ambulatory Visit (HOSPITAL_COMMUNITY)
Admission: RE | Admit: 2022-07-17 | Discharge: 2022-07-17 | Disposition: A | Discharge status: Home / Self Care | Payer: Medicare HMO | Source: Ambulatory Visit | Attending: Obstetrics and Gynecology | Admitting: Obstetrics and Gynecology

## 2022-07-17 VITALS — BP 143/77 | HR 75 | Ht 68.0 in | Wt 132.0 lb

## 2022-07-17 DIAGNOSIS — Z124 Encounter for screening for malignant neoplasm of cervix: Secondary | ICD-10-CM

## 2022-07-17 DIAGNOSIS — Z01419 Encounter for gynecological examination (general) (routine) without abnormal findings: Secondary | ICD-10-CM | POA: Diagnosis not present

## 2022-07-17 DIAGNOSIS — R69 Illness, unspecified: Secondary | ICD-10-CM | POA: Diagnosis not present

## 2022-07-17 DIAGNOSIS — Z1151 Encounter for screening for human papillomavirus (HPV): Secondary | ICD-10-CM | POA: Insufficient documentation

## 2022-07-17 NOTE — Progress Notes (Signed)
Last pap August 2019

## 2022-07-17 NOTE — Progress Notes (Unsigned)
ANNUAL EXAM Patient name: Beth Frey MRN [id number]  Date of birth: [birth year] Chief Complaint:   Gynecologic Exam  History of Present Illness:   Beth Frey is a 67 y.o. G0P0000 being seen today for a routine annual exam.  Current complaints: none  Menstrual concerns? No   Breast or nipple changes? No  Contraception use? No  Sexually active? No   No LMP recorded (lmp unknown). Patient is postmenopausal.   The pregnancy intention screening data noted above was reviewed. Potential methods of contraception were discussed. The patient elected to proceed with No data recorded.   Last pap     Component Value Date/Time   DIAGPAP  07/17/2022 1052    - Negative for intraepithelial lesion or malignancy (NILM)   DIAGPAP  12/07/2017 0000    NEGATIVE FOR INTRAEPITHELIAL LESIONS OR MALIGNANCY.   [place] Negative 07/17/2022 1052   ADEQPAP  07/17/2022 1052    Satisfactory for evaluation; transformation zone component PRESENT.   ADEQPAP  12/07/2017 0000    Satisfactory for evaluation  endocervical/transformation zone component ABSENT.    High Risk HPV: Positive  Adequacy:  Satisfactory for evaluation, transformation zone component PRESENT  Diagnosis:  Atypical squamous cells of undetermined significance (ASC-US)  H/O abnormal pap: yes 15+ years had a cryo  Last mammogram: 06/2022 BIRADS 1, no prior abnormal  Last colonoscopy: 06/2019.      07/13/2022    8:49 AM 01/24/2022    9:17 AM 10/24/2021    8:26 AM 07/11/2021    8:49 AM 05/31/2021   10:17 AM  Depression screen PHQ 2/9  Decreased Interest 0 0 0 0 0  Down, Depressed, Hopeless 0 0 0 0 0  PHQ - 2 Score 0 0 0 0 0  Altered sleeping 2  0 1 2  Tired, decreased energy 3  0 0 0  Change in appetite 1  0 0 0  Feeling bad or failure about yourself  0  0 0 0  Trouble concentrating 0  0 0 0  Moving slowly or fidgety/restless 0  0 0 0  Suicidal thoughts 0  0 0 0  PHQ-9 Score 6  0 1 2  Difficult doing work/chores Not difficult at all  Not  difficult at all Not difficult at all         07/13/2022    8:50 AM 09/18/2018    8:29 AM  GAD 7 : Generalized Anxiety Score  Nervous, Anxious, on Edge 2 0  Control/stop worrying 0 0  Worry too much - different things 1 0  Trouble relaxing 0 0  Restless  0  Easily annoyed or irritable 0 0  Afraid - awful might happen 3 0  Total GAD 7 Score  0  Anxiety Difficulty Not difficult at all Not difficult at all     Review of Systems:   Pertinent items are noted in HPI Denies any headaches, blurred vision, fatigue, shortness of breath, chest pain, abdominal pain, abnormal vaginal discharge/itching/odor/irritation, problems with periods, bowel movements, urination, or intercourse unless otherwise stated above. Pertinent History Reviewed:  Reviewed past medical,surgical, social and family history.  Reviewed problem list, medications and allergies. Physical Assessment:   Vitals:   07/17/22 1024  BP: (!) 143/77  Pulse: 75  Weight: 132 lb (59.9 kg)  Height: '5\' 8"'$  (1.727 m)  Body mass index is 20.07 kg/m.        Physical Examination:   General appearance - well appearing, and in no distress  Mental status -  alert, oriented to person, place, and time  Psych:  She has a normal mood and affect  Skin - warm and dry, normal color, no suspicious lesions noted  Chest - effort normal, all lung fields clear to auscultation bilaterally  Heart - normal rate and regular rhythm  Breasts - breasts appear normal, no suspicious masses, no skin or nipple changes or axillary nodes  Abdomen - soft, nontender, nondistended, no masses or organomegaly  Pelvic -  VULVA: normal appearing vulva with no masses, tenderness or lesions   VAGINA: normal appearing vagina with normal color and discharge, no lesions  CERVIX: normal appearing cervix without discharge or lesions, no CMT  Thin prep pap is done with HR HPV cotesting  UTERUS: uterus is felt to be normal size, shape, consistency and nontender   ADNEXA:  No adnexal masses or tenderness noted.  Extremities:  No swelling or varicosities noted  Chaperone present for exam  No results found for this or any previous visit (from the past 24 hour(s)).    Assessment & Plan:  1. Well woman exam with routine gynecological exam Mammo up to date  All questions answered - Cytology - PAP( [place])  2. Screening for cervical cancer Routine pap collected - Cytology - PAP( [place])    No orders of the defined types were placed in this encounter.   Meds: No orders of the defined types were placed in this encounter.   Follow-up: No follow-ups on file.  Darliss Cheney, MD 07/17/2022 10:36 AM

## 2022-07-18 LAB — CYTOLOGY - PAP
Comment: NEGATIVE
Diagnosis: NEGATIVE
High risk HPV: NEGATIVE

## 2022-07-27 ENCOUNTER — Other Ambulatory Visit (INDEPENDENT_AMBULATORY_CARE_PROVIDER_SITE_OTHER): Payer: Medicare HMO

## 2022-07-27 ENCOUNTER — Encounter: Payer: Self-pay | Admitting: Family Medicine

## 2022-07-27 ENCOUNTER — Telehealth: Payer: Self-pay

## 2022-07-27 DIAGNOSIS — R748 Abnormal levels of other serum enzymes: Secondary | ICD-10-CM

## 2022-07-27 LAB — HEPATIC FUNCTION PANEL
ALT: 29 U/L (ref 0–35)
AST: 34 U/L (ref 0–37)
Albumin: 4.8 g/dL (ref 3.5–5.2)
Alkaline Phosphatase: 34 U/L — ABNORMAL LOW (ref 39–117)
Bilirubin, Direct: 0.1 mg/dL (ref 0.0–0.3)
Total Bilirubin: 0.7 mg/dL (ref 0.2–1.2)
Total Protein: 7.8 g/dL (ref 6.0–8.3)

## 2022-07-27 NOTE — Telephone Encounter (Signed)
-----   Message from Midge Minium, MD sent at 07/27/2022  1:15 PM EDT ----- Liver functions are now normal.  This is great news!

## 2022-07-27 NOTE — Telephone Encounter (Signed)
Left lab results on pt VM 

## 2022-07-28 DIAGNOSIS — R69 Illness, unspecified: Secondary | ICD-10-CM | POA: Diagnosis not present

## 2022-07-28 DIAGNOSIS — F4321 Adjustment disorder with depressed mood: Secondary | ICD-10-CM | POA: Diagnosis not present

## 2022-07-28 DIAGNOSIS — F411 Generalized anxiety disorder: Secondary | ICD-10-CM | POA: Diagnosis not present

## 2022-08-13 ENCOUNTER — Other Ambulatory Visit: Payer: Self-pay | Admitting: Family Medicine

## 2022-08-14 NOTE — Telephone Encounter (Signed)
Lorazpam 2 mg LOV: 07/13/22 Last Refill:07/07/22 Next 08/24/22

## 2022-08-15 NOTE — Telephone Encounter (Signed)
Left Vm stating Rx has been sent in 

## 2022-08-18 DIAGNOSIS — R69 Illness, unspecified: Secondary | ICD-10-CM | POA: Diagnosis not present

## 2022-08-18 DIAGNOSIS — F411 Generalized anxiety disorder: Secondary | ICD-10-CM | POA: Diagnosis not present

## 2022-08-18 DIAGNOSIS — F4321 Adjustment disorder with depressed mood: Secondary | ICD-10-CM | POA: Diagnosis not present

## 2022-08-21 ENCOUNTER — Encounter: Payer: Self-pay | Admitting: *Deleted

## 2022-08-22 LAB — HM PAP SMEAR: HPV, high-risk: NEGATIVE

## 2022-08-24 ENCOUNTER — Encounter: Payer: Self-pay | Admitting: Family Medicine

## 2022-08-24 ENCOUNTER — Ambulatory Visit (INDEPENDENT_AMBULATORY_CARE_PROVIDER_SITE_OTHER): Payer: Medicare HMO | Admitting: Family Medicine

## 2022-08-24 VITALS — BP 126/80 | HR 78 | Temp 97.9°F | Resp 17 | Ht 68.0 in | Wt 131.2 lb

## 2022-08-24 DIAGNOSIS — F419 Anxiety disorder, unspecified: Secondary | ICD-10-CM

## 2022-08-24 DIAGNOSIS — R69 Illness, unspecified: Secondary | ICD-10-CM | POA: Diagnosis not present

## 2022-08-24 NOTE — Progress Notes (Signed)
   Subjective:    Patient ID: [name], female    DOB: [birth year], 67 y.o.   MRN: [id number]  HPI Anxiety- at last visit we increased Sertraline to  daily.  Pt feels that increased dose of medication has been helpful.  Has seen Ronalee Red twice- she feels that she is very good.  Has upcoming appt in May.  Pt feels that she is better.  'i don't want any more medicines'.     Review of Systems For ROS see HPI     Objective:   Physical Exam Vitals reviewed.  Constitutional:      General: She is not in acute distress.    Appearance: Normal appearance. She is not ill-appearing.  HENT:     Head: Normocephalic and atraumatic.  Skin:    General: Skin is warm and dry.  Neurological:     General: No focal deficit present.     Mental Status: She is alert and oriented to person, place, and time.  Psychiatric:        Mood and Affect: Mood normal.        Behavior: Behavior normal.        Thought Content: Thought content normal.           Assessment & Plan:

## 2022-08-24 NOTE — Assessment & Plan Note (Signed)
Pt feels that things are improving w/ the increased dose of Sertraline and with therapy.  She feels that since things are the way they are, she is just going to move forward and work on accepting them.  She also plans to devote more time and energy to her work with the [employer].  Will follow.

## 2022-08-24 NOTE — Patient Instructions (Signed)
Follow up in 6 months to recheck cholesterol No med changes at this time- you're doing great! Call with any questions or concerns Keep up the good work! Call with any questions or concerns!! YOU BRING SO MUCH JOY TO SO MANY!!

## 2022-08-29 ENCOUNTER — Encounter: Payer: Self-pay | Admitting: Family Medicine

## 2022-08-29 NOTE — Telephone Encounter (Signed)
Picture from patient.

## 2022-08-30 ENCOUNTER — Telehealth: Payer: Self-pay | Admitting: Family Medicine

## 2022-08-30 NOTE — Telephone Encounter (Signed)
Called patient to schedule Medicare Annual Wellness Visit (AWV). Left message for patient to call back and schedule Medicare Annual Wellness Visit (AWV).  Last date of AWV: 01/24/2022   Please schedule an AWVS appointment at any time with [facility] ANNUAL WELLNESS VISIT.  If any questions, please contact me at [phone].    Thank you,  [facility] Support [facility] Direct dial  [phone]

## 2022-09-04 ENCOUNTER — Other Ambulatory Visit (HOSPITAL_BASED_OUTPATIENT_CLINIC_OR_DEPARTMENT_OTHER): Payer: Self-pay | Admitting: Emergency Medicine

## 2022-09-04 ENCOUNTER — Ambulatory Visit (HOSPITAL_BASED_OUTPATIENT_CLINIC_OR_DEPARTMENT_OTHER)
Admission: RE | Admit: 2022-09-04 | Discharge: 2022-09-04 | Disposition: A | Discharge status: Home / Self Care | Payer: Medicare HMO | Source: Ambulatory Visit | Attending: Emergency Medicine | Admitting: Emergency Medicine

## 2022-09-04 DIAGNOSIS — T1490XA Injury, unspecified, initial encounter: Secondary | ICD-10-CM | POA: Diagnosis not present

## 2022-09-04 DIAGNOSIS — M7989 Other specified soft tissue disorders: Secondary | ICD-10-CM | POA: Diagnosis not present

## 2022-09-12 ENCOUNTER — Other Ambulatory Visit: Payer: Self-pay | Admitting: Family Medicine

## 2022-09-12 DIAGNOSIS — F419 Anxiety disorder, unspecified: Secondary | ICD-10-CM

## 2022-09-12 NOTE — Telephone Encounter (Signed)
Patient is requesting a refill of the following medications: Requested Prescriptions   Pending Prescriptions Disp Refills   LORazepam (ATIVAN) 2 MG tablet [Pharmacy Med Name: LORAZEPAM 2 MG TABLET] 30 tablet 0    Sig: TAKE 1 TABLET (2 MG TOTAL) BY MOUTH AT BEDTIME AS NEEDED FOR ANXIETY OR SLEEP. STOP TEMAZEPAM    Date of patient request: 09/12/2022 Last office visit: 08/24/2022 Date of last refill: 08/14/2022 Last refill amount: 30 Follow up time period per chart: 6 months

## 2022-09-19 ENCOUNTER — Telehealth: Payer: Self-pay | Admitting: Family Medicine

## 2022-09-19 NOTE — Telephone Encounter (Signed)
Called patient to schedule Medicare Annual Wellness Visit (AWV). Left message for patient to call back and schedule Medicare Annual Wellness Visit (AWV).  Last date of AWV: 01/24/2022   Please schedule an AWVS appointment at any time with [facility] ANNUAL WELLNESS VISIT.  If any questions, please contact me at [phone].    Thank you,  Rori Goar  [facility] Support [facility] Direct dial  [phone]   

## 2022-09-27 ENCOUNTER — Other Ambulatory Visit: Payer: Self-pay | Admitting: Family

## 2022-10-03 ENCOUNTER — Other Ambulatory Visit: Payer: Self-pay | Admitting: Family Medicine

## 2022-10-03 DIAGNOSIS — F419 Anxiety disorder, unspecified: Secondary | ICD-10-CM

## 2022-10-04 ENCOUNTER — Ambulatory Visit (INDEPENDENT_AMBULATORY_CARE_PROVIDER_SITE_OTHER): Payer: Medicare HMO | Admitting: *Deleted

## 2022-10-04 ENCOUNTER — Other Ambulatory Visit: Payer: Self-pay | Admitting: Family Medicine

## 2022-10-04 DIAGNOSIS — Z0000 Encounter for general adult medical examination without abnormal findings: Secondary | ICD-10-CM

## 2022-10-04 DIAGNOSIS — F419 Anxiety disorder, unspecified: Secondary | ICD-10-CM

## 2022-10-04 MED ORDER — LORAZEPAM 2 MG PO TABS
2.0000 mg | ORAL_TABLET | Freq: Every evening | ORAL | 0 refills | Status: DC | PRN
Start: 2022-10-04 — End: 2022-10-16

## 2022-10-04 NOTE — Patient Instructions (Signed)
Beth Frey , Thank you for taking time to come for your Medicare Wellness Visit. I appreciate your ongoing commitment to your health goals. Please review the following plan we discussed and let me know if I can assist you in the future.   Screening recommendations/referrals: Colonoscopy: up to date Mammogram: up to date Bone Density: Education provided Recommended yearly ophthalmology/optometry visit for glaucoma screening and checkup Recommended yearly dental visit for hygiene and checkup  Vaccinations: Influenza vaccine: up to date Pneumococcal vaccine: Education provided Tdap vaccine: up to date Shingles vaccine: Education provided    Advanced directives: yes not on file     Preventive Care 65 Years and Older, Female Preventive care refers to lifestyle choices and visits with your health care provider that can promote health and wellness. What does preventive care include? A yearly physical exam. This is also called an annual well check. Dental exams once or twice a year. Routine eye exams. Ask your health care provider how often you should have your eyes checked. Personal lifestyle choices, including: Daily care of your teeth and gums. Regular physical activity. Eating a healthy diet. Avoiding tobacco and drug use. Limiting alcohol use. Practicing safe sex. Taking low-dose aspirin every day. Taking vitamin and mineral supplements as recommended by your health care provider. What happens during an annual well check? The services and screenings done by your health care provider during your annual well check will depend on your age, overall health, lifestyle risk factors, and family history of disease. Counseling  Your health care provider may ask you questions about your: Alcohol use. Tobacco use. Drug use. Emotional well-being. Home and relationship well-being. Sexual activity. Eating habits. History of falls. Memory and ability to understand (cognition). Work and  work Astronomer. Reproductive health. Screening  You may have the following tests or measurements: Height, weight, and BMI. Blood pressure. Lipid and cholesterol levels. These may be checked every 5 years, or more frequently if you are over 39 years old. Skin check. Lung cancer screening. You may have this screening every year starting at age 45 if you have a 30-pack-year history of smoking and currently smoke or have quit within the past 15 years. Fecal occult blood test (FOBT) of the stool. You may have this test every year starting at age 59. Flexible sigmoidoscopy or colonoscopy. You may have a sigmoidoscopy every 5 years or a colonoscopy every 10 years starting at age 33. Hepatitis C blood test. Hepatitis B blood test. Sexually transmitted disease (STD) testing. Diabetes screening. This is done by checking your blood sugar (glucose) after you have not eaten for a while (fasting). You may have this done every 1-3 years. Bone density scan. This is done to screen for osteoporosis. You may have this done starting at age 23. Mammogram. This may be done every 1-2 years. Talk to your health care provider about how often you should have regular mammograms. Talk with your health care provider about your test results, treatment options, and if necessary, the need for more tests. Vaccines  Your health care provider may recommend certain vaccines, such as: Influenza vaccine. This is recommended every year. Tetanus, diphtheria, and acellular pertussis (Tdap, Td) vaccine. You may need a Td booster every 10 years. Zoster vaccine. You may need this after age 22. Pneumococcal 13-valent conjugate (PCV13) vaccine. One dose is recommended after age 7. Pneumococcal polysaccharide (PPSV23) vaccine. One dose is recommended after age 61. Talk to your health care provider about which screenings and vaccines you need and how  often you need them. This information is not intended to replace advice given to you  by your health care provider. Make sure you discuss any questions you have with your health care provider. Document Released: 05/21/2015 Document Revised: 01/12/2016 Document Reviewed: 02/23/2015 Elsevier Interactive Patient Education  2017 [employer].  Fall Prevention in the Home Falls can cause injuries. They can happen to people of all ages. There are many things you can do to make your home safe and to help prevent falls. What can I do on the outside of my home? Regularly fix the edges of walkways and driveways and fix any cracks. Remove anything that might make you trip as you walk through a door, such as a raised step or threshold. Trim any bushes or trees on the path to your home. Use bright outdoor lighting. Clear any walking paths of anything that might make someone trip, such as rocks or tools. Regularly check to see if handrails are loose or broken. Make sure that both sides of any steps have handrails. Any raised decks and porches should have guardrails on the edges. Have any leaves, snow, or ice cleared regularly. Use sand or salt on walking paths during winter. Clean up any spills in your garage right away. This includes oil or grease spills. What can I do in the bathroom? Use night lights. Install grab bars by the toilet and in the tub and shower. Do not use towel bars as grab bars. Use non-skid mats or decals in the tub or shower. If you need to sit down in the shower, use a plastic, non-slip stool. Keep the floor dry. Clean up any water that spills on the floor as soon as it happens. Remove soap buildup in the tub or shower regularly. Attach bath mats securely with double-sided non-slip rug tape. Do not have throw rugs and other things on the floor that can make you trip. What can I do in the bedroom? Use night lights. Make sure that you have a light by your bed that is easy to reach. Do not use any sheets or blankets that are too big for your bed. They should not  hang down onto the floor. Have a firm chair that has side arms. You can use this for support while you get dressed. Do not have throw rugs and other things on the floor that can make you trip. What can I do in the kitchen? Clean up any spills right away. Avoid walking on wet floors. Keep items that you use a lot in easy-to-reach places. If you need to reach something above you, use a strong step stool that has a grab bar. Keep electrical cords out of the way. Do not use floor polish or wax that makes floors slippery. If you must use wax, use non-skid floor wax. Do not have throw rugs and other things on the floor that can make you trip. What can I do with my stairs? Do not leave any items on the stairs. Make sure that there are handrails on both sides of the stairs and use them. Fix handrails that are broken or loose. Make sure that handrails are as long as the stairways. Check any carpeting to make sure that it is firmly attached to the stairs. Fix any carpet that is loose or worn. Avoid having throw rugs at the top or bottom of the stairs. If you do have throw rugs, attach them to the floor with carpet tape. Make sure that you have a  light switch at the top of the stairs and the bottom of the stairs. If you do not have them, ask someone to add them for you. What else can I do to help prevent falls? Wear shoes that: Do not have high heels. Have rubber bottoms. Are comfortable and fit you well. Are closed at the toe. Do not wear sandals. If you use a stepladder: Make sure that it is fully opened. Do not climb a closed stepladder. Make sure that both sides of the stepladder are locked into place. Ask someone to hold it for you, if possible. Clearly mark and make sure that you can see: Any grab bars or handrails. First and last steps. Where the edge of each step is. Use tools that help you move around (mobility aids) if they are needed. These  include: Canes. Walkers. Scooters. Crutches. Turn on the lights when you go into a dark area. Replace any light bulbs as soon as they burn out. Set up your furniture so you have a clear path. Avoid moving your furniture around. If any of your floors are uneven, fix them. If there are any pets around you, be aware of where they are. Review your medicines with your doctor. Some medicines can make you feel dizzy. This can increase your chance of falling. Ask your doctor what other things that you can do to help prevent falls. This information is not intended to replace advice given to you by your health care provider. Make sure you discuss any questions you have with your health care provider. Document Released: 02/18/2009 Document Revised: 09/30/2015 Document Reviewed: 05/29/2014 Elsevier Interactive Patient Education  2017 [employer].

## 2022-10-04 NOTE — Progress Notes (Signed)
Subjective:   Beth Frey is a 67 y.o. female who presents for Medicare Annual (Subsequent) preventive examination.  I connected with  Beth Frey on 10/04/22 by a telephone enabled telemedicine application and verified that I am speaking with the correct person using two identifiers.   I discussed the limitations of evaluation and management by telemedicine. The patient expressed understanding and agreed to proceed.  Patient location: home  Provider location: telephone/home   Review of Systems     Cardiac Risk Factors include: advanced age (>2men, >66 women)     Objective:    Today's Vitals   There is no height or weight on file to calculate BMI.     10/04/2022    8:04 AM 01/24/2022    9:16 AM 02/19/2020    7:38 PM 11/04/2019    6:00 PM 11/04/2019    4:57 PM 10/21/2017    8:31 PM 01/20/2016   10:22 PM  Advanced Directives  Does Patient Have a Medical Advance Directive? Yes Yes No Yes Yes No Yes  Type of Diplomatic Services operational officer Living will  Healthcare Power of [place];Living will Healthcare Power of [place];Living will  Living will  Does patient want to make changes to medical advance directive? No - Patient declined No - Patient declined  No - Patient declined     Copy of Healthcare Power of Attorney in Chart?    No - copy requested No - copy requested  No - copy requested  Would patient like information on creating a medical advance directive?      No - Patient declined     Current Medications (verified) Outpatient Encounter Medications as of 10/04/2022  Medication Sig   celecoxib (CELEBREX) 200 MG capsule TAKE 1 CAPSULE BY MOUTH EVERY DAY   EPINEPHrine 0.3 mg/0.3 mL IJ SOAJ injection Inject 0.3 mg into the muscle as needed.   LORazepam (ATIVAN) 2 MG tablet TAKE 1 TABLET (2 MG TOTAL) BY MOUTH AT BEDTIME AS NEEDED FOR ANXIETY OR SLEEP. STOP TEMAZEPAM   Multiple Vitamin (MULTIVITAMIN WITH MINERALS) TABS tablet Take 1 tablet by mouth daily.    potassium chloride (MICRO-K) 10 MEQ CR capsule TAKE 1 CAPSULE BY MOUTH THREE TIMES A DAY   rosuvastatin (CRESTOR) 10 MG tablet Take 1 tablet (10 mg total) by mouth daily.   sertraline (ZOLOFT) 100 MG tablet Take 1 tablet (100 mg total) by mouth daily.   No facility-administered encounter medications on file as of 10/04/2022.    Allergies (verified) Fish allergy, Sulfa antibiotics, Doxycycline, Remeron [mirtazapine], Trazodone and nefazodone, and Vancomycin   History: Past Medical History:  Diagnosis Date   Anxiety    Back pain, chronic    Benign essential HTN 11/28/2019   Cancer (HCC)    skin   Cecal bascule (HCC)    Osteoarthritis    Past Surgical History:  Procedure Laterality Date   AUGMENTATION MAMMAPLASTY     Pt has had explants   BREAST SURGERY  August 2011   Implants removed   COLON SURGERY  02/20/2010   Right hemicolectomy-cecal bascule   ELBOW SURGERY     left elbow   JOINT REPLACEMENT  2006   Left total knee replacement   OVARIAN CYST REMOVAL     right   SKIN CANCER EXCISION     multiple-SCC and BCC   TUBAL LIGATION     Uterine ablation     WRIST SURGERY  2009   right wrist   Family History  Problem  Relation Age of Onset   Cancer Mother        multiple myeloma   Parkinson's disease Mother    Cancer Father        stomach cancer   Cancer Brother        pancreatic cancer   Stroke Maternal Grandmother    Cancer Maternal Grandfather        lung cancer   Cancer Paternal Grandmother        ovarian   Heart disease Paternal Grandfather    Cancer Brother        multiple myeloma   Social History   Socioeconomic History   Marital status: Married    Spouse name: Not on file   Number of children: Not on file   Years of education: Not on file   Highest education level: Not on file  Occupational History   Not on file  Tobacco Use   Smoking status: Never   Smokeless tobacco: Never  Vaping Use   Vaping Use: Never used  Substance and Sexual Activity    Alcohol use: No    Alcohol/week: 0.0 standard drinks of alcohol   Drug use: No   Sexual activity: Not Currently    Comment: married  Other Topics Concern   Not on file  Social History Narrative   Not on file   Social Determinants of Health   Financial Resource Strain: Low Risk  (10/04/2022)   Overall Financial Resource Strain (CARDIA)    Difficulty of Paying Living Expenses: Not hard at all  Food Insecurity: No Food Insecurity (10/04/2022)   Hunger Vital Sign    Worried About Running Out of Food in the Last Year: Never true    Ran Out of Food in the Last Year: Never true  Transportation Needs: No Transportation Needs (10/04/2022)   PRAPARE - Administrator, Civil Service (Medical): No    Lack of Transportation (Non-Medical): No  Physical Activity: Sufficiently Active (10/04/2022)   Exercise Vital Sign    Days of Exercise per Week: 4 days    Minutes of Exercise per Session: 40 min  Stress: Stress Concern Present (10/04/2022)   [employer] of Occupational Health - Occupational Stress Questionnaire    Feeling of Stress : To some extent  Social Connections: Socially Integrated (10/04/2022)   Social Connection and Isolation Panel [NHANES]    Frequency of Communication with Friends and Family: More than three times a week    Frequency of Social Gatherings with Friends and Family: Once a week    Attends Religious Services: More than 4 times per year    Active Member of [employer] or Organizations: Yes    Attends Engineer, structural: More than 4 times per year    Marital Status: Married    Tobacco Counseling Counseling given: Not Answered   Clinical Intake:  Pre-visit preparation completed: Yes  Pain : No/denies pain     Diabetes: No  How often do you need to have someone help you when you read instructions, pamphlets, or other written materials from your doctor or pharmacy?: 1 - Never  Diabetic?  no  Interpreter Needed?: No  Information entered by  :: Remi Haggard LPN   Activities of Daily Living    10/04/2022    8:11 AM 07/13/2022    8:49 AM  In your present state of health, do you have any difficulty performing the following activities:  Hearing? 0 0  Vision? 0 0  Difficulty concentrating or  making decisions? 0 0  Walking or climbing stairs? 0 0  Dressing or bathing? 0 0  Doing errands, shopping? 0 0  Preparing Food and eating ? N   Using the Toilet? N   In the past six months, have you accidently leaked urine? N   Do you have problems with loss of bowel control? N   Managing your Medications? N   Managing your Finances? N   Housekeeping or managing your Housekeeping? N     Patient Care Team: Sheliah Hatch, MD as PCP - General (Family Medicine) Bernette Redbird, MD (Gastroenterology) Dohmeier, Porfirio Mylar, MD (Neurology) Bradly Bienenstock, MD (Orthopedic Surgery) Williford, Talmadge Coventry, MD as Referring Physician (Dermatology) Monna Fam, MD (General Practice)  Indicate any recent Medical Services you may have received from other than [facility] providers in the past year (date may be approximate).     Assessment:   This is a routine wellness examination for Jasmarie.  Hearing/Vision screen Hearing Screening - Comments:: No trouble hearing Vision Screening - Comments:: Up to date bart  Dietary issues and exercise activities discussed: Current Exercise Habits: Home exercise routine, Type of exercise: walking, Time (Minutes): 40, Frequency (Times/Week): 4, Weekly Exercise (Minutes/Week): 160, Intensity: Mild   Goals Addressed             This Visit's Progress    Patient Stated       Maintain current lifestyle       Depression Screen    10/04/2022    8:09 AM 08/24/2022    9:15 AM 07/13/2022    8:49 AM 01/24/2022    9:17 AM 10/24/2021    8:26 AM 07/11/2021    8:49 AM 05/31/2021   10:17 AM  PHQ 2/9 Scores  PHQ - 2 Score 0 0 0 0 0 0 0  PHQ- 9 Score 2 3 6   0 1 2    Fall Risk    10/04/2022    8:03 AM 08/24/2022    9:15  AM 07/13/2022    8:49 AM 01/24/2022    9:16 AM 10/24/2021    8:26 AM  Fall Risk   Falls in the past year? 0 0 0 0 0  Number falls in past yr: 0 0 0 0 0  Injury with Fall? 0 0 0 0 0  Risk for fall due to :  No Fall Risks No Fall Risks No Fall Risks No Fall Risks  Follow up Falls evaluation completed;Education provided;Falls prevention discussed Falls evaluation completed Falls evaluation completed Falls evaluation completed Falls evaluation completed    FALL RISK PREVENTION PERTAINING TO THE HOME:  Any stairs in or around the home? Yes  If so, are there any without handrails? No  Home free of loose throw rugs in walkways, pet beds, electrical cords, etc? Yes  Adequate lighting in your home to reduce risk of falls? Yes   ASSISTIVE DEVICES UTILIZED TO PREVENT FALLS:  Life alert? No  Use of a cane, walker or w/c? No  Grab bars in the bathroom? No  Shower chair or bench in shower? Yes  Elevated toilet seat or a handicapped toilet? No   TIMED UP AND GO:  Was the test performed? No .    Cognitive Function:        10/04/2022    8:06 AM  6CIT Screen  What Year? 0 points  What month? 0 points  What time? 0 points  Count back from 20 0 points  Months in reverse 0 points  Repeat  phrase 0 points  Total Score 0 points    Immunizations Immunization History  Administered Date(s) Administered   PFIZER(Purple Top)SARS-COV-2 Vaccination 01/08/2020, 02/08/2020   Tdap 10/20/2013    TDAP status: Up to date  Flu Vaccine status: Up to date  Pneumococcal vaccine status: Due, Education has been provided regarding the importance of this vaccine. Advised may receive this vaccine at local pharmacy or Health Dept. Aware to provide a copy of the vaccination record if obtained from local pharmacy or Health Dept. Verbalized acceptance and understanding.  Covid-19 vaccine status: Information provided on how to obtain vaccines.   Qualifies for Shingles Vaccine? Yes   Zostavax completed No    Shingrix Completed?: No.    Education has been provided regarding the importance of this vaccine. Patient has been advised to call insurance company to determine out of pocket expense if they have not yet received this vaccine. Advised may also receive vaccine at local pharmacy or Health Dept. Verbalized acceptance and understanding.  Screening Tests Health Maintenance  Topic Date Due   Zoster Vaccines- Shingrix (1 of 2) 10/13/2022 (Originally 09/16/1974)   Pneumonia Vaccine 36+ Years old (1 of 1 - PCV) 10/25/2022 (Originally 09/15/2021)   INFLUENZA VACCINE  12/07/2022   Medicare Annual Wellness (AWV)  10/04/2023   DTaP/Tdap/Td (2 - Td or Tdap) 10/21/2023   MAMMOGRAM  06/09/2024   Colonoscopy  06/30/2029   DEXA SCAN  Completed   Hepatitis C Screening  Completed   HPV VACCINES  Aged Out   COVID-19 Vaccine  Discontinued    Health Maintenance  There are no preventive care reminders to display for this patient.  Colorectal cancer screening: Type of screening: Colonoscopy. Completed 2021. Repeat every 10 years  Mammogram status: Completed 2024. Repeat every year  Bone Density 2016 will schedule with mammogram 2025  Lung Cancer Screening: (Low Dose CT Chest recommended if Age 72-80 years, 30 pack-year currently smoking OR have quit w/in 15years.) does not qualify.   Lung Cancer Screening Referral:   Additional Screening:  Hepatitis C Screening: does not qualify; Completed 2017  Vision Screening: Recommended annual ophthalmology exams for early detection of glaucoma and other disorders of the eye. Is the patient up to date with their annual eye exam?  Yes  Who is the provider or what is the name of the office in which the patient attends annual eye exams? [facility] If pt is not established with a provider, would they like to be referred to a provider to establish care? No .   Dental Screening: Recommended annual dental exams for proper oral hygiene  Community Resource Referral /  Chronic Care Management: CRR required this visit?  No   CCM required this visit?  No      Plan:     I have personally reviewed and noted the following in the patient's chart:   Medical and social history Use of alcohol, tobacco or illicit drugs  Current medications and supplements including opioid prescriptions. Patient is not currently taking opioid prescriptions. Functional ability and status Nutritional status Physical activity Advanced directives List of other physicians Hospitalizations, surgeries, and ER visits in previous 12 months Vitals Screenings to include cognitive, depression, and falls Referrals and appointments  In addition, I have reviewed and discussed with patient certain preventive protocols, quality metrics, and best practice recommendations. A written personalized care plan for preventive services as well as general preventive health recommendations were provided to patient.     Remi Haggard, LPN   1/61/0960  Nurse Notes:

## 2022-10-04 NOTE — Telephone Encounter (Signed)
Ativan 2 mg LOV: 08/24/22 Last Refill:09/13/22 Upcoming appt: 02/21/23

## 2022-10-05 NOTE — Telephone Encounter (Signed)
Pt aware of refill.

## 2022-10-16 ENCOUNTER — Encounter: Payer: Self-pay | Admitting: Family Medicine

## 2022-10-16 ENCOUNTER — Other Ambulatory Visit: Payer: Self-pay | Admitting: Family Medicine

## 2022-10-16 DIAGNOSIS — F419 Anxiety disorder, unspecified: Secondary | ICD-10-CM

## 2022-10-16 NOTE — Telephone Encounter (Signed)
Ativan 2 mg  LOV: 08/24/22 Last Refill:10/04/22 Upcoming appt: 02/21/23  I am not sure why the pharmacy sent this again . And it appears we stopped the Zoloft but it still a active medication

## 2022-10-16 NOTE — Telephone Encounter (Signed)
Left vm stating refill sent in

## 2022-11-20 ENCOUNTER — Other Ambulatory Visit: Payer: Self-pay | Admitting: Family Medicine

## 2022-11-20 DIAGNOSIS — F419 Anxiety disorder, unspecified: Secondary | ICD-10-CM

## 2022-11-20 MED ORDER — LORAZEPAM 2 MG PO TABS
2.0000 mg | ORAL_TABLET | Freq: Every evening | ORAL | 3 refills | Status: DC | PRN
Start: 2022-11-20 — End: 2023-02-01

## 2022-11-20 NOTE — Telephone Encounter (Signed)
Left Vm stating refill

## 2022-11-20 NOTE — Telephone Encounter (Signed)
Ativan 2 mg LOV: 08/24/22 Last Refill:10/16/22 Upcoming appt: 02/21/23

## 2022-12-07 ENCOUNTER — Encounter (HOSPITAL_COMMUNITY): Payer: Self-pay | Admitting: Emergency Medicine

## 2022-12-07 ENCOUNTER — Other Ambulatory Visit: Payer: Self-pay

## 2022-12-07 ENCOUNTER — Emergency Department (HOSPITAL_COMMUNITY)
Admission: EM | Admit: 2022-12-07 | Discharge: 2022-12-07 | Disposition: A | Discharge status: Home / Self Care | Payer: Medicare HMO | Attending: Emergency Medicine | Admitting: Emergency Medicine

## 2022-12-07 DIAGNOSIS — H43812 Vitreous degeneration, left eye: Secondary | ICD-10-CM | POA: Insufficient documentation

## 2022-12-07 DIAGNOSIS — H43393 Other vitreous opacities, bilateral: Secondary | ICD-10-CM | POA: Diagnosis not present

## 2022-12-07 DIAGNOSIS — H5789 Other specified disorders of eye and adnexa: Secondary | ICD-10-CM | POA: Diagnosis not present

## 2022-12-07 DIAGNOSIS — H43823 Vitreomacular adhesion, bilateral: Secondary | ICD-10-CM | POA: Diagnosis not present

## 2022-12-07 DIAGNOSIS — H35372 Puckering of macula, left eye: Secondary | ICD-10-CM | POA: Diagnosis not present

## 2022-12-07 DIAGNOSIS — H43813 Vitreous degeneration, bilateral: Secondary | ICD-10-CM | POA: Diagnosis not present

## 2022-12-07 DIAGNOSIS — H35362 Drusen (degenerative) of macula, left eye: Secondary | ICD-10-CM | POA: Diagnosis not present

## 2022-12-07 MED ORDER — TETRACAINE HCL 0.5 % OP SOLN
1.0000 [drp] | Freq: Once | OPHTHALMIC | Status: AC
  Administered 2022-12-07: 1 [drp] via OPHTHALMIC
  Filled 2022-12-07: qty 4

## 2022-12-07 NOTE — ED Provider Notes (Signed)
[place] EMERGENCY DEPARTMENT AT [facility] Provider Note   CSN: [id number] Arrival date & time: 12/07/22  0443     History  Chief Complaint  Patient presents with   Eye Problem    Beth Frey is a 67 y.o. female.  The history is provided by the patient.  Eye Problem Beth Frey is a 66 y.o. female who presents to the Emergency Department complaining of eye problem.  She presents to the emergency department for evaluation of vision changes in her left eye that started abruptly at 4 PM.  She states that she was in her routine state of health when she bent over to feed the cat she noticed that she could not see well out of her left eye.  She thought that she had a smudge on her glasses and attempted to clean it, but continued to not be able to see.  She describes it as a black feather that moves around in her vision only in her left eye.  No associated pain.  She does have significant anxiety secondary to to the vision change.  No additional chest pain, headache, fevers, difficulty breathing, nausea, vomiting, numbness, weakness.  No history of high blood pressure.  No prior similar symptoms.     Home Medications Prior to Admission medications   Medication Sig Start Date End Date Taking? Authorizing Provider  celecoxib (CELEBREX) 200 MG capsule TAKE 1 CAPSULE BY MOUTH EVERY DAY 07/07/22  Yes Sheliah Hatch, MD  EPINEPHrine 0.3 mg/0.3 mL IJ SOAJ injection Inject 0.3 mg into the muscle as needed. 01/25/22  Yes Sheliah Hatch, MD  LORazepam (ATIVAN) 2 MG tablet Take 1 tablet (2 mg total) by mouth at bedtime as needed for anxiety or sleep. STOP TEMAZEPAM 11/20/22  Yes Sheliah Hatch, MD  Multiple Vitamin (MULTIVITAMIN WITH MINERALS) TABS tablet Take 1 tablet by mouth daily.   Yes [provider]  Nutritional Supplements (JUICE PLUS FIBRE PO) Take 1 tablet by mouth daily.   Yes [provider]  potassium chloride (MICRO-K) 10 MEQ CR capsule TAKE 1  CAPSULE BY MOUTH THREE TIMES A DAY 01/01/22  Yes Worthy Rancher B, FNP  rosuvastatin (CRESTOR) 10 MG tablet TAKE 1 TABLET BY MOUTH EVERY DAY 10/16/22  Yes Sheliah Hatch, MD  sertraline (ZOLOFT) 100 MG tablet Take 1 tablet (100 mg total) by mouth daily. 07/13/22  Yes Sheliah Hatch, MD      Allergies    Fish allergy, Sulfa antibiotics, Doxycycline, Remeron [mirtazapine], Trazodone and nefazodone, and Vancomycin    Review of Systems   Review of Systems  All other systems reviewed and are negative.   Physical Exam Updated Vital Signs BP (!) 179/110 (BP Location: Left Arm)   Pulse 87   Temp 98.8 F (37.1 C) (Oral)   Resp 18   Ht 5' 7.5" (1.715 m)   Wt 61.2 kg   LMP  (LMP Unknown)   SpO2 100%   BMI 20.83 kg/m  Physical Exam Vitals and nursing note reviewed.  Constitutional:      Appearance: She is well-developed.  HENT:     Head: Normocephalic and atraumatic.     Comments: Right eye pressure of 20, left eye pressure of 22.  No conjunctival injection.  There is floating debris in the left posterior chamber Eyes:     Extraocular Movements: Extraocular movements intact.     Conjunctiva/sclera: Conjunctivae normal.     Pupils: Pupils are equal, round, and reactive to light.  Cardiovascular:     Rate and Rhythm: Normal rate and regular rhythm.     Heart sounds: No murmur heard. Pulmonary:     Effort: Pulmonary effort is normal. No respiratory distress.     Breath sounds: Normal breath sounds.  Abdominal:     Palpations: Abdomen is soft.     Tenderness: There is no abdominal tenderness. There is no guarding or rebound.  Musculoskeletal:        General: No tenderness.  Skin:    General: Skin is warm and dry.  Neurological:     Mental Status: She is alert and oriented to person, place, and time.     Comments: Visual fields grossly intact, EOMI.  No asymmetry of facial movements.  5 out of 5 strength in all 4 extremities with sensation to light touch intact in all 4  extremities.  No pronator drift.  Psychiatric:        Behavior: Behavior normal.     ED Results / Procedures / Treatments   Labs (all labs ordered are listed, but only abnormal results are displayed) Labs Reviewed - No data to display  EKG None  Radiology No results found.  Procedures Procedures    Medications Ordered in ED Medications  tetracaine (PONTOCAINE) 0.5 % ophthalmic solution 1 drop (1 drop Both Eyes Given 12/07/22 3295)    ED Course/ Medical Decision Making/ A&P                                 Medical Decision Making Risk Prescription drug management.   Patient without significant medical history here for evaluation of vision changes in the left eye.  She is neurologically intact on evaluation.  Current presentation is not consistent with CVA, central retinal artery occlusion, acute angle-closure glaucoma.  Concern for vitreous detachment versus vitreous hemorrhage.  Discussed with Dr. Essie Hart with ophthalmology-plan to have her evaluated in the office later today.  Patient did have elevated blood pressure on ED arrival, no history of hypertension.  She does report feeling very anxious.  Presentation is not consistent with hypertensive urgency.        Final Clinical Impression(s) / ED Diagnoses Final diagnoses:  Vitreous detachment of left eye    Rx / DC Orders ED Discharge Orders     None         Tilden Fossa, MD 12/07/22 [phone]

## 2022-12-07 NOTE — ED Triage Notes (Signed)
Patient coming to ED for evaluation of visual changes to L eye.  States she noticed changes around 4 AM.  Describes it "like a feather."  No reports of weakness, numbness, tingling, changes in speech.

## 2022-12-07 NOTE — ED Notes (Signed)
Visual Acuity: Rt Eye-20/25; L Eye-20/30; Both 20/20

## 2022-12-14 ENCOUNTER — Telehealth: Payer: Self-pay

## 2022-12-14 NOTE — Telephone Encounter (Signed)
Transition Care Management Unsuccessful Follow-up Telephone Call  Date of discharge and from where:  Beth Frey 8/1  Attempts:  1st Attempt  Reason for unsuccessful TCM follow-up call:  No answer/busy   [name] [facility] Guide, [facility] [phone] [address] [place], [place], [place] [address] Phone: [phone] Email: [email or web address]

## 2022-12-14 NOTE — Telephone Encounter (Signed)
Transition Care Management Unsuccessful Follow-up Telephone Call  Date of discharge and from where:  Beth Frey 8/1  Attempts:  2nd Attempt  Reason for unsuccessful TCM follow-up call:  No answer/busy   [name] [facility] Guide, [facility] [phone] [address] [place], [place], [place] [address] Phone: [phone] Email: [email or web address]

## 2022-12-16 DIAGNOSIS — Z823 Family history of stroke: Secondary | ICD-10-CM | POA: Diagnosis not present

## 2022-12-16 DIAGNOSIS — E785 Hyperlipidemia, unspecified: Secondary | ICD-10-CM | POA: Diagnosis not present

## 2022-12-16 DIAGNOSIS — Z8249 Family history of ischemic heart disease and other diseases of the circulatory system: Secondary | ICD-10-CM | POA: Diagnosis not present

## 2022-12-16 DIAGNOSIS — I499 Cardiac arrhythmia, unspecified: Secondary | ICD-10-CM | POA: Diagnosis not present

## 2022-12-16 DIAGNOSIS — F419 Anxiety disorder, unspecified: Secondary | ICD-10-CM | POA: Diagnosis not present

## 2022-12-16 DIAGNOSIS — K219 Gastro-esophageal reflux disease without esophagitis: Secondary | ICD-10-CM | POA: Diagnosis not present

## 2022-12-16 DIAGNOSIS — I10 Essential (primary) hypertension: Secondary | ICD-10-CM | POA: Diagnosis not present

## 2022-12-16 DIAGNOSIS — E876 Hypokalemia: Secondary | ICD-10-CM | POA: Diagnosis not present

## 2022-12-16 DIAGNOSIS — Z809 Family history of malignant neoplasm, unspecified: Secondary | ICD-10-CM | POA: Diagnosis not present

## 2022-12-16 DIAGNOSIS — M2022 Hallux rigidus, left foot: Secondary | ICD-10-CM | POA: Diagnosis not present

## 2022-12-16 DIAGNOSIS — H269 Unspecified cataract: Secondary | ICD-10-CM | POA: Diagnosis not present

## 2022-12-16 DIAGNOSIS — M1990 Unspecified osteoarthritis, unspecified site: Secondary | ICD-10-CM | POA: Diagnosis not present

## 2022-12-26 ENCOUNTER — Other Ambulatory Visit: Payer: Self-pay | Admitting: Family Medicine

## 2023-01-11 ENCOUNTER — Other Ambulatory Visit: Payer: Self-pay | Admitting: Family Medicine

## 2023-01-26 DIAGNOSIS — M170 Bilateral primary osteoarthritis of knee: Secondary | ICD-10-CM | POA: Diagnosis not present

## 2023-01-29 ENCOUNTER — Encounter: Payer: Self-pay | Admitting: Family Medicine

## 2023-01-29 DIAGNOSIS — F419 Anxiety disorder, unspecified: Secondary | ICD-10-CM

## 2023-01-31 ENCOUNTER — Telehealth: Payer: Self-pay | Admitting: Family Medicine

## 2023-01-31 NOTE — Telephone Encounter (Signed)
[facility] Comprehensive Chart Note   Placed in front bin

## 2023-01-31 NOTE — Telephone Encounter (Signed)
Placed in folder

## 2023-02-01 MED ORDER — POTASSIUM CHLORIDE ER 10 MEQ PO CPCR: ORAL_CAPSULE | ORAL | 2 refills | Status: DC

## 2023-02-01 MED ORDER — LORAZEPAM 2 MG PO TABS
2.0000 mg | ORAL_TABLET | Freq: Every evening | ORAL | 3 refills | Status: DC | PRN
Start: 2023-02-01 — End: 2023-06-18

## 2023-02-01 NOTE — Telephone Encounter (Signed)
Reviewed and placed in scan.  No action required

## 2023-02-01 NOTE — Telephone Encounter (Signed)
Patient is requesting a refill of the following medications: Requested Prescriptions   Pending Prescriptions Disp Refills   LORazepam (ATIVAN) 2 MG tablet 30 tablet 3    Sig: Take 1 tablet (2 mg total) by mouth at bedtime as needed for anxiety or sleep. STOP TEMAZEPAM   potassium chloride (MICRO-K) 10 MEQ CR capsule 270 capsule 2    Sig: TAKE 1 CAPSULE BY MOUTH THREE TIMES A DAY    Date of patient request: 02/01/23 Last office visit: 08/24/22 Date of last refill: 11/20/22 Last refill amount: 30 Follow up time period per chart: 6 months

## 2023-02-06 DIAGNOSIS — H2513 Age-related nuclear cataract, bilateral: Secondary | ICD-10-CM | POA: Diagnosis not present

## 2023-02-06 DIAGNOSIS — H52223 Regular astigmatism, bilateral: Secondary | ICD-10-CM | POA: Diagnosis not present

## 2023-02-06 DIAGNOSIS — H35362 Drusen (degenerative) of macula, left eye: Secondary | ICD-10-CM | POA: Diagnosis not present

## 2023-02-06 DIAGNOSIS — H5213 Myopia, bilateral: Secondary | ICD-10-CM | POA: Diagnosis not present

## 2023-02-06 DIAGNOSIS — H43813 Vitreous degeneration, bilateral: Secondary | ICD-10-CM | POA: Diagnosis not present

## 2023-02-06 DIAGNOSIS — H524 Presbyopia: Secondary | ICD-10-CM | POA: Diagnosis not present

## 2023-02-21 ENCOUNTER — Encounter: Payer: Self-pay | Admitting: Family Medicine

## 2023-02-21 ENCOUNTER — Ambulatory Visit (INDEPENDENT_AMBULATORY_CARE_PROVIDER_SITE_OTHER): Payer: Medicare HMO | Admitting: Family Medicine

## 2023-02-21 VITALS — BP 118/70 | HR 80 | Temp 97.8°F | Ht 67.5 in | Wt 141.1 lb

## 2023-02-21 DIAGNOSIS — E782 Mixed hyperlipidemia: Secondary | ICD-10-CM | POA: Diagnosis not present

## 2023-02-21 LAB — HEPATIC FUNCTION PANEL
ALT: 28 U/L (ref 0–35)
AST: 28 U/L (ref 0–37)
Albumin: 4.4 g/dL (ref 3.5–5.2)
Alkaline Phosphatase: 37 U/L — ABNORMAL LOW (ref 39–117)
Bilirubin, Direct: 0.1 mg/dL (ref 0.0–0.3)
Total Bilirubin: 0.6 mg/dL (ref 0.2–1.2)
Total Protein: 7.5 g/dL (ref 6.0–8.3)

## 2023-02-21 LAB — CBC WITH DIFFERENTIAL/PLATELET
Basophils Absolute: 0 10*3/uL (ref 0.0–0.1)
Basophils Relative: 0.6 % (ref 0.0–3.0)
Eosinophils Absolute: 0.1 10*3/uL (ref 0.0–0.7)
Eosinophils Relative: 1.8 % (ref 0.0–5.0)
HCT: 43.3 % (ref 36.0–46.0)
Hemoglobin: 14.2 g/dL (ref 12.0–15.0)
Lymphocytes Relative: 38.4 % (ref 12.0–46.0)
Lymphs Abs: 2.8 10*3/uL (ref 0.7–4.0)
MCHC: 32.9 g/dL (ref 30.0–36.0)
MCV: 92.5 fL (ref 78.0–100.0)
Monocytes Absolute: 0.5 10*3/uL (ref 0.1–1.0)
Monocytes Relative: 7.4 % (ref 3.0–12.0)
Neutro Abs: 3.8 10*3/uL (ref 1.4–7.7)
Neutrophils Relative %: 51.8 % (ref 43.0–77.0)
Platelets: 269 10*3/uL (ref 150.0–400.0)
RBC: 4.69 Mil/uL (ref 3.87–5.11)
RDW: 13.5 % (ref 11.5–15.5)
WBC: 7.2 10*3/uL (ref 4.0–10.5)

## 2023-02-21 LAB — BASIC METABOLIC PANEL
BUN: 10 mg/dL (ref 6–23)
CO2: 28 meq/L (ref 19–32)
Calcium: 10 mg/dL (ref 8.4–10.5)
Chloride: 104 meq/L (ref 96–112)
Creatinine, Ser: 0.74 mg/dL (ref 0.40–1.20)
GFR: 84.3 mL/min (ref >60.00)
Glucose, Bld: 104 mg/dL — ABNORMAL HIGH (ref 70–99)
Potassium: 3.9 meq/L (ref 3.5–5.1)
Sodium: 140 meq/L (ref 135–145)

## 2023-02-21 LAB — LIPID PANEL
Cholesterol: 192 mg/dL (ref 0–200)
HDL: 82.8 mg/dL (ref >39.00)
LDL Cholesterol: 92 mg/dL (ref 0–99)
NonHDL: 109.43
Total CHOL/HDL Ratio: 2
Triglycerides: 88 mg/dL (ref 0.0–149.0)
VLDL: 17.6 mg/dL (ref 0.0–40.0)

## 2023-02-21 LAB — TSH: TSH: 1.73 u[IU]/mL (ref 0.35–5.50)

## 2023-02-21 NOTE — Progress Notes (Signed)
   Subjective:    Patient ID: Beth Frey, female    DOB: 02-04-1956, 67 y.o.   MRN: [id number]  HPI Hyperlipidemia- chronic problem, on Crestor 10mg  daily.  Just returned from [place] and ate quite a bit of cheese, croissants, etc.  Denies CP, SOB, abd pain, N/V.   Review of Systems For ROS see HPI     Objective:   Physical Exam Vitals reviewed.  Constitutional:      General: She is not in acute distress.    Appearance: Normal appearance. She is well-developed. She is not ill-appearing.  HENT:     Head: Normocephalic and atraumatic.  Eyes:     Conjunctiva/sclera: Conjunctivae normal.     Pupils: Pupils are equal, round, and reactive to light.  Neck:     Thyroid: No thyromegaly.  Cardiovascular:     Rate and Rhythm: Normal rate and regular rhythm.     Pulses: Normal pulses.     Heart sounds: Normal heart sounds. No murmur heard. Pulmonary:     Effort: Pulmonary effort is normal. No respiratory distress.     Breath sounds: Normal breath sounds.  Abdominal:     General: There is no distension.     Palpations: Abdomen is soft.     Tenderness: There is no abdominal tenderness.  Musculoskeletal:     Cervical back: Normal range of motion and neck supple.     Right lower leg: No edema.     Left lower leg: No edema.  Lymphadenopathy:     Cervical: No cervical adenopathy.  Skin:    General: Skin is warm and dry.  Neurological:     General: No focal deficit present.     Mental Status: She is alert and oriented to person, place, and time.  Psychiatric:        Mood and Affect: Mood normal.        Behavior: Behavior normal.        Thought Content: Thought content normal.           Assessment & Plan:

## 2023-02-21 NOTE — Assessment & Plan Note (Signed)
Chronic problem.  Tolerating Crestor w/o difficulty.  She has been in [place] and admits that diet has been high in fat recently.  Check labs but will consider this when deciding on medication adjustments.

## 2023-02-21 NOTE — Patient Instructions (Signed)
Schedule your complete physical in 6 months We'll notify you of your lab results and make any changes if needed Keep up the good work on healthy diet and regular exercise- you look great! Call with any questions or concerns Stay Safe!  Stay Healthy! Happy Fall!!!

## 2023-02-22 ENCOUNTER — Telehealth: Payer: Self-pay

## 2023-02-22 NOTE — Telephone Encounter (Signed)
-----   Message from Neena Rhymes sent at 02/22/2023  7:33 AM EDT ----- Labs look great!  No changes at this time

## 2023-02-22 NOTE — Telephone Encounter (Signed)
Patient returned Beth Frey's call and she is aware of her lab results.

## 2023-04-16 DIAGNOSIS — Z0100 Encounter for examination of eyes and vision without abnormal findings: Secondary | ICD-10-CM | POA: Diagnosis not present

## 2023-05-19 ENCOUNTER — Other Ambulatory Visit: Payer: Self-pay | Admitting: Family Medicine

## 2023-05-22 ENCOUNTER — Other Ambulatory Visit (HOSPITAL_BASED_OUTPATIENT_CLINIC_OR_DEPARTMENT_OTHER): Payer: Self-pay | Admitting: Family Medicine

## 2023-05-22 DIAGNOSIS — Z1231 Encounter for screening mammogram for malignant neoplasm of breast: Secondary | ICD-10-CM

## 2023-05-27 ENCOUNTER — Other Ambulatory Visit: Payer: Self-pay | Admitting: Family Medicine

## 2023-06-13 ENCOUNTER — Ambulatory Visit (HOSPITAL_BASED_OUTPATIENT_CLINIC_OR_DEPARTMENT_OTHER)
Admission: RE | Admit: 2023-06-13 | Discharge: 2023-06-13 | Disposition: A | Discharge status: Home / Self Care | Payer: Medicare HMO | Source: Ambulatory Visit | Attending: Family Medicine | Admitting: Family Medicine

## 2023-06-13 ENCOUNTER — Encounter (HOSPITAL_BASED_OUTPATIENT_CLINIC_OR_DEPARTMENT_OTHER): Payer: Self-pay | Admitting: Radiology

## 2023-06-13 DIAGNOSIS — Z1231 Encounter for screening mammogram for malignant neoplasm of breast: Secondary | ICD-10-CM | POA: Insufficient documentation

## 2023-06-16 ENCOUNTER — Other Ambulatory Visit: Payer: Self-pay | Admitting: Family Medicine

## 2023-06-16 DIAGNOSIS — F419 Anxiety disorder, unspecified: Secondary | ICD-10-CM

## 2023-06-18 NOTE — Telephone Encounter (Signed)
 Requested Prescriptions   Pending Prescriptions Disp Refills   LORazepam  (ATIVAN ) 2 MG tablet [Pharmacy Med Name: LORAZEPAM  2 MG TABLET] 30 tablet 3    Sig: Take 1 tablet (2 mg total) by mouth at bedtime as needed for anxiety or sleep. STOP TEMAZEPAM      Date of patient request: 06/18/2023 Last office visit: 02/21/2023 Upcoming visit: 08/15/2023 Date of last refill: 02/01/2024 Last refill amount: 30x3

## 2023-08-08 ENCOUNTER — Encounter: Payer: Self-pay | Admitting: Family Medicine

## 2023-08-13 DIAGNOSIS — S82125A Nondisplaced fracture of lateral condyle of left tibia, initial encounter for closed fracture: Secondary | ICD-10-CM | POA: Diagnosis not present

## 2023-08-15 ENCOUNTER — Encounter: Payer: Medicare HMO | Admitting: Family Medicine

## 2023-08-22 ENCOUNTER — Encounter: Payer: Medicare HMO | Admitting: Family Medicine

## 2023-08-25 ENCOUNTER — Other Ambulatory Visit: Payer: Self-pay | Admitting: Family Medicine

## 2023-09-12 DIAGNOSIS — H2513 Age-related nuclear cataract, bilateral: Secondary | ICD-10-CM | POA: Diagnosis not present

## 2023-10-09 ENCOUNTER — Telehealth: Payer: Self-pay

## 2023-10-09 NOTE — Telephone Encounter (Signed)
 Called Beth Frey  and she states she has no idea why [name] is calling our office requesting this. We are not to talk to them per pt request. Pt states she will talk to Dr.Tabori about her advise on bone density and other decisions next week at her appt.

## 2023-10-09 NOTE — Telephone Encounter (Signed)
 Copied from CRM [id number]. Topic: General - Other >> Oct 09, 2023  9:10 AM Dorisann Garre T wrote: Reason for CRM: patient is needing a bone density test done for a fracture to her left tibial Beth Frey from [name] is requesting this cb number [phone]

## 2023-10-09 NOTE — Telephone Encounter (Signed)
 Called Ericka w/[facility] left vm to call back.

## 2023-10-12 DIAGNOSIS — Z0100 Encounter for examination of eyes and vision without abnormal findings: Secondary | ICD-10-CM | POA: Diagnosis not present

## 2023-10-15 ENCOUNTER — Encounter: Payer: Self-pay | Admitting: Family Medicine

## 2023-10-15 ENCOUNTER — Ambulatory Visit (INDEPENDENT_AMBULATORY_CARE_PROVIDER_SITE_OTHER): Admitting: Family Medicine

## 2023-10-15 VITALS — BP 124/70 | HR 83 | Temp 97.9°F | Ht 66.5 in | Wt 142.0 lb

## 2023-10-15 DIAGNOSIS — F419 Anxiety disorder, unspecified: Secondary | ICD-10-CM | POA: Diagnosis not present

## 2023-10-15 DIAGNOSIS — E782 Mixed hyperlipidemia: Secondary | ICD-10-CM | POA: Diagnosis not present

## 2023-10-15 DIAGNOSIS — Z0000 Encounter for general adult medical examination without abnormal findings: Secondary | ICD-10-CM | POA: Diagnosis not present

## 2023-10-15 DIAGNOSIS — E559 Vitamin D deficiency, unspecified: Secondary | ICD-10-CM

## 2023-10-15 LAB — BASIC METABOLIC PANEL WITH GFR
BUN: 12 mg/dL (ref 6–23)
CO2: 25 meq/L (ref 19–32)
Calcium: 10.5 mg/dL (ref 8.4–10.5)
Chloride: 101 meq/L (ref 96–112)
Creatinine, Ser: 0.76 mg/dL (ref 0.40–1.20)
GFR: 81.28 mL/min (ref >60.00)
Glucose, Bld: 117 mg/dL — ABNORMAL HIGH (ref 70–99)
Potassium: 4.3 meq/L (ref 3.5–5.1)
Sodium: 138 meq/L (ref 135–145)

## 2023-10-15 LAB — LIPID PANEL
Cholesterol: 219 mg/dL — ABNORMAL HIGH (ref 0–200)
HDL: 94.4 mg/dL (ref >39.00)
LDL Cholesterol: 112 mg/dL — ABNORMAL HIGH (ref 0–99)
NonHDL: 124.26
Total CHOL/HDL Ratio: 2
Triglycerides: 62 mg/dL (ref 0.0–149.0)
VLDL: 12.4 mg/dL (ref 0.0–40.0)

## 2023-10-15 LAB — CBC WITH DIFFERENTIAL/PLATELET
Basophils Absolute: 0 10*3/uL (ref 0.0–0.1)
Basophils Relative: 0.4 % (ref 0.0–3.0)
Eosinophils Absolute: 0.2 10*3/uL (ref 0.0–0.7)
Eosinophils Relative: 2.4 % (ref 0.0–5.0)
HCT: 43.5 % (ref 36.0–46.0)
Hemoglobin: 14.8 g/dL (ref 12.0–15.0)
Lymphocytes Relative: 38.6 % (ref 12.0–46.0)
Lymphs Abs: 3.7 10*3/uL (ref 0.7–4.0)
MCHC: 34 g/dL (ref 30.0–36.0)
MCV: 89.8 fl (ref 78.0–100.0)
Monocytes Absolute: 0.8 10*3/uL (ref 0.1–1.0)
Monocytes Relative: 8.3 % (ref 3.0–12.0)
Neutro Abs: 4.8 10*3/uL (ref 1.4–7.7)
Neutrophils Relative %: 50.3 % (ref 43.0–77.0)
Platelets: 265 10*3/uL (ref 150.0–400.0)
RBC: 4.85 Mil/uL (ref 3.87–5.11)
RDW: 13.5 % (ref 11.5–15.5)
WBC: 9.5 10*3/uL (ref 4.0–10.5)

## 2023-10-15 LAB — HEPATIC FUNCTION PANEL
ALT: 34 U/L (ref 0–35)
AST: 30 U/L (ref 0–37)
Albumin: 5 g/dL (ref 3.5–5.2)
Alkaline Phosphatase: 47 U/L (ref 39–117)
Bilirubin, Direct: 0.2 mg/dL (ref 0.0–0.3)
Total Bilirubin: 0.7 mg/dL (ref 0.2–1.2)
Total Protein: 7.9 g/dL (ref 6.0–8.3)

## 2023-10-15 MED ORDER — LORAZEPAM 2 MG PO TABS
2.0000 mg | ORAL_TABLET | Freq: Every evening | ORAL | 3 refills | Status: AC | PRN
Start: 2023-10-15 — End: ?

## 2023-10-15 NOTE — Progress Notes (Signed)
   Subjective:    Patient ID: Beth Frey, female    DOB: February 08, 1956, 68 y.o.   MRN: [id number]  HPI CPE- declines Tdap, shingles, PNA.  UTD on mammo, colonoscopy  Patient Care Team    Relationship Specialty Notifications Start End  Jess Morita, MD PCP - General Family Medicine  10/19/20   Lanita Pitman, MD  Gastroenterology  11/23/11   Dohmeier, Raoul Byes, MD  Neurology  11/23/11   Arvil Birks, MD  Orthopedic Surgery  11/23/11   Williford, Galen Judge, MD Referring Physician Dermatology  10/19/20   Cherilynn Cornea, MD  General Practice  10/19/20   Channing Commander, MD Consulting Physician Optometry  02/21/23      Health Maintenance  Topic Date Due   Zoster Vaccines- Shingrix (1 of 2) Never done   Pneumonia Vaccine 71+ Years old (1 of 1 - PCV) Never done   Medicare Annual Wellness (AWV)  10/04/2023   DTaP/Tdap/Td (2 - Td or Tdap) 10/21/2023   INFLUENZA VACCINE  12/07/2023   MAMMOGRAM  06/12/2025   Colonoscopy  06/30/2029   DEXA SCAN  Completed   Hepatitis C Screening  Completed   HPV VACCINES  Aged Out   Meningococcal B Vaccine  Aged Out   COVID-19 Vaccine  Discontinued      Review of Systems Patient reports no vision/ hearing changes, adenopathy,fever, weight change,  persistant/recurrent hoarseness , swallowing issues, chest pain, palpitations, edema, persistant/recurrent cough, hemoptysis, dyspnea (rest/exertional/paroxysmal nocturnal), gastrointestinal bleeding (melena, rectal bleeding), abdominal pain, significant heartburn, bowel changes, GU symptoms (dysuria, hematuria, incontinence), Gyn symptoms (abnormal  bleeding, pain),  syncope, focal weakness, memory loss, numbness & tingling, skin/hair/nail changes, abnormal bruising or bleeding, anxiety, or depression.     Objective:   Physical Exam General Appearance:    Alert, cooperative, no distress, appears stated age  Head:    Normocephalic, without obvious abnormality, atraumatic  Eyes:    PERRL, conjunctiva/corneas clear,  EOM's intact both eyes  Ears:    Normal TM's and external ear canals, both ears  Nose:   Nares normal, septum midline, mucosa normal, no drainage    or sinus tenderness  Throat:   Lips, mucosa, and tongue normal; teeth and gums normal  Neck:   Supple, symmetrical, trachea midline, no adenopathy;    Thyroid : no enlargement/tenderness/nodules  Back:     Symmetric, no curvature, ROM normal, no CVA tenderness  Lungs:     Clear to auscultation bilaterally, respirations unlabored  Chest Wall:    No tenderness or deformity   Heart:    Regular rate and rhythm, S1 and S2 normal, no murmur, rub   or gallop  Breast Exam:    Deferred to mammo  Abdomen:     Soft, non-tender, bowel sounds active all four quadrants,    no masses, no organomegaly  Genitalia:    Deferred   Rectal:    Extremities:   Extremities normal, atraumatic, no cyanosis or edema  Pulses:   2+ and symmetric all extremities  Skin:   Skin color, texture, turgor normal, no rashes or lesions  Lymph nodes:   Cervical, supraclavicular, and axillary nodes normal  Neurologic:   CNII-XII intact, normal strength, sensation and reflexes    throughout          Assessment & Plan:

## 2023-10-15 NOTE — Assessment & Plan Note (Signed)
 Pt's PE WNL.  UTD on mammo, colonoscopy.  Declines vaccines.  Check labs.  Anticipatory guidance provided.

## 2023-10-15 NOTE — Patient Instructions (Signed)
Follow up in 6 months to recheck cholesterol We'll notify you of your lab results and make any changes if needed Keep up the good work on healthy diet and regular exercise- you look great! Call with any questions or concerns Stay Safe!  Stay Healthy! Have a great summer!!! 

## 2023-10-18 ENCOUNTER — Encounter: Payer: Self-pay | Admitting: Family Medicine

## 2023-10-18 LAB — TSH: TSH: 1.5 u[IU]/mL (ref 0.35–5.50)

## 2023-10-18 LAB — VITAMIN D 25 HYDROXY (VIT D DEFICIENCY, FRACTURES): VITD: 63.12 ng/mL (ref 30.00–100.00)

## 2023-10-19 ENCOUNTER — Ambulatory Visit: Payer: Self-pay | Admitting: Family Medicine

## 2023-10-22 ENCOUNTER — Other Ambulatory Visit: Payer: Self-pay | Admitting: Family Medicine

## 2023-10-22 DIAGNOSIS — F419 Anxiety disorder, unspecified: Secondary | ICD-10-CM

## 2023-10-22 NOTE — Progress Notes (Signed)
 Pt has reviewed labs via MyChart

## 2023-10-23 ENCOUNTER — Encounter: Payer: Self-pay | Admitting: Family Medicine

## 2023-10-23 DIAGNOSIS — F419 Anxiety disorder, unspecified: Secondary | ICD-10-CM

## 2023-10-23 MED ORDER — LORAZEPAM 2 MG PO TABS
2.0000 mg | ORAL_TABLET | Freq: Every evening | ORAL | 3 refills | Status: DC | PRN
Start: 2023-10-23 — End: 2023-12-31

## 2023-10-23 MED ORDER — FLUCONAZOLE 150 MG PO TABS: 150.0000 mg | ORAL_TABLET | Freq: Once | ORAL | 0 refills | Status: AC

## 2023-10-23 NOTE — Addendum Note (Signed)
 Addended by: Vivian Okelley E on: 10/23/2023 03:02 PM   Modules accepted: Orders

## 2023-10-23 NOTE — Telephone Encounter (Unsigned)
 Copied from CRM [id number]. Topic: Clinical - Prescription Issue >> Oct 23, 2023 11:44 AM Beth Frey wrote: Reason for CRM: Patient called to advised that pharmacy said they did not receive her prescription for LORazepam  (ATIVAN ) 2 MG tablet [[id number]] and if it can be resent to them.

## 2023-10-29 ENCOUNTER — Telehealth: Payer: Self-pay

## 2023-10-29 NOTE — Telephone Encounter (Signed)
 Copied from CRM [id number]. Topic: Clinical - Medication Question >> Oct 29, 2023  3:53 PM Henretta I wrote: Reason for CRM: Patient already has 3 refills left for LORazepam  (ATIVAN ) 2 MG but [facility] is not seeing it on file they are only seeing 1 MG. Patient was wondering if a new refill request can be sent.

## 2023-10-29 NOTE — Telephone Encounter (Signed)
 Called pharmacy and confirmed that they have the 2 mg dose now

## 2023-11-02 ENCOUNTER — Other Ambulatory Visit: Payer: Self-pay | Admitting: Family Medicine

## 2023-11-06 ENCOUNTER — Encounter

## 2023-11-17 ENCOUNTER — Other Ambulatory Visit: Payer: Self-pay | Admitting: Family Medicine

## 2023-11-20 ENCOUNTER — Ambulatory Visit (INDEPENDENT_AMBULATORY_CARE_PROVIDER_SITE_OTHER): Admitting: *Deleted

## 2023-11-20 VITALS — Ht 66.5 in | Wt 142.0 lb

## 2023-11-20 DIAGNOSIS — Z0000 Encounter for general adult medical examination without abnormal findings: Secondary | ICD-10-CM

## 2023-11-20 DIAGNOSIS — Z780 Asymptomatic menopausal state: Secondary | ICD-10-CM | POA: Diagnosis not present

## 2023-11-20 NOTE — Progress Notes (Signed)
 Subjective:   Beth Frey is a 68 y.o. female who presents for Medicare Annual (Subsequent) preventive examination.  Visit Complete: Virtual I connected with  Beth Frey on 11/20/23 by a audio enabled telemedicine application and verified that I am speaking with the correct person using two identifiers.  Patient Location: Home  Provider Location: Home Office  I discussed the limitations of evaluation and management by telemedicine. The patient expressed understanding and agreed to proceed.  Vital Signs: Because this visit was a virtual/telehealth visit, some criteria may be missing or patient reported. Any vitals not documented were not able to be obtained and vitals that have been documented are patient reported.   Cardiac Risk Factors include: advanced age (>39men, >62 women)     Objective:    Today's Vitals   11/20/23 1316  Weight: 142 lb (64.4 kg)  Height: 5' 6.5 (1.689 m)   Body mass index is 22.58 kg/m.     11/20/2023    1:15 PM 12/07/2022    4:52 AM 10/04/2022    8:04 AM 01/24/2022    9:16 AM 02/19/2020    7:38 PM 11/04/2019    6:00 PM 11/04/2019    4:57 PM  Advanced Directives  Does Patient Have a Medical Advance Directive? No Yes Yes Yes No Yes Yes  Type of Astronomer will  Healthcare Power of [place];Living will Healthcare Power of [place];Living will  Does patient want to make changes to medical advance directive?  No - Patient declined No - Patient declined No - Patient declined  No - Patient declined   Copy of Healthcare Power of Attorney in Chart?      No - copy requested No - copy requested  Would patient like information on creating a medical advance directive? No - Patient declined No - Patient declined         Current Medications (verified) Outpatient Encounter Medications as of 11/20/2023  Medication Sig   celecoxib  (CELEBREX ) 200 MG capsule TAKE 1 CAPSULE BY MOUTH EVERY DAY   EPINEPHrine  0.3 mg/0.3 mL IJ  SOAJ injection Inject 0.3 mg into the muscle as needed.   LORazepam  (ATIVAN ) 2 MG tablet Take 1 tablet (2 mg total) by mouth at bedtime as needed for anxiety or sleep. STOP TEMAZEPAM    Multiple Vitamin (MULTIVITAMIN WITH MINERALS) TABS tablet Take 1 tablet by mouth daily.   Nutritional Supplements (JUICE PLUS FIBRE PO) Take 1 tablet by mouth daily.   potassium chloride  (MICRO-K ) 10 MEQ CR capsule TAKE 1 CAPSULE BY MOUTH THREE TIMES A DAY   rosuvastatin  (CRESTOR ) 10 MG tablet TAKE 1 TABLET BY MOUTH EVERY DAY   sertraline  (ZOLOFT ) 50 MG tablet Take 50 mg by mouth daily.   No facility-administered encounter medications on file as of 11/20/2023.    Allergies (verified) Fish allergy, Sulfa antibiotics, Doxycycline , Remeron [mirtazapine], Trazodone  and nefazodone, and Vancomycin    History: Past Medical History:  Diagnosis Date   Anxiety    Back pain, chronic    Benign essential HTN 11/28/2019   Cancer (HCC)    skin   Cecal bascule (HCC)    Osteoarthritis    Past Surgical History:  Procedure Laterality Date   AUGMENTATION MAMMAPLASTY     Pt has had explants   BREAST SURGERY  August 2011   Implants removed   COLON SURGERY  02/20/2010   Right hemicolectomy-cecal bascule   ELBOW SURGERY     left elbow   JOINT REPLACEMENT  2006  Left total knee replacement   OVARIAN CYST REMOVAL     right   SKIN CANCER EXCISION     multiple-SCC and BCC   TUBAL LIGATION     Uterine ablation     WRIST SURGERY  2009   right wrist   Family History  Problem Relation Age of Onset   Cancer Mother        multiple myeloma   Parkinson's disease Mother    Cancer Father        stomach cancer   Cancer Brother        pancreatic cancer   Stroke Maternal Grandmother    Cancer Maternal Grandfather        lung cancer   Cancer Paternal Grandmother        ovarian   Heart disease Paternal Grandfather    Cancer Brother        multiple myeloma   Social History   Socioeconomic History   Marital status:  Married    Spouse name: Not on file   Number of children: Not on file   Years of education: Not on file   Highest education level: Not on file  Occupational History   Not on file  Tobacco Use   Smoking status: Never   Smokeless tobacco: Never  Vaping Use   Vaping status: Never Used  Substance and Sexual Activity   Alcohol use: No    Alcohol/week: 0.0 standard drinks of alcohol   Drug use: No   Sexual activity: Not Currently    Comment: married  Other Topics Concern   Not on file  Social History Narrative   Not on file   Social Drivers of Health   Financial Resource Strain: Low Risk  (11/20/2023)   Overall Financial Resource Strain (CARDIA)    Difficulty of Paying Living Expenses: Not hard at all  Food Insecurity: No Food Insecurity (11/20/2023)   Hunger Vital Sign    Worried About Running Out of Food in the Last Year: Never true    Ran Out of Food in the Last Year: Never true  Transportation Needs: No Transportation Needs (11/20/2023)   PRAPARE - Administrator, Civil Service (Medical): No    Lack of Transportation (Non-Medical): No  Physical Activity: Sufficiently Active (11/20/2023)   Exercise Vital Sign    Days of Exercise per Week: 4 days    Minutes of Exercise per Session: 40 min  Stress: No Stress Concern Present (11/20/2023)   [employer] of Occupational Health - Occupational Stress Questionnaire    Feeling of Stress: Not at all  Social Connections: Unknown (11/20/2023)   Social Connection and Isolation Panel    Frequency of Communication with Friends and Family: More than three times a week    Frequency of Social Gatherings with Friends and Family: Three times a week    Attends Religious Services: More than 4 times per year    Active Member of Clubs or Organizations: Yes    Attends Engineer, structural: More than 4 times per year    Marital Status: Not on file    Tobacco Counseling Counseling given: Not Answered   Clinical  Intake:  Pre-visit preparation completed: Yes  Pain : No/denies pain     Diabetes: No  How often do you need to have someone help you when you read instructions, pamphlets, or other written materials from your doctor or pharmacy?: 1 - Never  Interpreter Needed?: No  Information entered by :: Beth  Andrewjames Weirauch LPN   Activities of Daily Living    11/20/2023    1:16 PM  In your present state of health, do you have any difficulty performing the following activities:  Hearing? 0  Vision? 0  Difficulty concentrating or making decisions? 0  Walking or climbing stairs? 0  Dressing or bathing? 0  Doing errands, shopping? 0  Preparing Food and eating ? N  Using the Toilet? N  In the past six months, have you accidently leaked urine? N  Do you have problems with loss of bowel control? N  Managing your Medications? N  Managing your Finances? N  Housekeeping or managing your Housekeeping? N    Patient Care Team: Mahlon Comer BRAVO, MD as PCP - General (Family Medicine) Donnald Charleston, MD (Gastroenterology) Dohmeier, Dedra, MD (Neurology) Shari Easter, MD (Orthopedic Surgery) Williford, Manus HERO, MD as Referring Physician (Dermatology) Denney Anes, MD (General Practice) Nonah Camellia ORN, MD as Consulting Physician (Optometry)  Indicate any recent Medical Services you may have received from other than [facility] providers in the past year (date may be approximate).     Assessment:   This is a routine wellness examination for Lori-Ann.  Hearing/Vision screen Hearing Screening - Comments:: No trouble hearing Vision Screening - Comments:: Up to date [facility]   Goals Addressed             This Visit's Progress    Patient Stated   On track    Maintain current lifestyle     Patient Stated       Continue current lifestyle       Depression Screen    11/20/2023    1:19 PM 10/15/2023    8:15 AM 02/21/2023    8:31 AM 10/04/2022    8:09 AM 08/24/2022    9:15 AM 07/13/2022     8:49 AM 01/24/2022    9:17 AM  PHQ 2/9 Scores  PHQ - 2 Score 0 0 0 0 0 0 0  PHQ- 9 Score 1 1 1 2 3 6      Fall Risk    11/20/2023    1:13 PM 02/21/2023    8:31 AM 10/04/2022    8:03 AM 08/24/2022    9:15 AM 07/13/2022    8:49 AM  Fall Risk   Falls in the past year? 0 0 0 0 0  Number falls in past yr: 0 0 0 0 0  Injury with Fall? 0 0 0 0 0  Risk for fall due to :  No Fall Risks  No Fall Risks No Fall Risks  Follow up Falls evaluation completed;Education provided;Falls prevention discussed Falls evaluation completed Falls evaluation completed;Education provided;Falls prevention discussed Falls evaluation completed Falls evaluation completed    MEDICARE RISK AT HOME: Medicare Risk at Home Any stairs in or around the home?: Yes If so, are there any without handrails?: No Home free of loose throw rugs in walkways, pet beds, electrical cords, etc?: Yes Adequate lighting in your home to reduce risk of falls?: Yes Life alert?: No Use of a cane, walker or w/c?: No Grab bars in the bathroom?: No Shower chair or bench in shower?: Yes Elevated toilet seat or a handicapped toilet?: Yes  TIMED UP AND GO:  Was the test performed?  No    Cognitive Function:        11/20/2023    1:39 PM 10/04/2022    8:06 AM  6CIT Screen  What Year? 0 points 0 points  What month? 0  points 0 points  What time? 0 points 0 points  Count back from 20 0 points 0 points  Months in reverse 0 points 0 points  Repeat phrase 0 points 0 points  Total Score 0 points 0 points    Immunizations Immunization History  Administered Date(s) Administered   PFIZER(Purple Top)SARS-COV-2 Vaccination 01/08/2020, 02/08/2020   Tdap 10/20/2013    TDAP status: Up to date  Flu Vaccine status: Up to date  Pneumococcal vaccine status: Declined,  Education has been provided regarding the importance of this vaccine but patient still declined. Advised may receive this vaccine at local pharmacy or Health Dept. Aware to provide  a copy of the vaccination record if obtained from local pharmacy or Health Dept. Verbalized acceptance and understanding.   Covid-19 vaccine status: Information provided on how to obtain vaccines.   Qualifies for Shingles Vaccine? Yes   Zostavax completed No   Shingrix Completed?: No.    Education has been provided regarding the importance of this vaccine. Patient has been advised to call insurance company to determine out of pocket expense if they have not yet received this vaccine. Advised may also receive vaccine at local pharmacy or Health Dept. Verbalized acceptance and understanding.  Screening Tests Health Maintenance  Topic Date Due   Zoster Vaccines- Shingrix (1 of 2) Never done   Pneumococcal Vaccine: 50+ Years (1 of 1 - PCV) Never done   DTaP/Tdap/Td (2 - Td or Tdap) 10/21/2023   INFLUENZA VACCINE  12/07/2023   Medicare Annual Wellness (AWV)  11/19/2024   MAMMOGRAM  06/12/2025   Colonoscopy  06/30/2029   DEXA SCAN  Completed   Hepatitis C Screening  Completed   Hepatitis B Vaccines  Aged Out   HPV VACCINES  Aged Out   Meningococcal B Vaccine  Aged Out   COVID-19 Vaccine  Discontinued    Health Maintenance  Health Maintenance Due  Topic Date Due   Zoster Vaccines- Shingrix (1 of 2) Never done   Pneumococcal Vaccine: 50+ Years (1 of 1 - PCV) Never done   DTaP/Tdap/Td (2 - Td or Tdap) 10/21/2023    Colorectal cancer screening: Type of screening: Colonoscopy. Completed 2021. Repeat every 10 years  Mammogram status: Completed  . Repeat every year  Bone Density status: Ordered  . Pt provided with contact info and advised to call to schedule appt.  Lung Cancer Screening: (Low Dose CT Chest recommended if Age 44-80 years, 20 pack-year currently smoking OR have quit w/in 15years.) does not qualify.   Lung Cancer Screening Referral:   Additional Screening:  Hepatitis C Screening  2017 completed  Vision Screening: Recommended annual ophthalmology exams for early  detection of glaucoma and other disorders of the eye. Is the patient up to date with their annual eye exam?  Yes  Who is the provider or what is the name of the office in which the patient attends annual eye exams? [facility] If pt is not established with a provider, would they like to be referred to a provider to establish care? No .   Dental Screening: Recommended annual dental exams for proper oral hygiene    Community Resource Referral / Chronic Care Management: CRR required this visit?  No   CCM required this visit?  No     Plan:     I have personally reviewed and noted the following in the patient's chart:   Medical and social history Use of alcohol, tobacco or illicit drugs  Current medications and supplements including  opioid prescriptions. Patient is not currently taking opioid prescriptions. Functional ability and status Nutritional status Physical activity Advanced directives List of other physicians Hospitalizations, surgeries, and ER visits in previous 12 months Vitals Screenings to include cognitive, depression, and falls Referrals and appointments  In addition, I have reviewed and discussed with patient certain preventive protocols, quality metrics, and best practice recommendations. A written personalized care plan for preventive services as well as general preventive health recommendations were provided to patient.     Beth Graff, LPN   2/84/7974   After Visit Summary: (MyChart) Due to this being a telephonic visit, the after visit summary with patients personalized plan was offered to patient via MyChart   Nurse Notes:

## 2023-11-20 NOTE — Patient Instructions (Signed)
 Ms. Baney , Thank you for taking time to come for your Medicare Wellness Visit. I appreciate your ongoing commitment to your health goals. Please review the following plan we discussed and let me know if I can assist you in the future.   Screening recommendations/referrals: Colonoscopy: up to date Mammogram: up to date Bone Density: Education provided Recommended yearly ophthalmology/optometry visit for glaucoma screening and checkup Recommended yearly dental visit for hygiene and checkup  Vaccinations: Influenza vaccine: up to date Pneumococcal vaccine:  Tdap vaccine: Education provided Shingles vaccine: Education provided      Preventive Care 65 Years and Older, Female Preventive care refers to lifestyle choices and visits with your health care provider that can promote health and wellness. What does preventive care include? A yearly physical exam. This is also called an annual well check. Dental exams once or twice a year. Routine eye exams. Ask your health care provider how often you should have your eyes checked. Personal lifestyle choices, including: Daily care of your teeth and gums. Regular physical activity. Eating a healthy diet. Avoiding tobacco and drug use. Limiting alcohol use. Practicing safe sex. Taking low-dose aspirin  every day. Taking vitamin and mineral supplements as recommended by your health care provider. What happens during an annual well check? The services and screenings done by your health care provider during your annual well check will depend on your age, overall health, lifestyle risk factors, and family history of disease. Counseling  Your health care provider may ask you questions about your: Alcohol use. Tobacco use. Drug use. Emotional well-being. Home and relationship well-being. Sexual activity. Eating habits. History of falls. Memory and ability to understand (cognition). Work and work Astronomer. Reproductive health. Screening   You may have the following tests or measurements: Height, weight, and BMI. Blood pressure. Lipid and cholesterol levels. These may be checked every 5 years, or more frequently if you are over 60 years old. Skin check. Lung cancer screening. You may have this screening every year starting at age 80 if you have a 30-pack-year history of smoking and currently smoke or have quit within the past 15 years. Fecal occult blood test (FOBT) of the stool. You may have this test every year starting at age 65. Flexible sigmoidoscopy or colonoscopy. You may have a sigmoidoscopy every 5 years or a colonoscopy every 10 years starting at age 65. Hepatitis C blood test. Hepatitis B blood test. Sexually transmitted disease (STD) testing. Diabetes screening. This is done by checking your blood sugar (glucose) after you have not eaten for a while (fasting). You may have this done every 1-3 years. Bone density scan. This is done to screen for osteoporosis. You may have this done starting at age 18. Mammogram. This may be done every 1-2 years. Talk to your health care provider about how often you should have regular mammograms. Talk with your health care provider about your test results, treatment options, and if necessary, the need for more tests. Vaccines  Your health care provider may recommend certain vaccines, such as: Influenza vaccine. This is recommended every year. Tetanus, diphtheria, and acellular pertussis (Tdap, Td) vaccine. You may need a Td booster every 10 years. Zoster vaccine. You may need this after age 46. Pneumococcal 13-valent conjugate (PCV13) vaccine. One dose is recommended after age 39. Pneumococcal polysaccharide (PPSV23) vaccine. One dose is recommended after age 50. Talk to your health care provider about which screenings and vaccines you need and how often you need them. This information is not intended to  replace advice given to you by your health care provider. Make sure you discuss  any questions you have with your health care provider. Document Released: 05/21/2015 Document Revised: 01/12/2016 Document Reviewed: 02/23/2015 Elsevier Interactive Patient Education  2017 [employer].  Fall Prevention in the Home Falls can cause injuries. They can happen to people of all ages. There are many things you can do to make your home safe and to help prevent falls. What can I do on the outside of my home? Regularly fix the edges of walkways and driveways and fix any cracks. Remove anything that might make you trip as you walk through a door, such as a raised step or threshold. Trim any bushes or trees on the path to your home. Use bright outdoor lighting. Clear any walking paths of anything that might make someone trip, such as rocks or tools. Regularly check to see if handrails are loose or broken. Make sure that both sides of any steps have handrails. Any raised decks and porches should have guardrails on the edges. Have any leaves, snow, or ice cleared regularly. Use sand or salt on walking paths during winter. Clean up any spills in your garage right away. This includes oil or grease spills. What can I do in the bathroom? Use night lights. Install grab bars by the toilet and in the tub and shower. Do not use towel bars as grab bars. Use non-skid mats or decals in the tub or shower. If you need to sit down in the shower, use a plastic, non-slip stool. Keep the floor dry. Clean up any water that spills on the floor as soon as it happens. Remove soap buildup in the tub or shower regularly. Attach bath mats securely with double-sided non-slip rug tape. Do not have throw rugs and other things on the floor that can make you trip. What can I do in the bedroom? Use night lights. Make sure that you have a light by your bed that is easy to reach. Do not use any sheets or blankets that are too big for your bed. They should not hang down onto the floor. Have a firm chair that has  side arms. You can use this for support while you get dressed. Do not have throw rugs and other things on the floor that can make you trip. What can I do in the kitchen? Clean up any spills right away. Avoid walking on wet floors. Keep items that you use a lot in easy-to-reach places. If you need to reach something above you, use a strong step stool that has a grab bar. Keep electrical cords out of the way. Do not use floor polish or wax that makes floors slippery. If you must use wax, use non-skid floor wax. Do not have throw rugs and other things on the floor that can make you trip. What can I do with my stairs? Do not leave any items on the stairs. Make sure that there are handrails on both sides of the stairs and use them. Fix handrails that are broken or loose. Make sure that handrails are as long as the stairways. Check any carpeting to make sure that it is firmly attached to the stairs. Fix any carpet that is loose or worn. Avoid having throw rugs at the top or bottom of the stairs. If you do have throw rugs, attach them to the floor with carpet tape. Make sure that you have a light switch at the top of the stairs and the  bottom of the stairs. If you do not have them, ask someone to add them for you. What else can I do to help prevent falls? Wear shoes that: Do not have high heels. Have rubber bottoms. Are comfortable and fit you well. Are closed at the toe. Do not wear sandals. If you use a stepladder: Make sure that it is fully opened. Do not climb a closed stepladder. Make sure that both sides of the stepladder are locked into place. Ask someone to hold it for you, if possible. Clearly mark and make sure that you can see: Any grab bars or handrails. First and last steps. Where the edge of each step is. Use tools that help you move around (mobility aids) if they are needed. These include: Canes. Walkers. Scooters. Crutches. Turn on the lights when you go into a dark area.  Replace any light bulbs as soon as they burn out. Set up your furniture so you have a clear path. Avoid moving your furniture around. If any of your floors are uneven, fix them. If there are any pets around you, be aware of where they are. Review your medicines with your doctor. Some medicines can make you feel dizzy. This can increase your chance of falling. Ask your doctor what other things that you can do to help prevent falls. This information is not intended to replace advice given to you by your health care provider. Make sure you discuss any questions you have with your health care provider. Document Released: 02/18/2009 Document Revised: 09/30/2015 Document Reviewed: 05/29/2014 Elsevier Interactive Patient Education  2017 [employer].

## 2023-11-21 ENCOUNTER — Other Ambulatory Visit: Payer: Self-pay | Admitting: Family Medicine

## 2023-11-21 MED ORDER — CELECOXIB 200 MG PO CAPS: 200.0000 mg | ORAL_CAPSULE | Freq: Every day | ORAL | 1 refills | Status: DC

## 2023-11-21 NOTE — Telephone Encounter (Signed)
 Copied from CRM [id number]. Topic: Clinical - Medication Refill >> Nov 21, 2023 11:32 AM Macario HERO wrote: Medication: celecoxib  (CELEBREX ) 200 MG capsule [[id number]]  Has the patient contacted their pharmacy? Yes (Agent: If no, request that the patient contact the pharmacy for the refill. If patient does not wish to contact the pharmacy document the reason why and proceed with request.) (Agent: If yes, when and what did the pharmacy advise?)  This is the patient's preferred pharmacy:  [id number] [place], [place] - [address] [place] [address] Phone: [phone] Fax: [phone]   Is this the correct pharmacy for this prescription? Yes If no, delete pharmacy and type the correct one.   Has the prescription been filled recently? Yes  Is the patient out of the medication? No, leaving in a week  Has the patient been seen for an appointment in the last year OR does the patient have an upcoming appointment? Yes  Can we respond through MyChart? Yes  Agent: Please be advised that Rx refills may take up to 3 business days. We ask that you follow-up with your pharmacy.

## 2023-12-15 DIAGNOSIS — Z13820 Encounter for screening for osteoporosis: Secondary | ICD-10-CM | POA: Diagnosis not present

## 2023-12-19 ENCOUNTER — Encounter: Payer: Self-pay | Admitting: Family Medicine

## 2023-12-24 ENCOUNTER — Ambulatory Visit (INDEPENDENT_AMBULATORY_CARE_PROVIDER_SITE_OTHER): Admitting: Family Medicine

## 2023-12-24 ENCOUNTER — Encounter: Payer: Self-pay | Admitting: Family Medicine

## 2023-12-24 VITALS — BP 124/70 | HR 86 | Temp 98.0°F | Ht 66.5 in | Wt 140.2 lb

## 2023-12-24 DIAGNOSIS — F419 Anxiety disorder, unspecified: Secondary | ICD-10-CM | POA: Diagnosis not present

## 2023-12-24 DIAGNOSIS — R6884 Jaw pain: Secondary | ICD-10-CM | POA: Diagnosis not present

## 2023-12-24 MED ORDER — FLURBIPROFEN 100 MG PO TABS: 100.0000 mg | ORAL_TABLET | Freq: Two times a day (BID) | ORAL | 0 refills | Status: AC

## 2023-12-24 NOTE — Assessment & Plan Note (Signed)
 Deteriorated.  Suspect this is the underlying cause of her insomnia.  She agrees.  Discussed increasing Sertraline  to 100mg  daily.  She reports she has this dose available at home and is willing to go back up to see if sleep improves.  She has previously been on Trazodone  but had side effects.  We can increase the Ativan  but need to be worried about tolerance.  Could consider something like Sonata, Lunesta, or Rozerem in the future.  Pt expressed understanding and is in agreement w/ plan.

## 2023-12-24 NOTE — Progress Notes (Signed)
   Subjective:    Patient ID: Beth Frey, female    DOB: 10-21-1955, 68 y.o.   MRN: [id number]  HPI Insomnia- pt reports sxs have been ongoing for months.  Will take Ativan  but this only allows for a few hours of sleep.  Will take Benadryl  which only provides 1 additional hour.  At times will take a 2nd Ativan  around 3am in hopes of getting more sleep.  Pt reports when she is able to sleep she has 'horrific nightmares'.  Is currently on Sertraline  50mg  daily.    Jaw pain- pt has been seeing oral surgeon and was prescribed Flurbiprofen  for pain and inflammation as they are trying to realign her teeth.  Asking for refill to use prn.   Review of Systems For ROS see HPI     Objective:   Physical Exam Vitals reviewed.  Constitutional:      General: She is not in acute distress.    Appearance: Normal appearance. She is not ill-appearing.  HENT:     Head: Normocephalic and atraumatic.  Eyes:     Extraocular Movements: Extraocular movements intact.     Conjunctiva/sclera: Conjunctivae normal.  Cardiovascular:     Rate and Rhythm: Normal rate and regular rhythm.  Skin:    General: Skin is warm and dry.  Neurological:     General: No focal deficit present.     Mental Status: She is alert and oriented to person, place, and time.  Psychiatric:     Comments: Tearful, anxious           Assessment & Plan:   Jaw pain- new to provider, ongoing for pt.  Will refill Flurbiprofen  to take as needed.

## 2023-12-24 NOTE — Patient Instructions (Addendum)
 Follow up in 3-4 weeks to recheck mood and sleep INCREASE the Sertraline  to 100mg  daily Continue the Ativan  as needed for sleep Call with any questions or concerns Hang in there!

## 2023-12-29 ENCOUNTER — Encounter: Payer: Self-pay | Admitting: Family Medicine

## 2023-12-29 DIAGNOSIS — F419 Anxiety disorder, unspecified: Secondary | ICD-10-CM

## 2023-12-31 MED ORDER — LORAZEPAM 2 MG PO TABS
2.0000 mg | ORAL_TABLET | Freq: Every evening | ORAL | 3 refills | Status: DC | PRN
Start: 2023-12-31 — End: 2024-01-22

## 2023-12-31 NOTE — Telephone Encounter (Signed)
 Patient is requesting refill of Ativan    Requested Prescriptions   Pending Prescriptions Disp Refills   LORazepam  (ATIVAN ) 2 MG tablet 30 tablet 3    Sig: Take 1 tablet (2 mg total) by mouth at bedtime as needed for anxiety or sleep. STOP TEMAZEPAM      Date of patient request: 12/31/2023 Last office visit: 12/24/2023 Upcoming visit: 01/14/2024 Date of last refill: 10/23/2023 Last refill amount: 30 tab 3 refills

## 2024-01-03 ENCOUNTER — Ambulatory Visit: Admitting: Family Medicine

## 2024-01-09 ENCOUNTER — Encounter: Payer: Self-pay | Admitting: Family Medicine

## 2024-01-09 NOTE — Telephone Encounter (Signed)
Please see update from patient.

## 2024-01-14 ENCOUNTER — Ambulatory Visit: Admitting: Family Medicine

## 2024-01-22 MED ORDER — LORAZEPAM 2 MG PO TABS: 2.0000 mg | ORAL_TABLET | Freq: Two times a day (BID) | ORAL | 3 refills | Status: AC

## 2024-01-22 NOTE — Telephone Encounter (Signed)
 Copied from CRM [id number]. Topic: Clinical - Prescription Issue >> Jan 22, 2024 10:15 AM [name] wrote: Reason for CRM: [facility]epam  (ATIVAN ) 2 MG tablet stating it's too early to fill, but patient states provider increased her dosage to 2 tablets a night that's why she is out.  Beth Frey [id number]

## 2024-01-22 NOTE — Telephone Encounter (Signed)
 [facility] and [facility] states pt [facility] 30 day supply 01/03/2024 Did this medication increase at last appt?  Please advise

## 2024-01-22 NOTE — Addendum Note (Signed)
 Addended by: Merikay Lesniewski E on: 01/22/2024 12:18 PM   Modules accepted: Orders

## 2024-02-13 ENCOUNTER — Encounter (HOSPITAL_COMMUNITY): Payer: Self-pay | Admitting: Emergency Medicine

## 2024-02-13 ENCOUNTER — Emergency Department (HOSPITAL_COMMUNITY)

## 2024-02-13 ENCOUNTER — Other Ambulatory Visit: Payer: Self-pay

## 2024-02-13 ENCOUNTER — Emergency Department (HOSPITAL_COMMUNITY)
Admission: EM | Admit: 2024-02-13 | Discharge: 2024-02-14 | Disposition: A | Discharge status: Home / Self Care | Attending: Emergency Medicine | Admitting: Emergency Medicine

## 2024-02-13 DIAGNOSIS — D72829 Elevated white blood cell count, unspecified: Secondary | ICD-10-CM | POA: Diagnosis not present

## 2024-02-13 DIAGNOSIS — Z79899 Other long term (current) drug therapy: Secondary | ICD-10-CM | POA: Insufficient documentation

## 2024-02-13 DIAGNOSIS — F10129 Alcohol abuse with intoxication, unspecified: Secondary | ICD-10-CM | POA: Diagnosis not present

## 2024-02-13 DIAGNOSIS — S0101XA Laceration without foreign body of scalp, initial encounter: Secondary | ICD-10-CM | POA: Insufficient documentation

## 2024-02-13 DIAGNOSIS — S0990XA Unspecified injury of head, initial encounter: Secondary | ICD-10-CM

## 2024-02-13 DIAGNOSIS — F10929 Alcohol use, unspecified with intoxication, unspecified: Secondary | ICD-10-CM | POA: Diagnosis not present

## 2024-02-13 DIAGNOSIS — T07XXXA Unspecified multiple injuries, initial encounter: Secondary | ICD-10-CM | POA: Diagnosis not present

## 2024-02-13 DIAGNOSIS — M1711 Unilateral primary osteoarthritis, right knee: Secondary | ICD-10-CM | POA: Diagnosis not present

## 2024-02-13 DIAGNOSIS — I10 Essential (primary) hypertension: Secondary | ICD-10-CM | POA: Diagnosis not present

## 2024-02-13 DIAGNOSIS — Z743 Need for continuous supervision: Secondary | ICD-10-CM | POA: Diagnosis not present

## 2024-02-13 DIAGNOSIS — W1830XA Fall on same level, unspecified, initial encounter: Secondary | ICD-10-CM | POA: Insufficient documentation

## 2024-02-13 DIAGNOSIS — S81811A Laceration without foreign body, right lower leg, initial encounter: Secondary | ICD-10-CM | POA: Insufficient documentation

## 2024-02-13 DIAGNOSIS — F10920 Alcohol use, unspecified with intoxication, uncomplicated: Secondary | ICD-10-CM

## 2024-02-13 DIAGNOSIS — S0181XA Laceration without foreign body of other part of head, initial encounter: Secondary | ICD-10-CM | POA: Insufficient documentation

## 2024-02-13 DIAGNOSIS — Y908 Blood alcohol level of 240 mg/100 ml or more: Secondary | ICD-10-CM | POA: Diagnosis not present

## 2024-02-13 DIAGNOSIS — M11261 Other chondrocalcinosis, right knee: Secondary | ICD-10-CM | POA: Diagnosis not present

## 2024-02-13 DIAGNOSIS — W19XXXA Unspecified fall, initial encounter: Secondary | ICD-10-CM

## 2024-02-13 DIAGNOSIS — R457 State of emotional shock and stress, unspecified: Secondary | ICD-10-CM | POA: Diagnosis not present

## 2024-02-13 LAB — CBC
HCT: 36.7 % (ref 36.0–46.0)
Hemoglobin: 12.3 g/dL (ref 12.0–15.0)
MCH: 31.4 pg (ref 26.0–34.0)
MCHC: 33.5 g/dL (ref 30.0–36.0)
MCV: 93.6 fL (ref 80.0–100.0)
Platelets: 240 K/uL (ref 150–400)
RBC: 3.92 MIL/uL (ref 3.87–5.11)
RDW: 12.4 % (ref 11.5–15.5)
WBC: 11.5 K/uL — ABNORMAL HIGH (ref 4.0–10.5)
nRBC: 0 % (ref 0.0–0.2)

## 2024-02-13 LAB — BASIC METABOLIC PANEL WITH GFR
Anion gap: 14 (ref 5–15)
BUN: 8 mg/dL (ref 8–23)
CO2: 17 mmol/L — ABNORMAL LOW (ref 22–32)
Calcium: 9.3 mg/dL (ref 8.9–10.3)
Chloride: 106 mmol/L (ref 98–111)
Creatinine, Ser: 0.57 mg/dL (ref 0.44–1.00)
GFR, Estimated: >60 mL/min (ref >60)
Glucose, Bld: 92 mg/dL (ref 70–99)
Potassium: 4 mmol/L (ref 3.5–5.1)
Sodium: 137 mmol/L (ref 135–145)

## 2024-02-13 LAB — ETHANOL: Alcohol, Ethyl (B): 261 mg/dL — ABNORMAL HIGH (ref <15)

## 2024-02-13 MED ORDER — FENTANYL CITRATE PF 50 MCG/ML IJ SOSY
50.0000 ug | PREFILLED_SYRINGE | Freq: Once | INTRAMUSCULAR | Status: AC
  Administered 2024-02-13: 50 ug via INTRAVENOUS
  Filled 2024-02-13: qty 1

## 2024-02-13 MED ORDER — MORPHINE SULFATE (PF) 4 MG/ML IV SOLN
4.0000 mg | Freq: Once | INTRAVENOUS | Status: AC
  Administered 2024-02-13: 4 mg via INTRAVENOUS
  Filled 2024-02-13: qty 1

## 2024-02-13 MED ORDER — SODIUM CHLORIDE 0.9 % IV BOLUS
1000.0000 mL | Freq: Once | INTRAVENOUS | Status: AC
  Administered 2024-02-13: 1000 mL via INTRAVENOUS

## 2024-02-13 MED ORDER — LIDOCAINE-EPINEPHRINE (PF) 2 %-1:200000 IJ SOLN
20.0000 mL | Freq: Once | INTRAMUSCULAR | Status: AC
  Administered 2024-02-13: 20 mL
  Filled 2024-02-13: qty 20

## 2024-02-13 MED ORDER — ONDANSETRON HCL 4 MG/2ML IJ SOLN
4.0000 mg | Freq: Once | INTRAMUSCULAR | Status: AC
Start: 2024-02-13 — End: 2024-02-13
  Administered 2024-02-13: 4 mg via INTRAVENOUS
  Filled 2024-02-13: qty 2
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
## 2024-02-13 NOTE — ED Notes (Signed)
 Pt offered to speak with social worker over phone call, pt appears tearful and reports she is in pain and request to wait to speak with social worker at this time until pain is controlled.
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
## 2024-02-13 NOTE — ED Notes (Signed)
 Pt lacerations to RT calf were covered with non-adherent dressings  Skin tear on RT elbow cleaned with saline and gauze - covered with non adherent dressings and Vaseline gauze

## 2024-02-13 NOTE — ED Notes (Signed)
 Pt observed to flinch when staff provides direct pt care. Pt hums softly and repetitively throughout interaction, appearing anxious or self soothing. RN notified.

## 2024-02-14 MED ORDER — CEPHALEXIN 500 MG PO CAPS: 500.0000 mg | ORAL_CAPSULE | Freq: Four times a day (QID) | ORAL | 0 refills | Status: AC

## 2024-02-14 MED ORDER — HYDROXYZINE HCL 25 MG PO TABS
25.0000 mg | ORAL_TABLET | Freq: Once | ORAL | Status: AC | PRN
  Administered 2024-02-14: 25 mg via ORAL
  Filled 2024-02-14: qty 1

## 2024-02-14 MED ORDER — CEPHALEXIN 250 MG PO CAPS
500.0000 mg | ORAL_CAPSULE | Freq: Once | ORAL | Status: AC
  Administered 2024-02-14: 500 mg via ORAL
  Filled 2024-02-14: qty 2

## 2024-02-14 MED ORDER — ACETAMINOPHEN 325 MG PO TABS
650.0000 mg | ORAL_TABLET | Freq: Four times a day (QID) | ORAL | Status: DC | PRN
  Administered 2024-02-14: 650 mg via ORAL
  Filled 2024-02-14: qty 2
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
## 2024-02-14 NOTE — ED Notes (Addendum)
 Pt requesting pain meds d/t increase of pain. RN and PA notified.
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
## 2024-02-14 NOTE — ED Notes (Signed)
 RN offered tylenol  to patient for pain before discharge, pt refused states she will take it at home.
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
## 2024-02-18 ENCOUNTER — Other Ambulatory Visit: Payer: Self-pay

## 2024-02-18 ENCOUNTER — Emergency Department (HOSPITAL_BASED_OUTPATIENT_CLINIC_OR_DEPARTMENT_OTHER)
Admission: EM | Admit: 2024-02-18 | Discharge: 2024-02-18 | Disposition: A | Discharge status: Home / Self Care | Attending: Emergency Medicine | Admitting: Emergency Medicine

## 2024-02-18 ENCOUNTER — Emergency Department (HOSPITAL_BASED_OUTPATIENT_CLINIC_OR_DEPARTMENT_OTHER): Admitting: Radiology

## 2024-02-18 DIAGNOSIS — J439 Emphysema, unspecified: Secondary | ICD-10-CM | POA: Diagnosis not present

## 2024-02-18 DIAGNOSIS — R079 Chest pain, unspecified: Secondary | ICD-10-CM | POA: Diagnosis not present

## 2024-02-18 DIAGNOSIS — W1830XA Fall on same level, unspecified, initial encounter: Secondary | ICD-10-CM | POA: Diagnosis not present

## 2024-02-18 DIAGNOSIS — M25512 Pain in left shoulder: Secondary | ICD-10-CM | POA: Insufficient documentation

## 2024-02-18 DIAGNOSIS — R0781 Pleurodynia: Secondary | ICD-10-CM | POA: Diagnosis not present

## 2024-02-18 DIAGNOSIS — R0789 Other chest pain: Secondary | ICD-10-CM | POA: Diagnosis not present

## 2024-02-18 DIAGNOSIS — S20212A Contusion of left front wall of thorax, initial encounter: Secondary | ICD-10-CM | POA: Diagnosis not present

## 2024-02-18 DIAGNOSIS — S298XXA Other specified injuries of thorax, initial encounter: Secondary | ICD-10-CM

## 2024-02-18 LAB — BASIC METABOLIC PANEL WITH GFR
Anion gap: 14 (ref 5–15)
BUN: 18 mg/dL (ref 8–23)
CO2: 22 mmol/L (ref 22–32)
Calcium: 10.4 mg/dL — ABNORMAL HIGH (ref 8.9–10.3)
Chloride: 102 mmol/L (ref 98–111)
Creatinine, Ser: 0.59 mg/dL (ref 0.44–1.00)
GFR, Estimated: >60 mL/min (ref >60)
Glucose, Bld: 105 mg/dL — ABNORMAL HIGH (ref 70–99)
Potassium: 4.3 mmol/L (ref 3.5–5.1)
Sodium: 138 mmol/L (ref 135–145)

## 2024-02-18 LAB — CBC
HCT: 39 % (ref 36.0–46.0)
Hemoglobin: 13.1 g/dL (ref 12.0–15.0)
MCH: 31.2 pg (ref 26.0–34.0)
MCHC: 33.6 g/dL (ref 30.0–36.0)
MCV: 92.9 fL (ref 80.0–100.0)
Platelets: 265 K/uL (ref 150–400)
RBC: 4.2 MIL/uL (ref 3.87–5.11)
RDW: 12.4 % (ref 11.5–15.5)
WBC: 8.2 K/uL (ref 4.0–10.5)
nRBC: 0 % (ref 0.0–0.2)

## 2024-02-18 LAB — TROPONIN T, HIGH SENSITIVITY: Troponin T High Sensitivity: <15 ng/L (ref 0–19)

## 2024-02-18 MED ORDER — OXYCODONE HCL 5 MG PO TABS: 2.5000 mg | ORAL_TABLET | ORAL | 0 refills | Status: DC | PRN

## 2024-02-18 NOTE — Discharge Instructions (Addendum)
 Get help right away if: You experience any of the following: Shortness of breath. Pain in your abdomen. Nausea or vomiting. A fever or chills. A rapid heart rate. You cough up blood. You feel dizzy or weak. You faint. You have severe chest pain. This may come with other symptoms, such as: Dizziness. Shortness of breath. Pain in your neck, jaw, or back, or in one arm or both arms. These symptoms may represent a serious problem that is an emergency. Do not wait to see if the symptoms will go away. Get medical help right away. Call your local emergency services (911 in the [place] Do not drive yourself to the hospital.

## 2024-02-18 NOTE — ED Provider Notes (Signed)
 [place] EMERGENCY DEPARTMENT AT [facility] Provider Note   CSN: [id number] Arrival date & time: 02/18/24  0945     Patient presents with: Shortness of Breath   Beth Frey is a 68 y.o. female who returns to the emergency department with chief complaint of left sided rib cage pain.  Patient was seen several days ago after a fall.  She complains of severe pain along the lateral border of her left scapula and left rib cage after her fall.  She states that it hurts if she tries to cough or take a deep breath or lift her arm on that side.  She had take a couple of extra hydrocodone  that she had from an old dental procedure but states that her pain has been very poorly controlled despite that and ice packs.  She denies hemoptysis or severe shortness of breath.  She is having trouble tolerating the pain for sleep at this time and [place]o trouble tolerating ADLs due to the pain in the left side of her rib cage.    Shortness of Breath      Prior to Admission medications   Medication Sig Start Date End Date Taking? Authorizing Provider  celecoxib  (CELEBREX ) 200 MG capsule Take 1 capsule (200 mg total) by mouth daily. 11/21/23   Tabori, Katherine E, MD  cephALEXin  (KEFLEX ) 500 MG capsule Take 1 capsule (500 mg total) by mouth 4 (four) times daily for 5 days. 02/14/24 02/19/24  Pamella Ozell LABOR, DO  EPINEPHrine  0.3 mg/0.3 mL IJ SOAJ injection Inject 0.3 mg into the muscle as needed. 01/25/22   Tabori, Katherine E, MD  flurbiprofen  (ANSAID ) 100 MG tablet Take 1 tablet (100 mg total) by mouth 2 (two) times daily. 12/24/23   Tabori, Katherine E, MD  LORazepam  (ATIVAN ) 2 MG tablet Take 1 tablet (2 mg total) by mouth in the morning and at bedtime. 01/22/24   Tabori, Katherine E, MD  Multiple Vitamin (MULTIVITAMIN WITH MINERALS) TABS tablet Take 1 tablet by mouth daily.    [provider]  Nutritional Supplements (JUICE PLUS FIBRE PO) Take 1 tablet by mouth daily.    [provider]  oxyCODONE  (ROXICODONE ) 5 MG immediate release tablet Take 0.5-1 tablets (2.5-5 mg total) by mouth every 4 (four) hours as needed for severe pain (pain score 7-10). 02/18/24  Yes Shariah Assad, PA-C  potassium chloride  (MICRO-K ) 10 MEQ CR capsule TAKE 1 CAPSULE BY MOUTH THREE TIMES A DAY 11/02/23   Tabori, Katherine E, MD  rosuvastatin  (CRESTOR ) 10 MG tablet TAKE 1 TABLET BY MOUTH EVERY DAY 11/19/23   Tabori, Katherine E, MD  sertraline  (ZOLOFT ) 50 MG tablet Take 50 mg by mouth daily.    [provider]    Allergies: Fish allergy, Sulfa antibiotics, Doxycycline , Remeron [mirtazapine], Trazodone  and nefazodone, and Vancomycin     Review of Systems  Respiratory:  Positive for shortness of breath.     Updated Vital Signs BP (!) 154/90 (BP Location: Right Arm)   Pulse 86   Temp 98.1 F (36.7 C)   Resp 18   LMP  (LMP Unknown)   SpO2 100%   Physical Exam Vitals and nursing note reviewed.  Constitutional:      General: She is not in acute distress.    Appearance: She is well-developed. She is not diaphoretic.  HENT:     Head: Normocephalic and atraumatic.     Right Ear: External ear normal.     Left Ear: External ear normal.  Nose: Nose normal.     Mouth/Throat:     Mouth: Mucous membranes are moist.  Eyes:     General: No scleral icterus.    Conjunctiva/sclera: Conjunctivae normal.  Cardiovascular:     Rate and Rhythm: Normal rate and regular rhythm.     Heart sounds: Normal heart sounds. No murmur heard.    No friction rub. No gallop.  Pulmonary:     Effort: Pulmonary effort is normal. No respiratory distress.     Breath sounds: Normal breath sounds.  Chest:       Comments: Tenderness to palpation along the left lateral chest wall, no crepitus, no displaced or palpable rib fractures Abdominal:     General: Bowel sounds are normal. There is no distension.     Palpations: Abdomen is soft. There is no mass.     Tenderness: There is no abdominal tenderness.  There is no guarding.  Musculoskeletal:       Arms:     Cervical back: Normal range of motion.     Comments:   Tenderness along the left scapula  Skin:    General: Skin is warm and dry.  Neurological:     Mental Status: She is alert and oriented to person, place, and time.  Psychiatric:        Behavior: Behavior normal.     (all labs ordered are listed, but only abnormal results are displayed) Labs Reviewed  BASIC METABOLIC PANEL WITH GFR - Abnormal; Notable for the following components:      Result Value   Glucose, Bld 105 (*)    Calcium  10.4 (*)    All other components within normal limits  CBC  TROPONIN T, HIGH SENSITIVITY    EKG: EKG Interpretation Date/Time:  Monday February 18 2024 09:53:03 EDT Ventricular Rate:  86 PR Interval:  170 QRS Duration:  140 QT Interval:  440 QTC Calculation: 526 R Axis:   -26  Text Interpretation: Normal sinus rhythm Possible Left atrial enlargement Left bundle branch block No significant change since last tracing Confirmed by Jerrol Agent ([id number]) on 02/18/2024 12:15:46 PM  Radiology: DG Chest 2 View Result Date: 02/18/2024 CLINICAL DATA:  Chest pain. EXAM: CHEST - 2 VIEW COMPARISON:  Chest radiograph dated 02/19/2020. FINDINGS: Background of emphysema. No focal consolidation, pleural effusion, or pneumothorax. The cardiac silhouette is within normal limits. No acute osseous pathology. IMPRESSION: No active cardiopulmonary disease. Emphysema. Electronically Signed   By: Vanetta Chou M.D.   On: 02/18/2024 11:14     Procedures   Medications Ordered in the ED - No data to display                                  Medical Decision Making Is a 68 year old female who comes here for chest wall pain after a fall.  She was placed into the ACS algorithm due to her complaint of chest wall pain however her symptoms are not at all consistent with complaint of ACS.  The differential diagnosis includes rib fracture, pneumothorax, subcutaneous  emphysema.  I visualized and interpreted two-view chest x-ray which shows no displaced rib fractures subcutaneous air.  She has tenderness along the left ribcage.   Amount and/or Complexity of Data Reviewed Labs: ordered.    Details: Unremarkable.  Troponin negative Radiology: ordered and independent interpretation performed. ECG/medicine tests: independent interpretation performed.    Details: Sinus rhythm at a rate of 86  Risk  Risk Details: PDMP reviewed this encounter        Final diagnoses:  Rib contusion, left, initial encounter    ED Discharge Orders          Ordered    oxyCODONE  (ROXICODONE ) 5 MG immediate release tablet  Every 4 hours PRN        02/18/24 1234               Arloa Chroman, PA-C 02/18/24 1235    Jerrol Agent, MD 02/18/24 1416

## 2024-02-18 NOTE — ED Triage Notes (Signed)
 C/o difficulties taking deep breath x 4 days. States had a fall and was seen at [facility] on Wednesday.  Reports pain in left rib cage.

## 2024-03-26 ENCOUNTER — Other Ambulatory Visit: Payer: Self-pay

## 2024-04-16 ENCOUNTER — Ambulatory Visit: Admitting: Family Medicine

## 2024-04-17 ENCOUNTER — Encounter: Payer: Self-pay | Admitting: Family Medicine

## 2024-04-17 ENCOUNTER — Ambulatory Visit: Admitting: Family Medicine

## 2024-04-30 ENCOUNTER — Emergency Department (HOSPITAL_COMMUNITY)

## 2024-04-30 ENCOUNTER — Emergency Department (HOSPITAL_COMMUNITY)
Admission: EM | Admit: 2024-04-30 | Discharge: 2024-05-01 | Disposition: A | Discharge status: Home / Self Care | Attending: Emergency Medicine | Admitting: Emergency Medicine

## 2024-04-30 ENCOUNTER — Other Ambulatory Visit: Payer: Self-pay

## 2024-04-30 DIAGNOSIS — F10120 Alcohol abuse with intoxication, uncomplicated: Secondary | ICD-10-CM | POA: Diagnosis not present

## 2024-04-30 DIAGNOSIS — R6884 Jaw pain: Secondary | ICD-10-CM | POA: Insufficient documentation

## 2024-04-30 DIAGNOSIS — S02611A Fracture of condylar process of right mandible, initial encounter for closed fracture: Secondary | ICD-10-CM | POA: Diagnosis not present

## 2024-04-30 DIAGNOSIS — W0110XA Fall on same level from slipping, tripping and stumbling with subsequent striking against unspecified object, initial encounter: Secondary | ICD-10-CM | POA: Insufficient documentation

## 2024-04-30 DIAGNOSIS — S20212A Contusion of left front wall of thorax, initial encounter: Secondary | ICD-10-CM

## 2024-04-30 DIAGNOSIS — Z85819 Personal history of malignant neoplasm of unspecified site of lip, oral cavity, and pharynx: Secondary | ICD-10-CM | POA: Diagnosis not present

## 2024-04-30 DIAGNOSIS — I10 Essential (primary) hypertension: Secondary | ICD-10-CM | POA: Diagnosis not present

## 2024-04-30 DIAGNOSIS — R0781 Pleurodynia: Secondary | ICD-10-CM | POA: Diagnosis present

## 2024-04-30 DIAGNOSIS — S0990XA Unspecified injury of head, initial encounter: Secondary | ICD-10-CM | POA: Diagnosis present

## 2024-04-30 DIAGNOSIS — Z79899 Other long term (current) drug therapy: Secondary | ICD-10-CM | POA: Insufficient documentation

## 2024-04-30 DIAGNOSIS — F1390 Sedative, hypnotic, or anxiolytic use, unspecified, uncomplicated: Secondary | ICD-10-CM | POA: Insufficient documentation

## 2024-04-30 DIAGNOSIS — Y908 Blood alcohol level of 240 mg/100 ml or more: Secondary | ICD-10-CM | POA: Diagnosis not present

## 2024-04-30 DIAGNOSIS — W19XXXA Unspecified fall, initial encounter: Secondary | ICD-10-CM

## 2024-04-30 DIAGNOSIS — S02610A Fracture of condylar process of mandible, unspecified side, initial encounter for closed fracture: Secondary | ICD-10-CM

## 2024-04-30 DIAGNOSIS — F10920 Alcohol use, unspecified with intoxication, uncomplicated: Secondary | ICD-10-CM

## 2024-04-30 DIAGNOSIS — S0081XA Abrasion of other part of head, initial encounter: Secondary | ICD-10-CM | POA: Insufficient documentation

## 2024-04-30 LAB — CBC WITH DIFFERENTIAL/PLATELET
Abs Immature Granulocytes: 0.07 K/uL (ref 0.00–0.07)
Basophils Absolute: 0 K/uL (ref 0.0–0.1)
Basophils Relative: 1 %
Eosinophils Absolute: 0.2 K/uL (ref 0.0–0.5)
Eosinophils Relative: 3 %
HCT: 40 % (ref 36.0–46.0)
Hemoglobin: 13.4 g/dL (ref 12.0–15.0)
Immature Granulocytes: 1 %
Lymphocytes Relative: 34 %
Lymphs Abs: 2.7 K/uL (ref 0.7–4.0)
MCH: 31.4 pg (ref 26.0–34.0)
MCHC: 33.5 g/dL (ref 30.0–36.0)
MCV: 93.7 fL (ref 80.0–100.0)
Monocytes Absolute: 0.6 K/uL (ref 0.1–1.0)
Monocytes Relative: 7 %
Neutro Abs: 4.4 K/uL (ref 1.7–7.7)
Neutrophils Relative %: 54 %
Platelets: 256 K/uL (ref 150–400)
RBC: 4.27 MIL/uL (ref 3.87–5.11)
RDW: 13.1 % (ref 11.5–15.5)
WBC: 8 K/uL (ref 4.0–10.5)
nRBC: 0 % (ref 0.0–0.2)

## 2024-04-30 LAB — BASIC METABOLIC PANEL WITH GFR
Anion gap: 14 (ref 5–15)
BUN: 7 mg/dL — ABNORMAL LOW (ref 8–23)
CO2: 20 mmol/L — ABNORMAL LOW (ref 22–32)
Calcium: 9.6 mg/dL (ref 8.9–10.3)
Chloride: 107 mmol/L (ref 98–111)
Creatinine, Ser: 0.55 mg/dL (ref 0.44–1.00)
GFR, Estimated: >60 mL/min
Glucose, Bld: 96 mg/dL (ref 70–99)
Potassium: 4.1 mmol/L (ref 3.5–5.1)
Sodium: 141 mmol/L (ref 135–145)

## 2024-04-30 LAB — URINE DRUG SCREEN
Amphetamines: NEGATIVE
Barbiturates: NEGATIVE
Benzodiazepines: POSITIVE — AB
Cocaine: NEGATIVE
Fentanyl: NEGATIVE
Methadone Scn, Ur: NEGATIVE
Opiates: NEGATIVE
Tetrahydrocannabinol: NEGATIVE

## 2024-04-30 LAB — ETHANOL: Alcohol, Ethyl (B): 250 mg/dL — ABNORMAL HIGH

## 2024-04-30 MED ORDER — ACETAMINOPHEN 500 MG PO TABS
1000.0000 mg | ORAL_TABLET | Freq: Once | ORAL | Status: AC
  Administered 2024-04-30: 1000 mg via ORAL
  Filled 2024-04-30: qty 2

## 2024-04-30 MED ORDER — FENTANYL CITRATE (PF) 50 MCG/ML IJ SOSY
25.0000 ug | PREFILLED_SYRINGE | Freq: Once | INTRAMUSCULAR | Status: AC
  Administered 2024-04-30: 25 ug via INTRAVENOUS
  Filled 2024-04-30: qty 1

## 2024-04-30 MED ORDER — LACTATED RINGERS IV BOLUS
1000.0000 mL | Freq: Once | INTRAVENOUS | Status: AC
  Administered 2024-04-30: 1000 mL via INTRAVENOUS

## 2024-04-30 MED ORDER — MORPHINE SULFATE (PF) 4 MG/ML IV SOLN
4.0000 mg | Freq: Once | INTRAVENOUS | Status: DC
  Filled 2024-04-30: qty 1

## 2024-04-30 MED ORDER — CYCLOBENZAPRINE HCL 10 MG PO TABS
5.0000 mg | ORAL_TABLET | Freq: Once | ORAL | Status: AC
  Administered 2024-04-30: 5 mg via ORAL
  Filled 2024-04-30: qty 1

## 2024-04-30 MED ORDER — LIDOCAINE 5 % EX PTCH
1.0000 | MEDICATED_PATCH | CUTANEOUS | Status: DC
  Administered 2024-04-30: 1 via TRANSDERMAL
  Filled 2024-04-30: qty 1

## 2024-04-30 MED ORDER — FENTANYL CITRATE (PF) 50 MCG/ML IJ SOSY
50.0000 ug | PREFILLED_SYRINGE | Freq: Once | INTRAMUSCULAR | Status: AC
  Administered 2024-04-30: 50 ug via INTRAVENOUS
  Filled 2024-04-30: qty 1

## 2024-04-30 NOTE — ED Notes (Signed)
 Emergency Contact - Arland ( number in chart) Would like to be called if pt needs anything
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
## 2024-04-30 NOTE — ED Notes (Signed)
 Ambulatory to bathroom with 1 assist due to unsteady gait.  Pt complains of right jaw pain and left rib pain.

## 2024-04-30 NOTE — ED Provider Notes (Signed)
 Care assumed from Dr. Rogelia, patient fall, alcohol intoxication, urine drug screen positive for benzodiazepines. CT of cervical spine and maxillofacial bones are pending. She needs to be observed until she is sober enough to be discharged safely.  CT of cervical spine showed no acute injury.  CT of maxillofacial bones shows a nondisplaced fracture of the mandible on the right condyle.  Fracture is nondisplaced.  Have independently viewed the images, and agree with radiologist's interpretation.  I have advised the patient of the findings, I recommend that she apply ice and limit diet to liquids or, at most, very soft foods.  I am referring her to oral surgery for follow-up.  I am prescribing oxycodone  for pain, but advised her to use over-the-counter acetaminophen  and NSAIDs as her main analgesics and only use oxycodone  when the combination of acetaminophen  and NSAID given sufficient pain relief.  Prior to discharge, she was able to ambulate in the emergency department and was very steady.  Results for orders placed or performed during the hospital encounter of 04/30/24  CBC with Differential   Collection Time: 04/30/24  9:05 PM  Result Value Ref Range   WBC 8.0 4.0 - 10.5 K/uL   RBC 4.27 3.87 - 5.11 MIL/uL   Hemoglobin 13.4 12.0 - 15.0 g/dL   HCT 59.9 63.9 - 53.9 %   MCV 93.7 80.0 - 100.0 fL   MCH 31.4 26.0 - 34.0 pg   MCHC 33.5 30.0 - 36.0 g/dL   RDW 86.8 88.4 - 84.4 %   Platelets 256 150 - 400 K/uL   nRBC 0.0 0.0 - 0.2 %   Neutrophils Relative % 54 %   Neutro Abs 4.4 1.7 - 7.7 K/uL   Lymphocytes Relative 34 %   Lymphs Abs 2.7 0.7 - 4.0 K/uL   Monocytes Relative 7 %   Monocytes Absolute 0.6 0.1 - 1.0 K/uL   Eosinophils Relative 3 %   Eosinophils Absolute 0.2 0.0 - 0.5 K/uL   Basophils Relative 1 %   Basophils Absolute 0.0 0.0 - 0.1 K/uL   Immature Granulocytes 1 %   Abs Immature Granulocytes 0.07 0.00 - 0.07 K/uL  Basic metabolic panel   Collection Time: 04/30/24  9:05 PM  Result  Value Ref Range   Sodium 141 135 - 145 mmol/L   Potassium 4.1 3.5 - 5.1 mmol/L   Chloride 107 98 - 111 mmol/L   CO2 20 (L) 22 - 32 mmol/L   Glucose, Bld 96 70 - 99 mg/dL   BUN 7 (L) 8 - 23 mg/dL   Creatinine, Ser 9.44 0.44 - 1.00 mg/dL   Calcium  9.6 8.9 - 10.3 mg/dL   GFR, Estimated >39 >39 mL/min   Anion gap 14 5 - 15  Ethanol   Collection Time: 04/30/24  9:05 PM  Result Value Ref Range   Alcohol, Ethyl (B) 250 (H) <15 mg/dL  Urine Drug Screen   Collection Time: 04/30/24 10:42 PM  Result Value Ref Range   Opiates NEGATIVE NEGATIVE   Cocaine NEGATIVE NEGATIVE   Benzodiazepines POSITIVE (A) NEGATIVE   Amphetamines NEGATIVE NEGATIVE   Tetrahydrocannabinol NEGATIVE NEGATIVE   Barbiturates NEGATIVE NEGATIVE   Methadone Scn, Ur NEGATIVE NEGATIVE   Fentanyl  NEGATIVE NEGATIVE   CT Cervical Spine Wo Contrast Result Date: 04/30/2024 EXAM: CT CERVICAL SPINE WITHOUT CONTRAST 04/30/2024 11:31:57 PM TECHNIQUE: CT of the cervical spine was performed without the administration of intravenous contrast. Multiplanar reformatted images are provided for review. Automated exposure control, iterative reconstruction, and/or weight based  adjustment of the mA/kV was utilized to reduce the radiation dose to as low as reasonably achievable. COMPARISON: Comparison with 02/13/2024. CLINICAL HISTORY: Neck trauma (Age >= 65y). FINDINGS: BONES AND ALIGNMENT: No acute fracture or traumatic malalignment. DEGENERATIVE CHANGES: Multilevel spondylosis, disc space height loss, and degenerative endplate changes greatest at C5-C6 and C6-C7 where it is moderate to advanced. No severe spinal canal narrowing. SOFT TISSUES: No prevertebral soft tissue swelling. 2.3 cm right thyroid  nodule, previously evaluated with thyroid  6 / 17 / 22 . IMPRESSION: 1. No acute findings. Electronically signed by: Norman Gatlin MD 04/30/2024 11:45 PM EST RP Workstation: [id number]   CT Maxillofacial Wo Contrast Result Date: 04/30/2024 EXAM: CT  OF THE FACE WITHOUT CONTRAST 04/30/2024 11:31:57 PM TECHNIQUE: CT of the face was performed without the administration of intravenous contrast. Multiplanar reformatted images are provided for review. Automated exposure control, iterative reconstruction, and/or weight based adjustment of the mA/kV was utilized to reduce the radiation dose to as low as reasonably achievable. COMPARISON: None available. CLINICAL HISTORY: Facial trauma, blunt; Trauma to inferior aspect of chin, patient also reports history of jaw cancer. FINDINGS: FACIAL BONES: Question nondisplaced fracture of the neck of the condylar process of the right mandible (series 11 image 64 and series 13 image 15). No mandibular dislocation. No suspicious bone lesion. ORBITS: Globes are intact. No acute traumatic injury. No inflammatory change. SINUSES AND MASTOIDS: No acute abnormality. SOFT TISSUES: No acute abnormality. IMPRESSION: 1. Possible nondisplaced fracture of the neck of the condylar process of the right mandible. Correlate with site of pain. Electronically signed by: Norman Gatlin MD 04/30/2024 11:43 PM EST RP Workstation: [id number]   CT Head Wo Contrast Result Date: 04/30/2024 EXAM: CT HEAD WITHOUT CONTRAST 04/30/2024 11:31:57 PM TECHNIQUE: CT of the head was performed without the administration of intravenous contrast. Automated exposure control, iterative reconstruction, and/or weight based adjustment of the mA/kV was utilized to reduce the radiation dose to as low as reasonably achievable. COMPARISON: 02/13/2024 CLINICAL HISTORY: Head trauma, minor (Age >= 65y) FINDINGS: BRAIN AND VENTRICLES: No acute hemorrhage. No evidence of acute infarct. No hydrocephalus. No extra-axial collection. No mass effect or midline shift. Prominent perivascular space in inferior left basal ganglia. ORBITS: No acute abnormality. SINUSES: Mucosal thickening in bilateral ethmoid, sphenoid, and maxillary sinuses. SOFT TISSUES AND SKULL: No acute soft tissue  abnormality. No skull fracture. IMPRESSION: 1. No acute intracranial abnormality. Electronically signed by: Norman Gatlin MD 04/30/2024 11:37 PM EST RP Workstation: [id number]   DG Pelvis Portable Result Date: 04/30/2024 EXAM: 1 or 2 VIEW(S) XRAY OF THE PELVIS 04/30/2024 09:16:00 PM COMPARISON: None available. CLINICAL HISTORY: fall, left rib pain FINDINGS: BONES AND JOINTS: No acute fracture. No malalignment. Degenerative changes of the hips. SOFT TISSUES: Right lower quadrant bowel surgical sutures noted. The soft tissues are unremarkable. IMPRESSION: 1. No acute findings. Electronically signed by: Norman Gatlin MD 04/30/2024 09:22 PM EST RP Workstation: [id number]   DG Chest Portable 1 View Result Date: 04/30/2024 EXAM: 1 VIEW(S) XRAY OF THE CHEST 04/30/2024 09:16:00 PM COMPARISON: 02/18/2024 CLINICAL HISTORY: fall, left rib pain FINDINGS: LUNGS AND PLEURA: Low lung volumes. No focal pulmonary opacity. No pleural effusion. No pneumothorax. HEART AND MEDIASTINUM: Atherosclerotic plaque noted. No acute abnormality of the cardiac and mediastinal silhouettes. BONES AND SOFT TISSUES: Old healed right seventh rib fracture. IMPRESSION: 1. No acute findings. Electronically signed by: Norman Gatlin MD 04/30/2024 09:21 PM EST RP Workstation: [id number]      Raford Lenis, MD 05/01/24 [phone]

## 2024-05-01 MED ORDER — OXYCODONE HCL 5 MG PO TABS: 5.0000 mg | ORAL_TABLET | ORAL | 0 refills | Status: DC | PRN

## 2024-05-01 NOTE — ED Notes (Signed)
Ambulatory to bathroom with minimal assist

## 2024-05-01 NOTE — Discharge Instructions (Addendum)
 Please stay on a liquid to very soft diet until you are cleared to eat more solid food by the oral surgeon.  Apply ice for 30 minutes at a time, 4 times a day.  Take acetaminophen  and/or ibuprofen  as needed for pain.  Please be aware that the combination of acetaminophen  and ibuprofen  gives you better pain relief than either medication by itself.  If you still need additional pain relief, add oxycodone .  Please limit your alcohol consumption to no more than 2 drinks a day.  Drink would be 12 ounces of beer, 6 ounces of wine, or 1 ounce of hard liquor.

## 2024-05-05 ENCOUNTER — Encounter: Payer: Self-pay | Admitting: Family Medicine

## 2024-05-05 ENCOUNTER — Ambulatory Visit: Admitting: Family Medicine

## 2024-05-05 VITALS — BP 122/70 | HR 80 | Temp 98.0°F | Ht 67.5 in | Wt 147.0 lb

## 2024-05-05 DIAGNOSIS — F10920 Alcohol use, unspecified with intoxication, uncomplicated: Secondary | ICD-10-CM

## 2024-05-05 DIAGNOSIS — F419 Anxiety disorder, unspecified: Secondary | ICD-10-CM | POA: Diagnosis not present

## 2024-05-05 DIAGNOSIS — E782 Mixed hyperlipidemia: Secondary | ICD-10-CM

## 2024-05-05 DIAGNOSIS — S02611D Fracture of condylar process of right mandible, subsequent encounter for fracture with routine healing: Secondary | ICD-10-CM | POA: Diagnosis not present

## 2024-05-05 LAB — LIPID PANEL
Cholesterol: 218 mg/dL — ABNORMAL HIGH (ref 28–200)
HDL: 110.5 mg/dL
LDL Cholesterol: 88 mg/dL (ref 10–99)
NonHDL: 107.58
Total CHOL/HDL Ratio: 2
Triglycerides: 99 mg/dL (ref 10.0–149.0)
VLDL: 19.8 mg/dL (ref 0.0–40.0)

## 2024-05-05 LAB — HEPATIC FUNCTION PANEL
ALT: 37 U/L — ABNORMAL HIGH (ref 3–35)
AST: 43 U/L — ABNORMAL HIGH (ref 5–37)
Albumin: 4.6 g/dL (ref 3.5–5.2)
Alkaline Phosphatase: 39 U/L (ref 39–117)
Bilirubin, Direct: 0.1 mg/dL (ref 0.1–0.3)
Total Bilirubin: 0.7 mg/dL (ref 0.2–1.2)
Total Protein: 7.4 g/dL (ref 6.0–8.3)

## 2024-05-05 LAB — BASIC METABOLIC PANEL WITH GFR
BUN: 14 mg/dL (ref 6–23)
CO2: 26 meq/L (ref 19–32)
Calcium: 9.9 mg/dL (ref 8.4–10.5)
Chloride: 102 meq/L (ref 96–112)
Creatinine, Ser: 0.75 mg/dL (ref 0.40–1.20)
GFR: 82.26 mL/min
Glucose, Bld: 107 mg/dL — ABNORMAL HIGH (ref 70–99)
Potassium: 4.4 meq/L (ref 3.5–5.1)
Sodium: 139 meq/L (ref 135–145)

## 2024-05-05 LAB — CBC WITH DIFFERENTIAL/PLATELET
Basophils Absolute: 0 K/uL (ref 0.0–0.1)
Basophils Relative: 0.5 % (ref 0.0–3.0)
Eosinophils Absolute: 0.3 K/uL (ref 0.0–0.7)
Eosinophils Relative: 4.1 % (ref 0.0–5.0)
HCT: 40 % (ref 36.0–46.0)
Hemoglobin: 13.5 g/dL (ref 12.0–15.0)
Lymphocytes Relative: 27.2 % (ref 12.0–46.0)
Lymphs Abs: 2 K/uL (ref 0.7–4.0)
MCHC: 33.8 g/dL (ref 30.0–36.0)
MCV: 92.3 fl (ref 78.0–100.0)
Monocytes Absolute: 0.6 K/uL (ref 0.1–1.0)
Monocytes Relative: 8.8 % (ref 3.0–12.0)
Neutro Abs: 4.3 K/uL (ref 1.4–7.7)
Neutrophils Relative %: 59.4 % (ref 43.0–77.0)
Platelets: 231 K/uL (ref 150.0–400.0)
RBC: 4.34 Mil/uL (ref 3.87–5.11)
RDW: 13.1 % (ref 11.5–15.5)
WBC: 7.3 K/uL (ref 4.0–10.5)

## 2024-05-05 LAB — TSH: TSH: 2.4 u[IU]/mL (ref 0.35–5.50)

## 2024-05-05 NOTE — Progress Notes (Unsigned)
" ° °  Subjective:    Patient ID: Beth Frey, female    DOB: 08-29-1955, 68 y.o.   MRN: [id number]  HPI Hyperlipidemia- chronic problem.  On Crestor  10mg  daily.  Denies CP, SOB, abd pain, N/V.  ER f/u- pt went to ER on 12/24 after falling getting out of the shower.  At the time she was intoxicated w/ ETOH level of 250.  All imaging was unremarkable w/ exception of maxillofacial CT that showed nondisplaced fx of R mandibular condyle.  She was referred to oral surgery.  Of note, had a prior fall in October w/ elevated ETOH level.  Pt denies needing alcohol tx at this time.  Has not had a drink since 12/24.  States she 'gets amnesia' and 'i forget how much I've had to drink'.  Not interested in counseling.  'i just gotta take control'.   Review of Systems For ROS see HPI     Objective:   Physical Exam Vitals reviewed.  Constitutional:      General: She is not in acute distress.    Appearance: She is well-developed. She is not ill-appearing.  HENT:     Head: Normocephalic and atraumatic.  Eyes:     Conjunctiva/sclera: Conjunctivae normal.     Pupils: Pupils are equal, round, and reactive to light.  Neck:     Thyroid : No thyromegaly.  Cardiovascular:     Rate and Rhythm: Normal rate and regular rhythm.     Heart sounds: Normal heart sounds. No murmur heard. Pulmonary:     Effort: Pulmonary effort is normal. No respiratory distress.     Breath sounds: Normal breath sounds.  Abdominal:     General: There is no distension.     Palpations: Abdomen is soft.     Tenderness: There is no abdominal tenderness.  Musculoskeletal:     Cervical back: Normal range of motion and neck supple.  Lymphadenopathy:     Cervical: No cervical adenopathy.  Skin:    General: Skin is warm and dry.     Findings: Bruising (under chin and down anterior neck) present.  Neurological:     General: No focal deficit present.     Mental Status: She is alert and oriented to person, place, and time.  Psychiatric:      Comments: Anxious, tearful           Assessment & Plan:  Episodic Alcohol Intoxication- new.  Pt has had 2 episodes since October, both resulting in falls and ER visits.  Both times, alcohol level has been significantly elevated.  She reports she loses track of how much she has had to drink and doesn't realize until she is heavily intoxicated.  Discussed rehab- pt declined.  Discussed drug and alcohol counseling- pt declined.  States she has not had a drink since her most recent episode and plans to stay sober.  Will follow.  Closed fx of mandibular condyle- new.  S/p fall 2/2 alcohol intoxication.  Has upcoming appt w/ oral surgery. "

## 2024-05-05 NOTE — Patient Instructions (Addendum)
 Schedule your complete physical in 6 months We'll notify you of your lab results and make any changes if needed We'll call you to schedule your counseling appt Let me know when you are interested in having your hearing evaluated and we can place the referral AVOID alcohol- particularly if taking the Lorazepam  Call with any questions or concerns Stay Safe!  Stay Healthy! Happy New Year!!

## 2024-05-06 ENCOUNTER — Encounter: Payer: Self-pay | Admitting: Family Medicine

## 2024-05-06 MED ORDER — SERTRALINE HCL 100 MG PO TABS: 100.0000 mg | ORAL_TABLET | Freq: Every day | ORAL | 3 refills | Status: AC

## 2024-05-06 MED ORDER — SERTRALINE HCL 50 MG PO TABS: 50.0000 mg | ORAL_TABLET | Freq: Every day | ORAL | 1 refills | Status: DC

## 2024-05-06 NOTE — Assessment & Plan Note (Signed)
 Ongoing issue.  Currently on Sertraline  100mg  daily.  Not interested in medication changes at this time.  Encouraged her to consider counseling and she is open to the idea.  Referral placed.  Stressed she cannot take the Lorazepam  and drink alcohol.  Will follow.

## 2024-05-06 NOTE — Assessment & Plan Note (Signed)
 Chronic problem.  On Crestor  10mg  daily w/o difficulty.  Check labs.  Adjust meds prn

## 2024-05-06 NOTE — Telephone Encounter (Signed)
 Patient is wanting a refill on her 100mg  Zoloft . We have on file that she is taking 50mg . Okay to refill under your name?

## 2024-05-07 ENCOUNTER — Ambulatory Visit: Payer: Self-pay | Admitting: Family Medicine

## 2024-05-07 DIAGNOSIS — R7989 Other specified abnormal findings of blood chemistry: Secondary | ICD-10-CM

## 2024-05-07 NOTE — Progress Notes (Signed)
 Scheduled 2 week lab appt  Ordered future labs  Patient verbalized understanding of labs

## 2024-05-12 ENCOUNTER — Encounter: Payer: Self-pay | Admitting: Family Medicine

## 2024-05-12 NOTE — Telephone Encounter (Signed)
 Patient saw you on 05/05/24 for fracture. She is wanting to change medication?

## 2024-05-14 MED ORDER — HYDROCODONE-ACETAMINOPHEN 5-325 MG PO TABS: 1.0000 | ORAL_TABLET | Freq: Four times a day (QID) | ORAL | 0 refills | Status: AC | PRN

## 2024-05-14 NOTE — Telephone Encounter (Signed)
 Patient notes she does not have any more Oxycodone  notes they only gave her 15  Noted in chart she was dispensed Oxycodone  5 mg IR Qt 20 tablets on 05/01/2024 and there were no refills on this prescription. Patient would like additional medication. Please advise

## 2024-05-14 NOTE — Addendum Note (Signed)
 Addended by: Nolan Tuazon E on: 05/14/2024 12:38 PM   Modules accepted: Orders

## 2024-05-15 ENCOUNTER — Other Ambulatory Visit: Payer: Self-pay | Admitting: Family Medicine

## 2024-05-21 ENCOUNTER — Other Ambulatory Visit (HOSPITAL_BASED_OUTPATIENT_CLINIC_OR_DEPARTMENT_OTHER): Payer: Self-pay | Admitting: Family Medicine

## 2024-05-21 DIAGNOSIS — Z1231 Encounter for screening mammogram for malignant neoplasm of breast: Secondary | ICD-10-CM

## 2024-05-22 ENCOUNTER — Encounter: Payer: Self-pay | Admitting: Family Medicine

## 2024-05-22 ENCOUNTER — Other Ambulatory Visit (INDEPENDENT_AMBULATORY_CARE_PROVIDER_SITE_OTHER)

## 2024-05-22 DIAGNOSIS — R7989 Other specified abnormal findings of blood chemistry: Secondary | ICD-10-CM

## 2024-05-22 LAB — HEPATIC FUNCTION PANEL
ALT: 54 U/L — ABNORMAL HIGH (ref 3–35)
AST: 23 U/L (ref 5–37)
Albumin: 4.7 g/dL (ref 3.5–5.2)
Alkaline Phosphatase: 37 U/L — ABNORMAL LOW (ref 39–117)
Bilirubin, Direct: 0.1 mg/dL (ref 0.1–0.3)
Total Bilirubin: 0.6 mg/dL (ref 0.2–1.2)
Total Protein: 7.2 g/dL (ref 6.0–8.3)

## 2024-05-22 NOTE — Telephone Encounter (Signed)
 FYI

## 2024-05-22 NOTE — Telephone Encounter (Signed)
 Pt called in to check the status of this medication as she had another dental procedure this morning and never received the medication. She states she was told by the pharmacy that they never received the script but I informed pt that it was confirmed and they should have it. I told her to call the pharmacy to verify and if she has any issues to give us  a call back.

## 2024-05-23 ENCOUNTER — Ambulatory Visit: Payer: Self-pay | Admitting: Family Medicine

## 2024-05-23 DIAGNOSIS — R7989 Other specified abnormal findings of blood chemistry: Secondary | ICD-10-CM

## 2024-06-03 ENCOUNTER — Ambulatory Visit (HOSPITAL_BASED_OUTPATIENT_CLINIC_OR_DEPARTMENT_OTHER)

## 2024-06-11 ENCOUNTER — Ambulatory Visit (HOSPITAL_BASED_OUTPATIENT_CLINIC_OR_DEPARTMENT_OTHER)
Admission: RE | Admit: 2024-06-11 | Discharge: 2024-06-11 | Disposition: A | Source: Ambulatory Visit | Attending: Family Medicine | Admitting: Family Medicine

## 2024-06-11 ENCOUNTER — Ambulatory Visit: Payer: Self-pay | Admitting: Family Medicine

## 2024-06-11 DIAGNOSIS — R7989 Other specified abnormal findings of blood chemistry: Secondary | ICD-10-CM

## 2024-06-16 ENCOUNTER — Inpatient Hospital Stay (HOSPITAL_BASED_OUTPATIENT_CLINIC_OR_DEPARTMENT_OTHER): Admission: RE | Admit: 2024-06-16 | Source: Ambulatory Visit

## 2024-10-22 ENCOUNTER — Encounter: Admitting: Family Medicine

## 2024-11-25 ENCOUNTER — Encounter
